# Patient Record
Sex: Female | Born: 1965
Health system: Southern US, Community
[De-identification: ages and names within clinical notes are randomized; demographics above are authoritative.]

## PROBLEM LIST (undated history)

## (undated) DIAGNOSIS — G4730 Sleep apnea, unspecified: Secondary | ICD-10-CM

## (undated) DIAGNOSIS — E119 Type 2 diabetes mellitus without complications: Secondary | ICD-10-CM

## (undated) DIAGNOSIS — F329 Major depressive disorder, single episode, unspecified: Secondary | ICD-10-CM

## (undated) DIAGNOSIS — I639 Cerebral infarction, unspecified: Secondary | ICD-10-CM

## (undated) DIAGNOSIS — Z8619 Personal history of other infectious and parasitic diseases: Secondary | ICD-10-CM

## (undated) DIAGNOSIS — B009 Herpesviral infection, unspecified: Secondary | ICD-10-CM

## (undated) DIAGNOSIS — F32A Depression, unspecified: Secondary | ICD-10-CM

## (undated) DIAGNOSIS — F419 Anxiety disorder, unspecified: Secondary | ICD-10-CM

## (undated) HISTORY — DX: Depression, unspecified: F32.A

## (undated) HISTORY — DX: Anxiety disorder, unspecified: F41.9

## (undated) HISTORY — PX: OTHER SURGICAL HISTORY: SHX169

## (undated) HISTORY — PX: FOOT SURGERY: SHX648

## (undated) HISTORY — DX: Major depressive disorder, single episode, unspecified: F32.9

## (undated) HISTORY — DX: Herpesviral infection, unspecified: B00.9

## (undated) HISTORY — DX: Sleep apnea, unspecified: G47.30

## (undated) HISTORY — PX: ABDOMINAL HYSTERECTOMY: SHX81

## (undated) HISTORY — DX: Personal history of other infectious and parasitic diseases: Z86.19

## (undated) HISTORY — PX: TOTAL ABDOMINAL HYSTERECTOMY: SHX209

---

## 2015-06-04 ENCOUNTER — Emergency Department (HOSPITAL_BASED_OUTPATIENT_CLINIC_OR_DEPARTMENT_OTHER)
Admission: EM | Admit: 2015-06-04 | Discharge: 2015-06-04 | Disposition: A | Discharge status: Other Acute Inpatient Hospital | Payer: Self-pay | Attending: Emergency Medicine | Admitting: Emergency Medicine

## 2015-06-04 ENCOUNTER — Emergency Department (HOSPITAL_BASED_OUTPATIENT_CLINIC_OR_DEPARTMENT_OTHER): Payer: Self-pay

## 2015-06-04 ENCOUNTER — Encounter (HOSPITAL_BASED_OUTPATIENT_CLINIC_OR_DEPARTMENT_OTHER): Payer: Self-pay | Admitting: Emergency Medicine

## 2015-06-04 DIAGNOSIS — Z79899 Other long term (current) drug therapy: Secondary | ICD-10-CM | POA: Insufficient documentation

## 2015-06-04 DIAGNOSIS — E1165 Type 2 diabetes mellitus with hyperglycemia: Secondary | ICD-10-CM | POA: Insufficient documentation

## 2015-06-04 DIAGNOSIS — Z87891 Personal history of nicotine dependence: Secondary | ICD-10-CM | POA: Insufficient documentation

## 2015-06-04 DIAGNOSIS — G459 Transient cerebral ischemic attack, unspecified: Secondary | ICD-10-CM | POA: Insufficient documentation

## 2015-06-04 DIAGNOSIS — K5900 Constipation, unspecified: Secondary | ICD-10-CM | POA: Insufficient documentation

## 2015-06-04 DIAGNOSIS — R739 Hyperglycemia, unspecified: Secondary | ICD-10-CM

## 2015-06-04 HISTORY — DX: Type 2 diabetes mellitus without complications: E11.9

## 2015-06-04 HISTORY — DX: Cerebral infarction, unspecified: I63.9

## 2015-06-04 LAB — COMPREHENSIVE METABOLIC PANEL
ALK PHOS: 79 U/L (ref 38–126)
ALT: 24 U/L (ref 14–54)
AST: 20 U/L (ref 15–41)
Albumin: 3.8 g/dL (ref 3.5–5.0)
Anion gap: 11 (ref 5–15)
BILIRUBIN TOTAL: 0.6 mg/dL (ref 0.3–1.2)
BUN: 24 mg/dL — ABNORMAL HIGH (ref 6–20)
CALCIUM: 9.9 mg/dL (ref 8.9–10.3)
CO2: 26 mmol/L (ref 22–32)
CREATININE: 1.04 mg/dL — AB (ref 0.44–1.00)
Chloride: 93 mmol/L — ABNORMAL LOW (ref 101–111)
GFR calc non Af Amer: >60 mL/min (ref >60)
Glucose, Bld: 440 mg/dL — ABNORMAL HIGH (ref 65–99)
Potassium: 5.1 mmol/L (ref 3.5–5.1)
SODIUM: 130 mmol/L — AB (ref 135–145)
TOTAL PROTEIN: 7.5 g/dL (ref 6.5–8.1)

## 2015-06-04 LAB — CBC
HEMATOCRIT: 37.1 % (ref 36.0–46.0)
HEMOGLOBIN: 11.8 g/dL — AB (ref 12.0–15.0)
MCH: 26.3 pg (ref 26.0–34.0)
MCHC: 31.8 g/dL (ref 30.0–36.0)
MCV: 82.6 fL (ref 78.0–100.0)
PLATELETS: 264 10*3/uL (ref 150–400)
RBC: 4.49 MIL/uL (ref 3.87–5.11)
RDW: 12.8 % (ref 11.5–15.5)
WBC: 7.9 10*3/uL (ref 4.0–10.5)

## 2015-06-04 LAB — URINALYSIS, ROUTINE W REFLEX MICROSCOPIC
Bilirubin Urine: NEGATIVE
Glucose, UA: >1000 mg/dL — AB
Hgb urine dipstick: NEGATIVE
KETONES UR: NEGATIVE mg/dL
NITRITE: NEGATIVE
PROTEIN: NEGATIVE mg/dL
Specific Gravity, Urine: 1.023 (ref 1.005–1.030)
Urobilinogen, UA: 0.2 mg/dL (ref 0.0–1.0)
pH: 6.5 (ref 5.0–8.0)

## 2015-06-04 LAB — RAPID URINE DRUG SCREEN, HOSP PERFORMED
Amphetamines: NOT DETECTED
BARBITURATES: NOT DETECTED
Benzodiazepines: NOT DETECTED
COCAINE: NOT DETECTED
Opiates: NOT DETECTED
Tetrahydrocannabinol: NOT DETECTED

## 2015-06-04 LAB — DIFFERENTIAL
Basophils Absolute: 0 10*3/uL (ref 0.0–0.1)
Basophils Relative: 0 % (ref 0–1)
EOS PCT: 1 % (ref 0–5)
Eosinophils Absolute: 0.1 10*3/uL (ref 0.0–0.7)
LYMPHS ABS: 3.1 10*3/uL (ref 0.7–4.0)
LYMPHS PCT: 39 % (ref 12–46)
MONO ABS: 0.5 10*3/uL (ref 0.1–1.0)
MONOS PCT: 7 % (ref 3–12)
NEUTROS ABS: 4.2 10*3/uL (ref 1.7–7.7)
Neutrophils Relative %: 53 % (ref 43–77)

## 2015-06-04 LAB — PROTIME-INR
INR: 0.83 (ref 0.00–1.49)
Prothrombin Time: 11.7 seconds (ref 11.6–15.2)

## 2015-06-04 LAB — TROPONIN I

## 2015-06-04 LAB — ETHANOL: Alcohol, Ethyl (B): <5 mg/dL (ref <5)

## 2015-06-04 LAB — URINE MICROSCOPIC-ADD ON

## 2015-06-04 LAB — APTT: aPTT: 22 seconds — ABNORMAL LOW (ref 24–37)

## 2015-06-04 MED ORDER — SODIUM CHLORIDE 0.9 % IV BOLUS (SEPSIS)
500.0000 mL | Freq: Once | INTRAVENOUS | Status: AC
  Administered 2015-06-04: 500 mL via INTRAVENOUS

## 2015-06-04 NOTE — ED Notes (Signed)
Left leg numbness and left sided HA x1 hr.  Nausea. Constipation x1 week.

## 2015-06-04 NOTE — ED Notes (Signed)
Patient transported to CT 

## 2015-06-04 NOTE — ED Provider Notes (Signed)
CSN: [id number]     Arrival date & time 06/04/15  1628 History   First MD Initiated Contact with Patient 06/04/15 1640     Chief Complaint  Patient presents with  . Numbness     (Consider location/radiation/quality/duration/timing/severity/associated sxs/prior Treatment) The history is provided by the patient.   patient presents with complaint of numbness. States she driven order mom's house and her right foot and lower leg felt numb. States she had some difficulty walking with it. States he does not feel like it was asleep. Did not hurt. No weakness of the leg but states she had difficulty walking. Also had a sharp headache at that time. States that it was on the left temporal area. It was sharp and lasted a minute or 2. Has been with more dull. States she's had a previous stroke that had some left-sided weakness. States she had a stroke because she is diabetic. Also states she has had constipation over the last week. States she's had some abdominal pain with it. No fevers. States she had a little chill when she tried to go to the bathroom. Also states she gets the feeling food get caught in her esophagus. No weight loss.  Past Medical History  Diagnosis Date  . Stroke   . Diabetes mellitus without complication    Past Surgical History  Procedure Laterality Date  . Abdominal hysterectomy    . Foot surgery     No family history on file. Social History  Substance Use Topics  . Smoking status: Former Research scientist (life sciences)  . Smokeless tobacco: None  . Alcohol Use: None     Comment: socially   OB History    No data available     Review of Systems  Constitutional: Negative for activity change and appetite change.  Eyes: Negative for pain.  Respiratory: Negative for chest tightness and shortness of breath.   Cardiovascular: Negative for chest pain and leg swelling.  Gastrointestinal: Positive for abdominal pain and constipation. Negative for nausea, vomiting and diarrhea.  Genitourinary:  Negative for flank pain.  Musculoskeletal: Negative for back pain and neck stiffness.  Skin: Negative for rash.  Neurological: Negative for weakness, numbness and headaches.  Psychiatric/Behavioral: Negative for behavioral problems.      Allergies  Review of patient's allergies indicates no known allergies.  Home Medications   Prior to Admission medications   Medication Sig Start Date End Date Taking? Authorizing Provider  gabapentin (NEURONTIN) 300 MG capsule Take 300 mg by mouth 3 (three) times daily.   Yes Historical Provider, MD  glipiZIDE (GLUCOTROL) 5 MG tablet Take by mouth daily before breakfast.   Yes Historical Provider, MD  lisinopril (PRINIVIL,ZESTRIL) 5 MG tablet Take 5 mg by mouth daily.   Yes Historical Provider, MD  magnesium 30 MG tablet Take 30 mg by mouth 2 (two) times daily.   Yes Historical Provider, MD  metFORMIN (GLUCOPHAGE) 1000 MG tablet Take 1,000 mg by mouth 2 (two) times daily with a meal.   Yes Historical Provider, MD  traZODone (DESYREL) 50 MG tablet Take 50 mg by mouth at bedtime.   Yes Historical Provider, MD   BP 142/76 mmHg  Pulse 102  Temp(Src) 98.2 F (36.8 C) (Oral)  Resp 16  Ht 5' 7.5" (1.715 m)  Wt 180 lb (81.647 kg)  BMI 27.76 kg/m2  SpO2 100% Physical Exam  Constitutional: She is oriented to person, place, and time. She appears well-developed and well-nourished.  HENT:  Head: Atraumatic.  Neck: Neck supple.  Cardiovascular:  Normal rate.   Pulmonary/Chest: Effort normal.  Abdominal: There is no tenderness.  Neurological: She is alert and oriented to person, place, and time. No cranial nerve deficit.  Finger-nose intact bilaterally. Face symmetric. External ocular movements intact. Good grip strength bilaterally. Good flexion-extension at hips knees and ankles. Some paresthesias to the left foot. Not clearly over a dermatomal distribution.  Skin: Skin is warm.  Psychiatric: She has a normal mood and affect.    ED Course  Procedures  (including critical care time) Labs Review Labs Reviewed  APTT - Abnormal; Notable for the following:    aPTT 22 (*)    All other components within normal limits  CBC - Abnormal; Notable for the following:    Hemoglobin 11.8 (*)    All other components within normal limits  COMPREHENSIVE METABOLIC PANEL - Abnormal; Notable for the following:    Sodium 130 (*)    Chloride 93 (*)    Glucose, Bld 440 (*)    BUN 24 (*)    Creatinine, Ser 1.04 (*)    All other components within normal limits  URINALYSIS, ROUTINE W REFLEX MICROSCOPIC (NOT AT [facility]) - Abnormal; Notable for the following:    APPearance CLOUDY (*)    Glucose, UA >1000 (*)    Leukocytes, UA SMALL (*)    All other components within normal limits  URINE MICROSCOPIC-ADD ON - Abnormal; Notable for the following:    Squamous Epithelial / LPF MANY (*)    All other components within normal limits  ETHANOL  PROTIME-INR  DIFFERENTIAL  URINE RAPID DRUG SCREEN, HOSP PERFORMED  TROPONIN I    Imaging Review Ct Head Wo Contrast  06/04/2015   CLINICAL DATA:  Left leg weakness.  EXAM: CT HEAD WITHOUT CONTRAST  TECHNIQUE: Contiguous axial images were obtained from the base of the skull through the vertex without intravenous contrast.  COMPARISON:  None.  FINDINGS: There is no evidence of mass effect, midline shift, or extra-axial fluid collections. There is no evidence of a space-occupying lesion or intracranial hemorrhage. There is no evidence of a cortical-based area of acute infarction. There is an old right frontal lobe infarct with encephalomalacia.  The ventricles and sulci are appropriate for the patient's age. The basal cisterns are patent.  Visualized portions of the orbits are unremarkable. The visualized portions of the paranasal sinuses and mastoid air cells are unremarkable. Cerebrovascular atherosclerotic calcifications are noted.  The osseous structures are unremarkable.  IMPRESSION: 1. No acute intracranial pathology. 2. Old  right frontal lobe infarct.   Electronically Signed   By: Kathreen Devoid   On: 06/04/2015 17:17   Dg Abd Acute W/chest  06/04/2015   CLINICAL DATA:  Constipation for 1 week.  Nausea, vomiting  EXAM: DG ABDOMEN ACUTE W/ 1V CHEST  COMPARISON:  None.  FINDINGS: There is no evidence of dilated bowel loops or free intraperitoneal air. No radiopaque calculi or other significant radiographic abnormality is seen. Heart size and mediastinal contours are within normal limits. Both lungs are clear.  IMPRESSION: Negative abdominal radiographs.  No acute cardiopulmonary disease.   Electronically Signed   By: Kathreen Devoid   On: 06/04/2015 17:25   I have personally reviewed and evaluated these images and lab results as part of my medical decision-making.   EKG Interpretation   Date/Time:  Wednesday June 04 2015 16:53:05 EDT Ventricular Rate:  108 PR Interval:  134 QRS Duration: 74 QT Interval:  328 QTC Calculation: 439 R Axis:   67 Text  Interpretation:  Sinus tachycardia Otherwise normal ECG Confirmed by  Alvino Chapel  MD, Ovid Curd [phone]) on 06/04/2015 11:32:47 PM      MDM   Final diagnoses:  Transient cerebral ischemia, unspecified transient cerebral ischemia type  Hyperglycemia  Constipation, unspecified constipation type    Patient with mild numbness. Somewhat variable exam. Has had previous stroke. Also had some constipation. Will transfer to [facility] for further evaluation.    Davonna Belling, MD 06/04/15 [phone]

## 2015-06-04 NOTE — ED Notes (Signed)
MD at bedside. 

## 2015-11-24 DIAGNOSIS — E1142 Type 2 diabetes mellitus with diabetic polyneuropathy: Secondary | ICD-10-CM | POA: Insufficient documentation

## 2015-11-24 DIAGNOSIS — E1165 Type 2 diabetes mellitus with hyperglycemia: Secondary | ICD-10-CM | POA: Insufficient documentation

## 2015-11-24 DIAGNOSIS — R000 Tachycardia, unspecified: Secondary | ICD-10-CM | POA: Insufficient documentation

## 2016-03-10 DIAGNOSIS — E1169 Type 2 diabetes mellitus with other specified complication: Secondary | ICD-10-CM | POA: Insufficient documentation

## 2016-03-18 DIAGNOSIS — E1142 Type 2 diabetes mellitus with diabetic polyneuropathy: Secondary | ICD-10-CM | POA: Insufficient documentation

## 2016-03-18 DIAGNOSIS — E1129 Type 2 diabetes mellitus with other diabetic kidney complication: Secondary | ICD-10-CM | POA: Insufficient documentation

## 2017-02-22 DIAGNOSIS — F411 Generalized anxiety disorder: Secondary | ICD-10-CM | POA: Insufficient documentation

## 2017-04-07 ENCOUNTER — Encounter (HOSPITAL_BASED_OUTPATIENT_CLINIC_OR_DEPARTMENT_OTHER): Payer: Self-pay

## 2017-04-07 ENCOUNTER — Emergency Department (HOSPITAL_BASED_OUTPATIENT_CLINIC_OR_DEPARTMENT_OTHER): Payer: Managed Care, Other (non HMO)

## 2017-04-07 ENCOUNTER — Emergency Department (HOSPITAL_BASED_OUTPATIENT_CLINIC_OR_DEPARTMENT_OTHER)
Admission: EM | Admit: 2017-04-07 | Discharge: 2017-04-07 | Disposition: A | Discharge status: Home / Self Care | Payer: Managed Care, Other (non HMO) | Attending: Emergency Medicine | Admitting: Emergency Medicine

## 2017-04-07 DIAGNOSIS — Z79899 Other long term (current) drug therapy: Secondary | ICD-10-CM | POA: Insufficient documentation

## 2017-04-07 DIAGNOSIS — E1165 Type 2 diabetes mellitus with hyperglycemia: Secondary | ICD-10-CM | POA: Insufficient documentation

## 2017-04-07 DIAGNOSIS — Z8673 Personal history of transient ischemic attack (TIA), and cerebral infarction without residual deficits: Secondary | ICD-10-CM | POA: Insufficient documentation

## 2017-04-07 DIAGNOSIS — R0602 Shortness of breath: Secondary | ICD-10-CM | POA: Insufficient documentation

## 2017-04-07 DIAGNOSIS — Z794 Long term (current) use of insulin: Secondary | ICD-10-CM | POA: Insufficient documentation

## 2017-04-07 DIAGNOSIS — Z87891 Personal history of nicotine dependence: Secondary | ICD-10-CM | POA: Diagnosis not present

## 2017-04-07 DIAGNOSIS — R000 Tachycardia, unspecified: Secondary | ICD-10-CM | POA: Diagnosis present

## 2017-04-07 DIAGNOSIS — R739 Hyperglycemia, unspecified: Secondary | ICD-10-CM

## 2017-04-07 LAB — CBC WITH DIFFERENTIAL/PLATELET
BASOS ABS: 0 10*3/uL (ref 0.0–0.1)
Basophils Relative: 0 %
EOS ABS: 0.1 10*3/uL (ref 0.0–0.7)
EOS PCT: 1 %
HCT: 33.9 % — ABNORMAL LOW (ref 36.0–46.0)
Hemoglobin: 11.1 g/dL — ABNORMAL LOW (ref 12.0–15.0)
Lymphocytes Relative: 32 %
Lymphs Abs: 2.4 10*3/uL (ref 0.7–4.0)
MCH: 26.3 pg (ref 26.0–34.0)
MCHC: 32.7 g/dL (ref 30.0–36.0)
MCV: 80.3 fL (ref 78.0–100.0)
MONO ABS: 0.4 10*3/uL (ref 0.1–1.0)
Monocytes Relative: 5 %
Neutro Abs: 4.6 10*3/uL (ref 1.7–7.7)
Neutrophils Relative %: 62 %
PLATELETS: 301 10*3/uL (ref 150–400)
RBC: 4.22 MIL/uL (ref 3.87–5.11)
RDW: 14.2 % (ref 11.5–15.5)
WBC: 7.5 10*3/uL (ref 4.0–10.5)

## 2017-04-07 LAB — TROPONIN I

## 2017-04-07 LAB — CBG MONITORING, ED
GLUCOSE-CAPILLARY: 207 mg/dL — AB (ref 65–99)
Glucose-Capillary: 221 mg/dL — ABNORMAL HIGH (ref 65–99)

## 2017-04-07 LAB — D-DIMER, QUANTITATIVE: D-Dimer, Quant: 0.38 ug/mL-FEU (ref 0.00–0.50)

## 2017-04-07 MED ORDER — SODIUM CHLORIDE 0.9 % IV BOLUS (SEPSIS)
1000.0000 mL | Freq: Once | INTRAVENOUS | Status: AC
  Administered 2017-04-07: 1000 mL via INTRAVENOUS

## 2017-04-07 MED ORDER — IOPAMIDOL (ISOVUE-370) INJECTION 76%
100.0000 mL | Freq: Once | INTRAVENOUS | Status: AC | PRN
  Administered 2017-04-07: 100 mL via INTRAVENOUS

## 2017-04-07 MED ORDER — INSULIN ASPART 100 UNIT/ML ~~LOC~~ SOLN
10.0000 [IU] | Freq: Once | SUBCUTANEOUS | Status: AC
  Administered 2017-04-07: 10 [IU] via INTRAVENOUS
  Filled 2017-04-07: qty 1

## 2017-04-07 MED ORDER — LACTATED RINGERS IV BOLUS (SEPSIS): 1000.0000 mL | Freq: Once | INTRAVENOUS | Status: DC

## 2017-04-07 NOTE — ED Provider Notes (Signed)
[facility] Provider Note   CSN: [id number] Arrival date & time: 04/07/17  1336     History   Chief Complaint Chief Complaint  Patient presents with  . Tachycardia    HPI Rindy Roseman is a 51 y.o. female.  Patient is a 51 year old female with past medical history of diabetes, stroke. She presents for evaluation of elevated heart rate and shortness of breath. She states that she noticed her heart racing several days ago and has not improved. She becomes short of breath when she walks or becomes excited. She denies any fevers, chills, or productive cough. She denies any significant leg swelling.   The history is provided by the patient.    Past Medical History:  Diagnosis Date  . Diabetes mellitus without complication ([place])   . Stroke [facility])     There are no active problems to display for this patient.   Past Surgical History:  Procedure Laterality Date  . ABDOMINAL HYSTERECTOMY    . FOOT SURGERY      OB History    No data available       Home Medications    Prior to Admission medications   Medication Sig Start Date End Date Taking? Authorizing Provider  Insulin Aspart (NOVOLOG [place]) Inject into the skin.   Yes [provider]  Insulin Glargine (TOUJEO SOLOSTAR [place]) Inject into the skin.   Yes [provider]  gabapentin (NEURONTIN) 300 MG capsule Take 300 mg by mouth 3 (three) times daily.    [provider]  lisinopril (PRINIVIL,ZESTRIL) 5 MG tablet Take 5 mg by mouth daily.    [provider]  traZODone (DESYREL) 50 MG tablet Take 50 mg by mouth at bedtime.    [provider]    Family History No family history on file.  Social History Social History  Substance Use Topics  . Smoking status: Former Research scientist (life sciences)  . Smokeless tobacco: Never Used  . Alcohol use Yes     Comment: occ     Allergies   Latex   Review of Systems Review of Systems  All other systems reviewed and are  negative.    Physical Exam Updated Vital Signs BP 103/78 (BP Location: Left Arm)   Pulse (!) 124   Temp 98.1 F (36.7 C) (Oral)   Resp 18   Ht 5\' 8"  (1.727 m)   Wt 97.1 kg (214 lb)   SpO2 97%   BMI 32.54 kg/m   Physical Exam  Constitutional: She is oriented to person, place, and time. She appears well-developed and well-nourished. No distress.  HENT:  Head: Normocephalic and atraumatic.  Neck: Normal range of motion. Neck supple.  Cardiovascular: Regular rhythm and normal heart sounds.  Exam reveals no gallop and no friction rub.   No murmur heard. Heart is regular but tachycardic.  Pulmonary/Chest: Effort normal and breath sounds normal. No respiratory distress. She has no wheezes. She has no rales.  Abdominal: Soft. Bowel sounds are normal. She exhibits no distension. There is no tenderness.  Musculoskeletal: Normal range of motion.  Neurological: She is alert and oriented to person, place, and time.  Skin: Skin is warm and dry. She is not diaphoretic.  Nursing note and vitals reviewed.    ED Treatments / Results  Labs (all labs ordered are listed, but only abnormal results are displayed) Labs Reviewed  COMPREHENSIVE METABOLIC PANEL  CBC WITH DIFFERENTIAL/PLATELET  TROPONIN I  D-DIMER, QUANTITATIVE (NOT AT [facility])    EKG  EKG Interpretation  Date/Time:  Thursday April 07 2017 13:43:08 EDT Ventricular Rate:  125 PR Interval:  136 QRS Duration: 74 QT Interval:  312 QTC Calculation: 450 R Axis:   61 Text Interpretation:  Sinus tachycardia Possible Left atrial enlargement Cannot rule out Anterior infarct , age undetermined Abnormal ECG Confirmed by Veryl Speak [phone]) on 04/07/2017 1:54:09 PM       Radiology No results found.  Procedures Procedures (including critical care time)  Medications Ordered in ED Medications  sodium chloride 0.9 % bolus 1,000 mL (not administered)     Initial Impression / Assessment and Plan / ED Course  I have reviewed the  triage vital signs and the nursing notes.  Pertinent labs & imaging results that were available during my care of the patient were reviewed by me and considered in my medical decision making (see chart for details).  Patient presents with elevated heart rate and shortness of breath that has been worsening over the past several days. Her workup thus far reveals a mildly elevated d-dimer and blood sugar that is significantly elevated with no evidence for DKA. She will be given IV fluids and insulin, and will undergo a CT angiogram of the chest to rule out pulmonary embolism.  Care will be signed out at the time of this dictation to Dr. Dayna Barker. He will obtain the results of the CT scan and determine the final disposition.   Final Clinical Impressions(s) / ED Diagnoses   Final diagnoses:  None    New Prescriptions New Prescriptions   No medications on file     Veryl Speak, MD 04/18/17 [phone]

## 2017-04-07 NOTE — ED Notes (Signed)
Pt verbalizes understanding of d/c instructions and denies any further needs at this time. 

## 2017-04-07 NOTE — ED Notes (Signed)
Critical lab result of glucose >500 reported to primary rn and md

## 2017-04-07 NOTE — ED Triage Notes (Signed)
C/o HR 130s and SOB x 1 week-hx of same with no dx-pt NAD-entered triage with steady gait chewing gum

## 2017-04-08 LAB — COMPREHENSIVE METABOLIC PANEL
ALK PHOS: 195 U/L — AB (ref 38–126)
ALT: 41 U/L (ref 14–54)
AST: 36 U/L (ref 15–41)
Albumin: 3.4 g/dL — ABNORMAL LOW (ref 3.5–5.0)
Anion gap: 10 (ref 5–15)
BILIRUBIN TOTAL: 0.8 mg/dL (ref 0.3–1.2)
BUN: 22 mg/dL — AB (ref 6–20)
CO2: 25 mmol/L (ref 22–32)
CREATININE: 1.2 mg/dL — AB (ref 0.44–1.00)
Calcium: 8.9 mg/dL (ref 8.9–10.3)
Chloride: 94 mmol/L — ABNORMAL LOW (ref 101–111)
GFR calc Af Amer: 59 mL/min — ABNORMAL LOW (ref >60)
GFR, EST NON AFRICAN AMERICAN: 51 mL/min — AB (ref >60)
GLUCOSE: 517 mg/dL — AB (ref 65–99)
Potassium: 5 mmol/L (ref 3.5–5.1)
Sodium: 129 mmol/L — ABNORMAL LOW (ref 135–145)
TOTAL PROTEIN: 7.3 g/dL (ref 6.5–8.1)

## 2017-04-09 NOTE — ED Provider Notes (Signed)
7:03 AM Assumed care from Dr. Stark Jock, please see their note for full history, physical and decision making until this point. In brief this is a 52 y.o. year old female who presented to the ED tonight with Tachycardia     Here with hyperglycemia and tachycardia likely 2/2 dehydration, pending hydration and cta and otherwise stable for dc.   HR improved. Glucose improved. CT ok. Patient without complaints still slightly tachy but no other symptoms at this time.   Discharge instructions, including strict return precautions for new or worsening symptoms, given. Patient and/or family verbalized understanding and agreement with the plan as described.   Labs, studies and imaging reviewed by myself and considered in medical decision making if ordered. Imaging interpreted by radiology.  Labs Reviewed  COMPREHENSIVE METABOLIC PANEL - Abnormal; Notable for the following:       Result Value   Sodium 129 (*)    Chloride 94 (*)    Glucose, Bld 517 (*)    BUN 22 (*)    Creatinine, Ser 1.20 (*)    Albumin 3.4 (*)    Alkaline Phosphatase 195 (*)    GFR calc non Af Amer 51 (*)    GFR calc Af Amer 59 (*)    All other components within normal limits  CBC WITH DIFFERENTIAL/PLATELET - Abnormal; Notable for the following:    Hemoglobin 11.1 (*)    HCT 33.9 (*)    All other components within normal limits  CBG MONITORING, ED - Abnormal; Notable for the following:    Glucose-Capillary 221 (*)    All other components within normal limits  CBG MONITORING, ED - Abnormal; Notable for the following:    Glucose-Capillary 207 (*)    All other components within normal limits  TROPONIN I  D-DIMER, QUANTITATIVE (NOT AT [facility])    CT Angio Chest PE W and/or Wo Contrast  Final Result      No Follow-up on file.    Velinda Wrobel, Corene Cornea, MD 04/09/17 [phone]

## 2017-04-11 DIAGNOSIS — R748 Abnormal levels of other serum enzymes: Secondary | ICD-10-CM | POA: Insufficient documentation

## 2017-04-11 DIAGNOSIS — D638 Anemia in other chronic diseases classified elsewhere: Secondary | ICD-10-CM | POA: Insufficient documentation

## 2017-05-16 DIAGNOSIS — R072 Precordial pain: Secondary | ICD-10-CM | POA: Insufficient documentation

## 2017-05-31 DIAGNOSIS — K760 Fatty (change of) liver, not elsewhere classified: Secondary | ICD-10-CM | POA: Insufficient documentation

## 2017-07-11 ENCOUNTER — Encounter: Payer: Self-pay | Admitting: Gastroenterology

## 2017-07-12 ENCOUNTER — Encounter (HOSPITAL_BASED_OUTPATIENT_CLINIC_OR_DEPARTMENT_OTHER): Payer: Self-pay | Admitting: *Deleted

## 2017-07-12 ENCOUNTER — Emergency Department (HOSPITAL_BASED_OUTPATIENT_CLINIC_OR_DEPARTMENT_OTHER)
Admission: EM | Admit: 2017-07-12 | Discharge: 2017-07-12 | Disposition: A | Discharge status: Home / Self Care | Payer: Managed Care, Other (non HMO) | Attending: Emergency Medicine | Admitting: Emergency Medicine

## 2017-07-12 DIAGNOSIS — E119 Type 2 diabetes mellitus without complications: Secondary | ICD-10-CM | POA: Insufficient documentation

## 2017-07-12 DIAGNOSIS — E860 Dehydration: Secondary | ICD-10-CM | POA: Diagnosis not present

## 2017-07-12 DIAGNOSIS — R197 Diarrhea, unspecified: Secondary | ICD-10-CM | POA: Insufficient documentation

## 2017-07-12 DIAGNOSIS — Z91040 Latex allergy status: Secondary | ICD-10-CM | POA: Insufficient documentation

## 2017-07-12 LAB — CBC WITH DIFFERENTIAL/PLATELET
Basophils Absolute: 0 10*3/uL (ref 0.0–0.1)
Basophils Relative: 0 %
Eosinophils Absolute: 0.1 10*3/uL (ref 0.0–0.7)
Eosinophils Relative: 1 %
HEMATOCRIT: 33.3 % — AB (ref 36.0–46.0)
Hemoglobin: 10.3 g/dL — ABNORMAL LOW (ref 12.0–15.0)
LYMPHS ABS: 2.4 10*3/uL (ref 0.7–4.0)
Lymphocytes Relative: 32 %
MCH: 25.8 pg — ABNORMAL LOW (ref 26.0–34.0)
MCHC: 30.9 g/dL (ref 30.0–36.0)
MCV: 83.3 fL (ref 78.0–100.0)
MONOS PCT: 5 %
Monocytes Absolute: 0.4 10*3/uL (ref 0.1–1.0)
NEUTROS ABS: 4.8 10*3/uL (ref 1.7–7.7)
NEUTROS PCT: 62 %
Platelets: 346 10*3/uL (ref 150–400)
RBC: 4 MIL/uL (ref 3.87–5.11)
RDW: 14.8 % (ref 11.5–15.5)
WBC: 7.6 10*3/uL (ref 4.0–10.5)

## 2017-07-12 LAB — COMPREHENSIVE METABOLIC PANEL
ALT: 25 U/L (ref 14–54)
ANION GAP: 5 (ref 5–15)
AST: 25 U/L (ref 15–41)
Albumin: 3.5 g/dL (ref 3.5–5.0)
Alkaline Phosphatase: 137 U/L — ABNORMAL HIGH (ref 38–126)
BILIRUBIN TOTAL: 0.4 mg/dL (ref 0.3–1.2)
BUN: 19 mg/dL (ref 6–20)
CO2: 25 mmol/L (ref 22–32)
Calcium: 8.7 mg/dL — ABNORMAL LOW (ref 8.9–10.3)
Chloride: 106 mmol/L (ref 101–111)
Creatinine, Ser: 1.08 mg/dL — ABNORMAL HIGH (ref 0.44–1.00)
GFR, EST NON AFRICAN AMERICAN: 58 mL/min — AB (ref >60)
Glucose, Bld: 72 mg/dL (ref 65–99)
POTASSIUM: 3.8 mmol/L (ref 3.5–5.1)
Sodium: 136 mmol/L (ref 135–145)
TOTAL PROTEIN: 7.7 g/dL (ref 6.5–8.1)

## 2017-07-12 LAB — LIPASE, BLOOD: LIPASE: 42 U/L (ref 11–51)

## 2017-07-12 MED ORDER — DICYCLOMINE HCL 20 MG PO TABS: 20.0000 mg | ORAL_TABLET | Freq: Two times a day (BID) | ORAL | 0 refills | Status: DC

## 2017-07-12 MED ORDER — LOPERAMIDE HCL 2 MG PO CAPS: 2.0000 mg | ORAL_CAPSULE | Freq: Four times a day (QID) | ORAL | 0 refills | Status: DC | PRN

## 2017-07-12 MED FILL — SM ANTI-DIARRHEAL 2 MG CAPL: 2 | 12 days supply | Qty: 48 | Fill #0

## 2017-07-12 NOTE — ED Notes (Signed)
ED Provider at bedside. 

## 2017-07-12 NOTE — ED Notes (Signed)
Pt stated that her abdominal bloating gets worst and she felt like stabbing al-over her body.  Her  Lower back also hurts since her abdomen becomes bigger.

## 2017-07-12 NOTE — ED Triage Notes (Signed)
Abdominal bloating and diarrhea x 2 months. She is being seen by numerous doctors and they cannot find a cause.

## 2017-07-12 NOTE — ED Provider Notes (Signed)
Emergency Department Provider Note   I have reviewed the triage vital signs and the nursing notes.   HISTORY  Chief Complaint Diarrhea   HPI Helen Perez is a 51 y.o. female with PMH of DM and CVA presents to the emergency department for evaluation of worsening abdominal bloating with associated diarrhea. Symptoms have been ongoing for the past 2 months. Patient states that she has seen multiple prior physicians with no definitive diagnosis made. She reports her most recent colonoscopy in March of this year which was prior to symptom onset. She also had an upper endoscopy around the same time. She reports presumed normal CT abdomen/pelvis and stool cultures performed by her PCP. She describes one to 3 episodes of nonbloody diarrhea daily with associated sharp pains on the right side of her abdomen. She has some associated discomfort in the lower abdomen. No fevers or chills. Denies any vaginal bleeding or discharge. No dysuria, hesitancy, urgency. Denies sudden worsening of symptoms today but states she was scheduled to see a provider at [facility] but they called today to cancel and reschedule for December.    Past Medical History:  Diagnosis Date  . Diabetes mellitus without complication ([place])   . Stroke [facility])     There are no active problems to display for this patient.   Past Surgical History:  Procedure Laterality Date  . ABDOMINAL HYSTERECTOMY    . FOOT SURGERY      Current Outpatient Rx  . Order #: [id number] Class: Historical Med  . Order #: [id number] Class: Historical Med  . Order #: [id number] Class: Historical Med  . Order #: [id number] Class: Historical Med  . Order #: [id number] Class: Historical Med  . Order #: [id number] Class: Historical Med  . Order #: [id number] Class: Historical Med  . Order #: [id number] Class: Historical Med  . Order #: [id number] Class: Historical Med  . Order #: [id number] Class: Historical Med  . Order #: [id number] Class: Print  . Order #:  [id number] Class: Historical Med  . Order #: [id number] Class: Print    Allergies Latex  No family history on file.  Social History Social History  Substance Use Topics  . Smoking status: Former Research scientist (life sciences)  . Smokeless tobacco: Never Used  . Alcohol use Yes     Comment: occ    Review of Systems  Constitutional: No fever/chills Eyes: No visual changes. ENT: No sore throat. Cardiovascular: Denies chest pain. Respiratory: Denies shortness of breath. Gastrointestinal: Positive right abdominal pain.  No nausea, no vomiting. Positive diarrhea.  No constipation. Genitourinary: Negative for dysuria. Musculoskeletal: Negative for back pain. Skin: Negative for rash. Neurological: Negative for headaches, focal weakness or numbness.  10-point ROS otherwise negative.  ____________________________________________   PHYSICAL EXAM:  VITAL SIGNS: ED Triage Vitals  Enc Vitals Group     BP 07/12/17 1329 128/77     Pulse Rate 07/12/17 1329 (!) 109     Resp 07/12/17 1329 18     Temp 07/12/17 1329 98.7 F (37.1 C)     Temp Source 07/12/17 1329 Oral     SpO2 07/12/17 1329 98 %     Weight 07/12/17 1324 210 lb (95.3 kg)     Height 07/12/17 1324 5\' 8"  (1.727 m)     Pain Score 07/12/17 1323 7   Constitutional: Alert and oriented. Well appearing and in no acute distress. Eyes: Conjunctivae are normal.  Head: Atraumatic. Nose: No congestion/rhinnorhea. Mouth/Throat: Mucous membranes are moist.   Neck: No stridor.  Cardiovascular: Tachycardia. Good peripheral circulation. Grossly normal heart sounds.  Respiratory: Normal respiratory effort.  No retractions. Lungs CTAB. Gastrointestinal: Soft and nontender. No distention.  Musculoskeletal: No lower extremity tenderness nor edema. No gross deformities of extremities. Neurologic:  Normal speech and language. No gross focal neurologic deficits are appreciated.  Skin:  Skin is warm, dry and intact. No rash  noted.   ____________________________________________   LABS (all labs ordered are listed, but only abnormal results are displayed)  Labs Reviewed  CBC WITH DIFFERENTIAL/PLATELET - Abnormal; Notable for the following:       Result Value   Hemoglobin 10.3 (*)    HCT 33.3 (*)    MCH 25.8 (*)    All other components within normal limits  COMPREHENSIVE METABOLIC PANEL - Abnormal; Notable for the following:    Creatinine, Ser 1.08 (*)    Calcium 8.7 (*)    Alkaline Phosphatase 137 (*)    GFR calc non Af Amer 58 (*)    All other components within normal limits  LIPASE, BLOOD    ____________________________________________   PROCEDURES  Procedure(s) performed:   Procedures  None ____________________________________________   INITIAL IMPRESSION / ASSESSMENT AND PLAN / ED COURSE  Pertinent labs & imaging results that were available during my care of the patient were reviewed by me and considered in my medical decision making (see chart for details).  Patient resents to the emergency department for evaluation of abdominal bloating with ongoing diarrhea for the past 2 months. Symptoms are now worse today but she was scheduled to see an outpatient provider when that appointment was canceled suddenly she began to feel frustrated by ongoing symptoms and so presented to the emergency department. Patient has had an extensive outpatient workup to this point and has been on medication for IBS with no relief. Reports trying Immodium in the past with no improvement. Abdomen is soft and diffusely nontender. She has no signs or symptoms to suggest acute small bowel obstruction. Plan for labs to screen for electrolyte abnormality and reassess.   Electrolytes unremarkable. Plan for GI and PCP follow up. Discharged with Rx for Bentyl and plan to re-start immodium.   At this time, I do not feel there is any life-threatening condition present. I have reviewed and discussed all results (EKG,  imaging, lab, urine as appropriate), exam findings with patient. I have reviewed nursing notes and appropriate previous records.  I feel the patient is safe to be discharged home without further emergent workup. Discussed usual and customary return precautions. Patient and family (if present) verbalize understanding and are comfortable with this plan.  Patient will follow-up with their primary care provider. If they do not have a primary care provider, information for follow-up has been provided to them. All questions have been answered.  ____________________________________________  FINAL CLINICAL IMPRESSION(S) / ED DIAGNOSES  Final diagnoses:  Diarrhea, unspecified type  Dehydration     MEDICATIONS GIVEN DURING THIS VISIT:  Medications - No data to display   NEW OUTPATIENT MEDICATIONS STARTED DURING THIS VISIT:  Discharge Medication List as of 07/12/2017  3:15 PM    START taking these medications   Details  dicyclomine (BENTYL) 20 MG tablet Take 1 tablet (20 mg total) by mouth 2 (two) times daily., Starting Tue 07/12/2017, Print    loperamide (IMODIUM) 2 MG capsule Take 1 capsule (2 mg total) by mouth 4 (four) times daily as needed for diarrhea or loose stools., Starting Tue 07/12/2017, Print        Note:  This document was prepared using Dragon voice recognition  software and may include unintentional dictation errors.  Nanda Quinton, MD Emergency Medicine    Amaree Loisel, [facility], MD 07/13/17 1154

## 2017-07-12 NOTE — Discharge Instructions (Signed)
You were seen in the ED today with continued diarrhea. We encourage you to take over-the-counter loperamide (Imodium) according to the label instructions.  Please also read through the information below about foods that are rich in potassium and foods that can help with diarrhea.  We encourage you to drink plenty of fluids (water or Gatorade) and eat according to the instruction recommendations to help resolve your symptoms.  Return to the emergency department with new or worsening symptoms that concern you.   Diarrhea Diarrhea is frequent loose and watery bowel movements. It can cause you to feel weak and dehydrated. Dehydration can cause you to become tired and thirsty, have a dry mouth, and have decreased urination that often is dark yellow. Diarrhea is a sign of another problem, most often an infection that will not last Helen Perez. In most cases, diarrhea typically lasts 2-3 days. However, it can last longer if it is a sign of something more serious. It is important to treat your diarrhea as directed by your caregiver to lessen or prevent future episodes of diarrhea. CAUSES  Some common causes include: Gastrointestinal infections caused by viruses, bacteria, or parasites. Food poisoning or food allergies. Certain medicines, such as antibiotics, chemotherapy, and laxatives. Artificial sweeteners and fructose. Digestive disorders. HOME CARE INSTRUCTIONS Ensure adequate fluid intake (hydration): Have 1 cup (8 oz) of fluid for each diarrhea episode. Avoid fluids that contain simple sugars or sports drinks, fruit juices, whole milk products, and sodas. Your urine should be clear or pale yellow if you are drinking enough fluids. Hydrate with an oral rehydration solution that you can purchase at pharmacies, retail stores, and online. You can prepare an oral rehydration solution at home by mixing the following ingredients together:  - tsp table salt.  tsp baking soda.  tsp salt substitute containing  potassium chloride. 1  tablespoons sugar. 1 L (34 oz) of water. Certain foods and beverages may increase the speed at which food moves through the gastrointestinal (GI) tract. These foods and beverages should be avoided and include: Caffeinated and alcoholic beverages. High-fiber foods, such as raw fruits and vegetables, nuts, seeds, and whole grain breads and cereals. Foods and beverages sweetened with sugar alcohols, such as xylitol, sorbitol, and mannitol. Some foods may be well tolerated and may help thicken stool including: Starchy foods, such as rice, toast, pasta, low-sugar cereal, oatmeal, grits, baked potatoes, crackers, and bagels. Bananas. Applesauce. Add probiotic-rich foods to help increase healthy bacteria in the GI tract, such as yogurt and fermented milk products. Wash your hands well after each diarrhea episode. Only take over-the-counter or prescription medicines as directed by your caregiver. Take a warm bath to relieve any burning or pain from frequent diarrhea episodes. SEEK IMMEDIATE MEDICAL CARE IF:  You are unable to keep fluids down. You have persistent vomiting. You have blood in your stool, or your stools are black and tarry. You do not urinate in 6-8 hours, or there is only a small amount of very dark urine. You have abdominal pain that increases or localizes. You have weakness, dizziness, confusion, or light-headedness. You have a severe headache. Your diarrhea gets worse or does not get better. You have a fever or persistent symptoms for more than 2-3 days. You have a fever and your symptoms suddenly get worse. MAKE SURE YOU:  Understand these instructions. Will watch your condition. Will get help right away if you are not doing well or get worse.   This information is not intended to replace advice given to  you by your health care provider. Make sure you discuss any questions you have with your health care provider.   Document Released: 09/24/2002  Document Revised: 10/25/2014 Document Reviewed: 06/11/2012 Elsevier Interactive Patient Education 2016 [place] Choices to Help Relieve Diarrhea, Adult When you have diarrhea, the foods you eat and your eating habits are very important. Choosing the right foods and drinks can help relieve diarrhea. Also, because diarrhea can last up to 7 days, you need to replace lost fluids and electrolytes (such as sodium, potassium, and chloride) in order to help prevent dehydration.  WHAT GENERAL GUIDELINES DO I NEED TO FOLLOW? Slowly drink 1 cup (8 oz) of fluid for each episode of diarrhea. If you are getting enough fluid, your urine will be clear or pale yellow. Eat starchy foods. Some good choices include white rice, white toast, pasta, low-fiber cereal, baked potatoes (without the skin), saltine crackers, and bagels. Avoid large servings of any cooked vegetables. Limit fruit to two servings per day. A serving is  cup or 1 small piece. Choose foods with less than 2 g of fiber per serving. Limit fats to less than 8 tsp (38 g) per day. Avoid fried foods. Eat foods that have probiotics in them. Probiotics can be found in certain dairy products. Avoid foods and beverages that may increase the speed at which food moves through the stomach and intestines (gastrointestinal tract). Things to avoid include: High-fiber foods, such as dried fruit, raw fruits and vegetables, nuts, seeds, and whole grain foods. Spicy foods and high-fat foods. Foods and beverages sweetened with high-fructose corn syrup, honey, or sugar alcohols such as xylitol, sorbitol, and mannitol. WHAT FOODS ARE RECOMMENDED? Grains White rice. White, [place], or pita breads (fresh or toasted), including plain rolls, buns, or bagels. White pasta. Saltine, soda, or graham crackers. Pretzels. Low-fiber cereal. Cooked cereals made with water (such as cornmeal, farina, or cream cereals). Plain muffins. Matzo. Melba toast. Zwieback.    Vegetables Potatoes (without the skin). Strained tomato and vegetable juices. Most well-cooked and canned vegetables without seeds. Tender lettuce. Fruits Cooked or canned applesauce, apricots, cherries, fruit cocktail, grapefruit, peaches, pears, or plums. Fresh bananas, apples without skin, cherries, grapes, cantaloupe, grapefruit, peaches, oranges, or plums.  Meat and Other Protein Products Baked or boiled chicken. Eggs. Tofu. Fish. Seafood. Smooth peanut butter. Ground or well-cooked tender beef, ham, veal, lamb, pork, or poultry.  Dairy Plain yogurt, kefir, and unsweetened liquid yogurt. Lactose-free milk, buttermilk, or soy milk. Plain hard cheese. Beverages Sport drinks. Clear broths. Diluted fruit juices (except prune). Regular, caffeine-free sodas such as ginger ale. Water. Decaffeinated teas. Oral rehydration solutions. Sugar-free beverages not sweetened with sugar alcohols. Other Bouillon, broth, or soups made from recommended foods.  The items listed above may not be a complete list of recommended foods or beverages. Contact your dietitian for more options. WHAT FOODS ARE NOT RECOMMENDED? Grains Whole grain, whole wheat, bran, or rye breads, rolls, pastas, crackers, and cereals. Wild or brown rice. Cereals that contain more than 2 g of fiber per serving. Corn tortillas or taco shells. Cooked or dry oatmeal. Granola. Popcorn. Vegetables Raw vegetables. Cabbage, broccoli, Brussels sprouts, artichokes, baked beans, beet greens, corn, kale, legumes, peas, sweet potatoes, and yams. Potato skins. Cooked spinach and cabbage. Fruits Dried fruit, including raisins and dates. Raw fruits. Stewed or dried prunes. Fresh apples with skin, apricots, mangoes, pears, raspberries, and strawberries.  Meat and Other Protein Products Chunky peanut butter. Nuts and seeds. Beans and lentils. Berniece Salines.  Dairy High-fat cheeses. Milk, chocolate milk, and beverages made with milk, such as milk shakes. Cream.  Ice cream. Sweets and Desserts Sweet rolls, doughnuts, and sweet breads. Pancakes and waffles. Fats and Oils Butter. Cream sauces. Margarine. Salad oils. Plain salad dressings. Olives. Avocados.  Beverages Caffeinated beverages (such as coffee, tea, soda, or energy drinks). Alcoholic beverages. Fruit juices with pulp. Prune juice. Soft drinks sweetened with high-fructose corn syrup or sugar alcohols. Other Coconut. Hot sauce. Chili powder. Mayonnaise. Gravy. Cream-based or milk-based soups.  The items listed above may not be a complete list of foods and beverages to avoid. Contact your dietitian for more information. WHAT SHOULD I DO IF I BECOME DEHYDRATED? Diarrhea can sometimes lead to dehydration. Signs of dehydration include dark urine and dry mouth and skin. If you think you are dehydrated, you should rehydrate with an oral rehydration solution. These solutions can be purchased at pharmacies, retail stores, or online.  Drink -1 cup (120-240 mL) of oral rehydration solution each time you have an episode of diarrhea. If drinking this amount makes your diarrhea worse, try drinking smaller amounts more often. For example, drink 1-3 tsp (5-15 mL) every 5-10 minutes.  A general rule for staying hydrated is to drink 1-2 L of fluid per day. Talk to your health care provider about the specific amount you should be drinking each day. Drink enough fluids to keep your urine clear or pale yellow.   This information is not intended to replace advice given to you by your health care provider. Make sure you discuss any questions you have with your health care provider.   Document Released: 12/25/2003 Document Revised: 10/25/2014 Document Reviewed: 08/27/2013 Elsevier Interactive Patient Education 2016 [place].  Potassium Content of Foods Potassium is a mineral found in many foods and drinks. It helps keep fluids and minerals balanced in your body and affects how steadily your heart beats. Potassium  also helps control your blood pressure and keep your muscles and nervous system healthy. Certain health conditions and medicines may change the balance of potassium in your body. When this happens, you can help balance your level of potassium through the foods that you do or do not eat. Your health care provider or dietitian may recommend an amount of potassium that you should have each day. The following lists of foods provide the amount of potassium (in parentheses) per serving in each item. HIGH IN POTASSIUM  The following foods and beverages have 200 mg or more of potassium per serving: Apricots, 2 raw or 5 dry (200 mg). Artichoke, 1 medium (345 mg). Avocado, raw,   each (245 mg). Banana, 1 medium (425 mg). Beans, lima, or baked beans, canned,  cup (280 mg). Beans, white, canned,  cup (595 mg). Beef roast, 3 oz (320 mg). Beef, ground, 3 oz (270 mg). Beets, raw or cooked,  cup (260 mg). Bran muffin, 2 oz (300 mg). Broccoli,  cup (230 mg). Brussels sprouts,  cup (250 mg). Cantaloupe,  cup (215 mg). Cereal, 100% bran,  cup (200-400 mg). Cheeseburger, single, fast food, 1 each (225-400 mg). Chicken, 3 oz (220 mg). Clams, canned, 3 oz (535 mg). Crab, 3 oz (225 mg). Dates, 5 each (270 mg). Dried beans and peas,  cup (300-475 mg). Figs, dried, 2 each (260 mg). Fish: halibut, tuna, cod, snapper, 3 oz (480 mg). Fish: salmon, haddock, swordfish, perch, 3 oz (300 mg). Fish, tuna, canned 3 oz (200 mg). [place] fries, fast food, 3 oz (470 mg). Granola with fruit  and nuts,  cup (200 mg). Grapefruit juice,  cup (200 mg). Greens, beet,  cup (655 mg). Honeydew melon,  cup (200 mg). Kale, raw, 1 cup (300 mg). Kiwi, 1 medium (240 mg). Kohlrabi, rutabaga, parsnips,  cup (280 mg). Lentils,  cup (365 mg). Mango, 1 each (325 mg). Milk, chocolate, 1 cup (420 mg). Milk: nonfat, low-fat, whole, buttermilk, 1 cup (350-380 mg). Molasses, 1 Tbsp (295 mg). Mushrooms,  cup (280)  mg. Nectarine, 1 each (275 mg). Nuts: almonds, peanuts, hazelnuts, [place], cashew, mixed, 1 oz (200 mg). Nuts, pistachios, 1 oz (295 mg). Orange, 1 each (240 mg). Orange juice,  cup (235 mg). Papaya, medium,  fruit (390 mg). Peanut butter, chunky, 2 Tbsp (240 mg). Peanut butter, smooth, 2 Tbsp (210 mg). Pear, 1 medium (200 mg). Pomegranate, 1 whole (400 mg). Pomegranate juice,  cup (215 mg). Pork, 3 oz (350 mg). Potato chips, salted, 1 oz (465 mg). Potato, baked with skin, 1 medium (925 mg). Potatoes, boiled,  cup (255 mg). Potatoes, mashed,  cup (330 mg). Prune juice,  cup (370 mg). Prunes, 5 each (305 mg). Pudding, chocolate,  cup (230 mg). Pumpkin, canned,  cup (250 mg). Raisins, seedless,  cup (270 mg). Seeds, sunflower or pumpkin, 1 oz (240 mg). Soy milk, 1 cup (300 mg). Spinach,  cup (420 mg). Spinach, canned,  cup (370 mg). Sweet potato, baked with skin, 1 medium (450 mg). Swiss chard,  cup (480 mg). Tomato or vegetable juice,  cup (275 mg). Tomato sauce or puree,  cup (400-550 mg). Tomato, raw, 1 medium (290 mg). Tomatoes, canned,  cup (200-300 mg). [place], 3 oz (250 mg). Wheat germ, 1 oz (250 mg). Winter squash,  cup (250 mg). Yogurt, plain or fruited, 6 oz (260-435 mg). Zucchini,  cup (220 mg). MODERATE IN POTASSIUM The following foods and beverages have 50-200 mg of potassium per serving: Apple, 1 each (150 mg). Apple juice,  cup (150 mg). Applesauce,  cup (90 mg). Apricot nectar,  cup (140 mg). Asparagus, small spears,  cup or 6 spears (155 mg). Bagel, cinnamon raisin, 1 each (130 mg). Bagel, egg or plain, 4 in., 1 each (70 mg). Beans, green,  cup (90 mg). Beans, yellow,  cup (190 mg). Beer, regular, 12 oz (100 mg). Beets, canned,  cup (125 mg). Blackberries,  cup (115 mg). Blueberries,  cup (60 mg). Bread, whole wheat, 1 slice (70 mg). Broccoli, raw,  cup (145 mg). Cabbage,  cup (150 mg). Carrots, cooked or raw,  cup  (180 mg). Cauliflower, raw,  cup (150 mg). Celery, raw,  cup (155 mg). Cereal, bran flakes, cup (120-150 mg). Cheese, cottage,  cup (110 mg). Cherries, 10 each (150 mg). Chocolate, 1 oz bar (165 mg). Coffee, brewed 6 oz (90 mg). Corn,  cup or 1 ear (195 mg). Cucumbers,  cup (80 mg). Egg, large, 1 each (60 mg). Eggplant,  cup (60 mg). Endive, raw, cup (80 mg). English muffin, 1 each (65 mg). Fish, orange roughy, 3 oz (150 mg). Frankfurter, beef or pork, 1 each (75 mg). Fruit cocktail,  cup (115 mg). Grape juice,  cup (170 mg). Grapefruit,  fruit (175 mg). Grapes,  cup (155 mg). Greens: kale, turnip, collard,  cup (110-150 mg). Ice cream or frozen yogurt, chocolate,  cup (175 mg). Ice cream or frozen yogurt, vanilla,  cup (120-150 mg). Lemons, limes, 1 each (80 mg). Lettuce, all types, 1 cup (100 mg). Mixed vegetables,  cup (150 mg). Mushrooms, raw,  cup (110 mg). Nuts: walnuts, pecans,  or macadamia, 1 oz (125 mg). Oatmeal,  cup (80 mg). Okra,  cup (110 mg). Onions, raw,  cup (120 mg). Peach, 1 each (185 mg). Peaches, canned,  cup (120 mg). Pears, canned,  cup (120 mg). Peas, green, frozen,  cup (90 mg). Peppers, green,  cup (130 mg). Peppers, red,  cup (160 mg). Pineapple juice,  cup (165 mg). Pineapple, fresh or canned,  cup (100 mg). Plums, 1 each (105 mg). Pudding, vanilla,  cup (150 mg). Raspberries,  cup (90 mg). Rhubarb,  cup (115 mg). Rice, wild,  cup (80 mg). Shrimp, 3 oz (155 mg). Spinach, raw, 1 cup (170 mg). Strawberries,  cup (125 mg). Summer squash  cup (175-200 mg). Swiss chard, raw, 1 cup (135 mg). Tangerines, 1 each (140 mg). Tea, brewed, 6 oz (65 mg). Turnips,  cup (140 mg). Watermelon,  cup (85 mg). Wine, red, table, 5 oz (180 mg). Wine, white, table, 5 oz (100 mg). LOW IN POTASSIUM The following foods and beverages have less than 50 mg of potassium per serving. Bread, white, 1 slice (30 mg). Carbonated  beverages, 12 oz (less than 5 mg). Cheese, 1 oz (20-30 mg). Cranberries,  cup (45 mg). Cranberry juice cocktail,  cup (20 mg). Fats and oils, 1 Tbsp (less than 5 mg). Hummus, 1 Tbsp (32 mg). Nectar: papaya, mango, or pear,  cup (35 mg). Rice, white or brown,  cup (50 mg). Spaghetti or macaroni,  cup cooked (30 mg). Tortilla, flour or corn, 1 each (50 mg). Waffle, 4 in., 1 each (50 mg). Water chestnuts,  cup (40 mg).   This information is not intended to replace advice given to you by your health care provider. Make sure you discuss any questions you have with your health care provider.   Document Released: 05/18/2005 Document Revised: 10/09/2013 Document Reviewed: 08/31/2013 Elsevier Interactive Patient Education 2016 Elsevier Inc.  Probiotics WHAT ARE PROBIOTICS? Probiotics are the good bacteria and yeasts that live in your body and keep you and your digestive system healthy. Probiotics also help your body's defense (immune) system and protect your body against bad bacterial growth.  Certain foods contain probiotics, such as yogurt. Probiotics can also be purchased as a supplement. As with any supplement or drug, it is important to discuss its use with your health care provider.  WHAT AFFECTS THE BALANCE OF BACTERIA IN MY BODY? The balance of bacteria in your body can be affected by:  Antibiotic medicines. Antibiotics are sometimes necessary to treat infection. Unfortunately, they may kill good or friendly bacteria in your body as well as the bad bacteria. This may lead to stomach problems like diarrhea, gas, and cramping. Disease. Some conditions are the result of an overgrowth of bad bacteria, yeasts, parasites, or fungi. These conditions include:   Infectious diarrhea. Stomach and respiratory infections. Skin infections. Irritable bowel syndrome (IBS). Inflammatory bowel diseases. Ulcer due to Helicobacter pylori (H. pylori) infection. Tooth decay and periodontal  disease. Vaginal infections. Stress and poor diet may also lower the good bacteria in your body.  WHAT TYPE OF PROBIOTIC IS RIGHT FOR ME? Probiotics are available over the counter at your local pharmacy, health food, or grocery store. They come in many different forms, combinations of strains, and dosing strengths. Some may need to be refrigerated. Always read the label for storage and usage instructions. Specific strains have been shown to be more effective for certain conditions. Ask your health care provider what option is best for you.  WHY WOULD I NEED PROBIOTICS?  There are many reasons your health care provider might recommend a probiotic supplement, including:  Diarrhea. Constipation. IBS. Respiratory infections. Yeast infections. Acne, eczema, and other skin conditions. Frequent urinary tract infections (UTIs). ARE THERE SIDE EFFECTS OF PROBIOTICS? Some people experience mild side effects when taking probiotics. Side effects are usually temporary and may include:  Gas. Bloating. Cramping. Rarely, serious side effects, such as infection or immune system changes, may occur. WHAT ELSE DO I NEED TO KNOW ABOUT PROBIOTICS?  There are many different strains of probiotics. Certain strains may be more effective depending on your condition. Probiotics are available in varying doses. Ask your health care provider which probiotic you should use and how often.   If you are taking probiotics along with antibiotics, it is generally recommended to wait at least 2 hours between taking the antibiotic and taking the probiotic.   FOR MORE INFORMATION:  [email or web address]   This information is not intended to replace advice given to you by your health care provider. Make sure you discuss any questions you have with your health care provider.   Document Released: 05/01/2014 Document Reviewed: 05/01/2014 Elsevier Interactive Patient  Education 2016 [employer].   Hypokalemia Hypokalemia means that the amount of potassium in the blood is lower than normal. Potassium is a chemical, called an electrolyte, that helps regulate the amount of fluid in the body. It also stimulates muscle contraction and helps nerves function properly. Most of the body's potassium is inside of cells, and only a very small amount is in the blood. Because the amount in the blood is so small, minor changes can be life-threatening. CAUSES Antibiotics. Diarrhea or vomiting. Using laxatives too much, which can cause diarrhea. Chronic kidney disease. Water pills (diuretics). Eating disorders (bulimia). Low magnesium level. Sweating a lot. SIGNS AND SYMPTOMS Weakness. Constipation. Fatigue. Muscle cramps. Mental confusion. Skipped heartbeats or irregular heartbeat (palpitations). Tingling or numbness. DIAGNOSIS  Your health care provider can diagnose hypokalemia with blood tests. In addition to checking your potassium level, your health care provider may also check other lab tests. TREATMENT Hypokalemia can be treated with potassium supplements taken by mouth or adjustments in your current medicines. If your potassium level is very low, you may need to get potassium through a vein (IV) and be monitored in the hospital. A diet high in potassium is also helpful. Foods high in potassium are: Nuts, such as peanuts and pistachios. Seeds, such as sunflower seeds and pumpkin seeds. Peas, lentils, and lima beans. Whole grain and bran cereals and breads. Fresh fruit and vegetables, such as apricots, avocado, bananas, cantaloupe, kiwi, oranges, tomatoes, asparagus, and potatoes. Orange and tomato juices. Red meats. Fruit yogurt. HOME CARE INSTRUCTIONS Take all medicines as prescribed by your health care provider. Maintain a healthy diet by including nutritious food, such as fruits, vegetables, nuts, whole grains, and lean meats. If you are taking a  laxative, be sure to follow the directions on the label. SEEK MEDICAL CARE IF: Your weakness gets worse. You feel your heart pounding or racing. You are vomiting or having diarrhea. You are diabetic and having trouble keeping your blood glucose in the normal range. SEEK IMMEDIATE MEDICAL CARE IF: You have chest pain, shortness of breath, or dizziness. You are vomiting or having diarrhea for more than 2 days. You faint. MAKE SURE YOU:  Understand these instructions. Will watch your condition. Will get help right away if you are not doing well or get worse.  This information is not intended to replace advice given to you by your health care provider. Make sure you discuss any questions you have with your health care provider.   Document Released: 10/04/2005 Document Revised: 10/25/2014 Document Reviewed: 04/06/2013 Elsevier Interactive Patient Education [employer].

## 2017-09-06 DIAGNOSIS — K219 Gastro-esophageal reflux disease without esophagitis: Secondary | ICD-10-CM | POA: Insufficient documentation

## 2017-09-22 ENCOUNTER — Encounter: Payer: Self-pay | Admitting: Physician Assistant

## 2017-09-22 DIAGNOSIS — R142 Eructation: Secondary | ICD-10-CM | POA: Insufficient documentation

## 2017-09-22 DIAGNOSIS — R141 Gas pain: Secondary | ICD-10-CM | POA: Insufficient documentation

## 2017-11-08 DIAGNOSIS — M6289 Other specified disorders of muscle: Secondary | ICD-10-CM | POA: Insufficient documentation

## 2018-02-27 DIAGNOSIS — M25522 Pain in left elbow: Secondary | ICD-10-CM | POA: Insufficient documentation

## 2018-02-27 DIAGNOSIS — M7702 Medial epicondylitis, left elbow: Secondary | ICD-10-CM | POA: Insufficient documentation

## 2018-03-30 DIAGNOSIS — E1159 Type 2 diabetes mellitus with other circulatory complications: Secondary | ICD-10-CM | POA: Insufficient documentation

## 2018-03-30 DIAGNOSIS — I152 Hypertension secondary to endocrine disorders: Secondary | ICD-10-CM

## 2018-03-30 HISTORY — DX: Type 2 diabetes mellitus with other circulatory complications: E11.59

## 2018-03-30 HISTORY — DX: Hypertension secondary to endocrine disorders: I15.2

## 2018-05-04 ENCOUNTER — Telehealth: Payer: Self-pay | Admitting: Gastroenterology

## 2018-05-15 NOTE — Telephone Encounter (Signed)
Dr. Nandigam reviewed records and has accepted patient. Ok to schedule Office Visit. Left message for patient to return my call °

## 2018-05-25 DIAGNOSIS — R400 Somnolence: Secondary | ICD-10-CM | POA: Insufficient documentation

## 2018-06-08 ENCOUNTER — Encounter: Payer: Self-pay | Admitting: Gastroenterology

## 2018-06-29 DIAGNOSIS — G4733 Obstructive sleep apnea (adult) (pediatric): Secondary | ICD-10-CM | POA: Insufficient documentation

## 2018-07-27 ENCOUNTER — Encounter: Payer: Self-pay | Admitting: Gastroenterology

## 2018-07-27 ENCOUNTER — Ambulatory Visit (INDEPENDENT_AMBULATORY_CARE_PROVIDER_SITE_OTHER): Payer: Managed Care, Other (non HMO) | Admitting: Gastroenterology

## 2018-07-27 VITALS — BP 126/66 | HR 108 | Ht 66.5 in | Wt 207.2 lb

## 2018-07-27 DIAGNOSIS — R198 Other specified symptoms and signs involving the digestive system and abdomen: Secondary | ICD-10-CM | POA: Diagnosis not present

## 2018-07-27 DIAGNOSIS — K5909 Other constipation: Secondary | ICD-10-CM | POA: Diagnosis not present

## 2018-07-27 DIAGNOSIS — M6289 Other specified disorders of muscle: Secondary | ICD-10-CM | POA: Diagnosis not present

## 2018-07-27 DIAGNOSIS — R159 Full incontinence of feces: Secondary | ICD-10-CM | POA: Diagnosis not present

## 2018-07-27 NOTE — Patient Instructions (Addendum)
We are giving you a Plenvu kit for a bowel purge today   We are referring you to [facility] for physical therapy , they will contact with that  appointment  Take Benefiber 1 tablespoon three times a day with meals   Follow up in 3 months  If you are age 53 or older, your body mass index should be between 23-30. Your Body mass index is 32.95 kg/m. If this is out of the aforementioned range listed, please consider follow up with your Primary Care Provider.  If you are age 98 or younger, your body mass index should be between 19-25. Your Body mass index is 32.95 kg/m. If this is out of the aformentioned range listed, please consider follow up with your Primary Care Provider.    Thank you for choosing [facility]  Karleen Hampshire Nandigam,MD

## 2018-07-27 NOTE — Progress Notes (Signed)
Helen Perez    [id number]    1966-10-03  Primary Care Physician:[facility]habilitation, [facility] &  Referring Physician: [facility], [facility] & No address on file  Chief complaint: Fecal incontinence  HPI: 52 year old female here for new patient evaluation with complaints of fecal incontinence She was having significant diarrhea for past 1 year but has improved and is currently constipated.  She is passing small pellets and she has to digitally disimpact from the rectum.  She passes pellets of stool in her sleep and does not have any sensation in the rectum. She has undergone EGD and colonoscopy.  Was recommended trial of lactose-free diet, IBgard and dicyclomine with no improvement  ColonoscopyMay 4, 2018: Normal  EGD 12/24/16 Mild chronic gastritis.  Negative for H. Pylori Distal esophageal biopsies showed inflamed gastric type mucosa with no Barrett's esophagus  CT abdomen and pelvis August 2018 showed fatty liver otherwise unremarkable. She  also had abdominal ultrasound with elastography did not show increased fibrosis  Outpatient Encounter Medications as of 07/27/2018  Medication Sig  . acetaminophen (TYLENOL) 650 MG CR tablet Take 1 tablet by mouth as needed.  Marland Kitchen aspirin EC 81 MG tablet Take 1 tablet by mouth daily.  Marland Kitchen atorvastatin (LIPITOR) 40 MG tablet Take 1 tablet by mouth daily.  Marland Kitchen escitalopram (LEXAPRO) 20 MG tablet Take 20 mg by mouth daily.  . Furosemide (LASIX PO) Take by mouth.  . gabapentin (NEURONTIN) 300 MG capsule Take 300 mg by mouth 3 (three) times daily.  . Insulin Aspart (NOVOLOG [place]) Inject into the skin.  . Insulin Glargine (TOUJEO SOLOSTAR [place]) Inject into the skin.  Marland Kitchen lisinopril (PRINIVIL,ZESTRIL) 5 MG tablet Take 5 mg by mouth daily.  Marland Kitchen omeprazole (PRILOSEC) 20 MG capsule Take 1 capsule by mouth daily.  . Pregabalin (LYRICA PO) Take by mouth.  . Semaglutide,0.25 or  0.5MG /DOS, (OZEMPIC, 0.25 OR 0.5 MG/DOSE,) 2 MG/1.5ML SOPN Inject 1 Dose into the skin once a week.  Marland Kitchen VALACYCLOVIR HCL PO Take by mouth.  . [DISCONTINUED] ATORVASTATIN CALCIUM PO Take by mouth.  . [DISCONTINUED] dicyclomine (BENTYL) 20 MG tablet Take 1 tablet (20 mg total) by mouth 2 (two) times daily.  . [DISCONTINUED] loperamide (IMODIUM) 2 MG capsule Take 1 capsule (2 mg total) by mouth 4 (four) times daily as needed for diarrhea or loose stools.  . [DISCONTINUED] OMEPRAZOLE PO Take by mouth.  . [DISCONTINUED] traZODone (DESYREL) 50 MG tablet Take 50 mg by mouth at bedtime.   No facility-administered encounter medications on file as of 07/27/2018.     Allergies as of 07/27/2018 - Review Complete 07/27/2018  Allergen Reaction Noted  . Latex  04/07/2017    Past Medical History:  Diagnosis Date  . Anxiety   . Depression   . Diabetes mellitus without complication ([place])   . Sleep apnea    CPAP  . Stroke [facility])     Past Surgical History:  Procedure Laterality Date  . ABDOMINAL HYSTERECTOMY    . FOOT SURGERY Right     Family History  Problem Relation Age of Onset  . Diabetes Mother   . Heart disease Mother   . Diabetes Sister   . Diabetes Brother   . Diabetes Maternal Grandmother   . Diabetes Brother   . Kidney failure Brother   . Kidney failure Sister     Social History   Socioeconomic History  . Marital status: Single  Spouse name: Not on file  . Number of children: 1  . Years of education: Not on file  . Highest education level: Not on file  Occupational History  . Not on file  Social Needs  . Financial resource strain: Not on file  . Food insecurity:    Worry: Not on file    Inability: Not on file  . Transportation needs:    Medical: Not on file    Non-medical: Not on file  Tobacco Use  . Smoking status: Former Smoker    Types: Cigarettes    Last attempt to quit: 07/27/2012    Years since quitting: 6.0  . Smokeless tobacco: Never Used  Substance  and Sexual Activity  . Alcohol use: Yes    Comment: occ  . Drug use: No  . Sexual activity: Not on file  Lifestyle  . Physical activity:    Days per week: Not on file    Minutes per session: Not on file  . Stress: Not on file  Relationships  . Social connections:    Talks on phone: Not on file    Gets together: Not on file    Attends religious service: Not on file    Active member of club or organization: Not on file    Attends meetings of clubs or organizations: Not on file    Relationship status: Not on file  . Intimate partner violence:    Fear of current or ex partner: Not on file    Emotionally abused: Not on file    Physically abused: Not on file    Forced sexual activity: Not on file  Other Topics Concern  . Not on file  Social History Narrative  . Not on file      Review of systems: Review of Systems  Constitutional: Negative for fever and chills.  HENT: Positive for sinus problem  eyes: Negative for blurred vision.  Respiratory: Negative for cough, shortness of breath and wheezing.   Cardiovascular: Negative for chest pain and palpitations.  Gastrointestinal: as per HPI Genitourinary: Negative for dysuria, urgency, frequency and hematuria.  Musculoskeletal: Positive for myalgias, back pain and joint pain.  Skin: Negative for itching and rash.  Neurological: Negative for dizziness, tremors, focal weakness, seizures and loss of consciousness.  Endo/Heme/Allergies: Positive for seasonal allergies.  Psychiatric/Behavioral: Negative for suicidal ideas and hallucinations.  Positive for depression All other systems reviewed and are negative.   Physical Exam: Vitals:   07/27/18 1401  BP: 126/66  Pulse: (!) 108   Body mass index is 32.95 kg/m. Gen:      No acute distress HEENT:  EOMI, sclera anicteric Neck:     No masses; no thyromegaly Lungs:    Clear to auscultation bilaterally; normal respiratory effort CV:         Regular rate and rhythm; no  murmurs Abd:      + bowel sounds; soft, non-tender; no palpable masses, no distension Ext:    No edema; adequate peripheral perfusion Skin:      Warm and dry; no rash Neuro: alert and oriented x 3 Psych: normal mood and affect  Data Reviewed:  Reviewed labs, radiology imaging, old records and pertinent past GI work up   Assessment and Plan/Recommendations:  52 year old female with complaints of alternating constipation and diarrhea and fecal incontinence  On rectal exam patient has very weak anal sphincter, rectal vault with hard stool.  She had a small pellet in the anal verge but patient denied having  any sensation or urge to defecate.  She currently has predominant constipation and fecal incontinence Will do bowel purge Start Benefiber 1 tablespoon 3 times daily with meals Increase fluid intake to 60 to 80 ounces per day Referral to pelvic floor physical therapy for biofeedback for outlet dysfunction and to improve anal sphincter tone If continues to have significant symptoms, will schedule anorectal manometry and consider referral to Dr. Marcello Moores for possible InterStim placement Greater than 50% of the time used for counseling as well as treatment plan and follow-up. She had multiple questions which were answered to her satisfaction  K. Denzil Magnuson , MD [phone]    CC: [facility], [facility]

## 2018-08-02 ENCOUNTER — Encounter: Payer: Self-pay | Admitting: Physical Therapy

## 2018-08-02 ENCOUNTER — Ambulatory Visit: Payer: Managed Care, Other (non HMO) | Attending: Gastroenterology | Admitting: Physical Therapy

## 2018-08-02 ENCOUNTER — Other Ambulatory Visit: Payer: Self-pay

## 2018-08-02 ENCOUNTER — Encounter: Payer: Self-pay | Admitting: Gastroenterology

## 2018-08-02 DIAGNOSIS — R279 Unspecified lack of coordination: Secondary | ICD-10-CM

## 2018-08-02 DIAGNOSIS — M6281 Muscle weakness (generalized): Secondary | ICD-10-CM | POA: Diagnosis not present

## 2018-08-02 NOTE — Patient Instructions (Signed)
About Abdominal Massage  Abdominal massage, also called external colon massage, is a self-treatment circular massage technique that can reduce and eliminate gas and ease constipation. The colon naturally contracts in waves in a clockwise direction starting from inside the right hip, moving up toward the ribs, across the belly, and down inside the left hip.  When you perform circular abdominal massage, you help stimulate your colon's normal wave pattern of movement called peristalsis.  It is most beneficial when done after eating.  Positioning You can practice abdominal massage with oil while lying down, or in the shower with soap.  Some people find that it is just as effective to do the massage through clothing while sitting or standing.  How to Massage Start by placing your finger tips or knuckles on your right side, just inside your hip bone.  . Make small circular movements while you move upward toward your rib cage.   . Once you reach the bottom right side of your rib cage, take your circular movements across to the left side of the bottom of your rib cage.  . Next, move downward until you reach the inside of your left hip bone.  This is the path your feces travel in your colon. . Continue to perform your abdominal massage in this pattern for 10 minutes each day.     You can apply as much pressure as is comfortable in your massage.  Start gently and build pressure as you continue to practice.  Notice any areas of pain as you massage; areas of slight pain may be relieved as you massage, but if you have areas of significant or intense pain, consult with your healthcare provider.  Other Considerations . General physical activity including bending and stretching can have a beneficial massage-like effect on the colon.  Deep breathing can also stimulate the colon because breathing deeply activates the same nervous system that supplies the colon.   . Abdominal massage should always be used in  combination with a bowel-conscious diet that is high in the proper type of fiber for you, fluids (primarily water), and a regular exercise program.   Moisturizers . They are used in the vagina to hydrate the mucous membrane that make up the vaginal canal. . Designed to keep a more normal acid balance (ph) . Once placed in the vagina, it will last between two to three days.  . Use 2-3 times per week at bedtime and last longer than 60 min. . Ingredients to avoid is glycerin and fragrance, can increase chance of infection . Should not be used just before sex due to causing irritation . Most are gels administered either in a tampon-shaped applicator or as a vaginal suppository. They are non-hormonal.   Types of Moisturizers . Samul Dada- drug store . Vitamin E vaginal suppositories- Whole foods, Amazon . Moist Again . Coconut oil- can break down condoms . Julva- (Do no use if on Tamoxifen) amazon . Yes moisturizer- amazon . NeuEve Silk , NeuEve Silver for menopausal or over 65 (if have severe vaginal atrophy or cancer treatments use NeuEve Silk for  1 month than move to [employer])- [employer], MapleFlower.dk . Olive and Bee intimate cream- www.oliveandbee.com.au . Mae vaginal moisturizer- Amazon  Creams to use externally on the Vulva area  Albertson's (good for for cancer patients that had radiation to the area)- [place] or [employer].FlyingBasics.com.br  V-magic cream - amazon  Julva-amazon  Vital "V [place] salve ( help moisturize and help with thinning vulvar  area, does have [place] by Damiva labial moisturizer ([place],    Things to avoid in the vaginal area . Do not use things to irritate the vulvar area . No lotions just specialized creams for the vulva area- Neogyn, V-magic,  . No soaps; can use Aveeno or Calendula cleanser if needed. Must be gentle . No deodorants . No douches . Good to sleep  without underwear to let the vaginal area to air out . No scrubbing: spread the lips to let warm water rinse over labias and pat dry   Toileting Techniques for Bowel Movements (Defecation) Using your belly (abdomen) and pelvic floor muscles to have a bowel movement is usually instinctive.  Sometimes people can have problems with these muscles and have to relearn proper defecation (emptying) techniques.  If you have weakness in your muscles, organs that are falling out, decreased sensation in your pelvis, or ignore your urge to go, you may find yourself straining to have a bowel movement.  You are straining if you are: . holding your breath or taking in a huge gulp of air and holding it  . keeping your lips and jaw tensed and closed tightly . turning red in the face because of excessive pushing or forcing . developing or worsening your  hemorrhoids . getting faint while pushing . not emptying completely and have to defecate many times a day  If you are straining, you are actually making it harder for yourself to have a bowel movement.  Many people find they are pulling up with the pelvic floor muscles and closing off instead of opening the anus. Due to lack pelvic floor relaxation and coordination the abdominal muscles, one has to work harder to push the feces out.  Many people have never been taught how to defecate efficiently and effectively.  Notice what happens to your body when you are having a bowel movement.  While you are sitting on the toilet pay attention to the following areas: . Jaw and mouth position . Angle of your hips   . Whether your feet touch the ground or not . Arm placement  . Spine position . Waist . Belly tension . Anus (opening of the anal canal)  An Evacuation/Defecation Plan   Here are the 4 basic points:  1. Lean forward enough for your elbows to rest on your knees 2. Support your feet on the floor or use a low stool if your feet don't touch the floor  3. Push  out your belly as if you have swallowed a beach ball-you should feel a widening of your waist 4. Open and relax your pelvic floor muscles, rather than tightening around the anus      The following conditions my require modifications to your toileting posture:  . If you have had surgery in the past that limits your back, hip, pelvic, knee or ankle flexibility . Constipation   Your healthcare practitioner may make the following additional suggestions and adjustments:  1) Sit on the toilet  a) Make sure your feet are supported. b) Notice your hip angle and spine position-most people find it effective to lean forward or raise their knees, which can help the muscles around the anus to relax  c) When you lean forward, place your forearms on your thighs for support  2) Relax suggestions a) Breath deeply in through your nose and out slowly through your mouth as if you are  smelling the flowers and blowing out the candles. b) To become aware of how to relax your muscles, contracting and releasing muscles can be helpful.  Pull your pelvic floor muscles in tightly by using the image of holding back gas, or closing around the anus (visualize making a circle smaller) and lifting the anus up and in.  Then release the muscles and your anus should drop down and feel open. Repeat 5 times ending with the feeling of relaxation. c) Keep your pelvic floor muscles relaxed; let your belly bulge out. d) The digestive tract starts at the mouth and ends at the anal opening, so be sure to relax both ends of the tube.  Place your tongue on the roof of your mouth with your teeth separated.  This helps relax your mouth and will help to relax the anus at the same time.  3) Empty (defecation) a) Keep your pelvic floor and sphincter relaxed, then bulge your anal muscles.  Make the anal opening wide.  b) Stick your belly out as if you have swallowed a beach ball. c) Make your belly wall hard using your belly muscles while  continuing to breathe. Doing this makes it easier to open your anus. d) Breath out and give a grunt (or try using other sounds such as ahhhh, shhhhh, ohhhh or grrrrrrr).  4) Finish a) As you finish your bowel movement, pull the pelvic floor muscles up and in.  This will leave your anus in the proper place rather than remaining pushed out and down. If you leave your anus pushed out and down, it will start to feel as though that is normal and give you incorrect signals about needing to have a bowel movement.    Earlie Counts, PT [facility] [address] [place] [place], [place] [address]

## 2018-08-02 NOTE — Therapy (Signed)
[facility] [address] [place], [place], [address] Phone: [phone]   Fax:  [phone]  Physical Therapy Evaluation  Patient Details  Name: Helen Perez MRN: [id number] Date of Birth: 01-Jan-1966 Referring Provider (PT): Dr. Harl Bowie   Encounter Date: 08/02/2018  PT End of Session - 08/02/18 2209    Visit Number  1    Date for PT Re-Evaluation  09/27/18    Authorization Type  [employer]    PT Start Time  1530    PT Stop Time  1610    PT Time Calculation (min)  40 min    Activity Tolerance  Patient tolerated treatment well    Behavior During Therapy  [facility] for tasks assessed/performed       Past Medical History:  Diagnosis Date  . Anxiety   . Depression   . Diabetes mellitus without complication ([place])   . Sleep apnea    CPAP  . Stroke [facility])     Past Surgical History:  Procedure Laterality Date  . ABDOMINAL HYSTERECTOMY    . FOOT SURGERY Right     There were no vitals filed for this visit.   Subjective Assessment - 08/02/18 1537    Subjective  July 2018 started with explosive diarrhea for 1 year. Afterwards stayed constipated. Last 3 months constipation. Patient has pellets 2-3 times per day and just fall out. Now the stool is muschy and falls out.  Patient reports no urinary leakage. has to strain or rock to get bowels out or self diempaction    Patient Stated Goals  incresae strength the bathroom; has to strain or rock to get bowels out or self diempaction    Currently in Pain?  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 Harl Bowie    Onset Date/Surgical Date  04/17/17    Prior Therapy  None      Precautions   Precautions  None      Restrictions   Weight Bearing Restrictions  No      Balance Screen   Has the patient fallen in the past 6 months  No    Has the patient had a decrease in activity level because of a fear of falling?   No    Is the patient reluctant to leave their home because of a fear of falling?   Yes      [place] residence      Prior Function   Level of Independence  Independent    Vocation  Full time employment    Vocation Requirements  sitting    Leisure  no exercise      Cognition   Overall Cognitive Status  Within Functional Limits for tasks assessed      Posture/Postural Control   Posture/Postural Control  No significant limitations      ROM / Strength   AROM / PROM / Strength  AROM;PROM;Strength      AROM   Lumbar Flexion  full with decreased movement of lumbar spine    Lumbar Extension  decreased by 25%    Lumbar - Right Side Bend  decreased by 25%  Lumbar - Left Side Bend  decreased by 25%    Lumbar - Right Rotation  decreased by 25%    Lumbar - Left Rotation  decreased by 25%      Strength   Overall Strength Comments  abdominal strength 2/5; back extensor strength 3/5    Right Hip ABduction  4/5    Left Hip ABduction  4/5      Palpation   Palpation comment  tightness and tenderness in lower abdomen      Standardized Balance Assessment   Standardized Balance Assessment  Berg Balance Test      Berg Balance Test   Sit to Stand  Able to stand without using hands and stabilize independently    Standing Unsupported  Able to stand safely 2 minutes    Sitting with Back Unsupported but Feet Supported on Floor or Stool  Able to sit safely and securely 2 minutes    Stand to Sit  Sits safely with minimal use of hands    Transfers  Able to transfer safely, definite need of hands    Standing Unsupported with Eyes Closed  Able to stand 10 seconds safely    Standing Ubsupported with Feet Together  Able to place feet  together independently and stand 1 minute safely    From Standing, Reach Forward with Outstretched Arm  Can reach forward >12 cm safely (5")    From Standing Position, Pick up Object from Floor  Able to pick up shoe safely and easily    From Standing Position, Turn to Look Behind Over each Shoulder  Looks behind from both sides and weight shifts well    Turn 360 Degrees  Able to turn 360 degrees safely in 4 seconds or less    Standing Unsupported, Alternately Place Feet on Step/Stool  Able to stand independently and safely and complete 8 steps in 20 seconds    Standing Unsupported, One Foot in [employer] balance while stepping or standing    Standing on One Leg  Unable to try or needs assist to prevent fall    Total Score  46    Berg comment:  50% chance of falling                Objective measurements completed on examination: See above findings.    Pelvic Floor Special Questions - 08/02/18 0001    Currently Sexually Active  No    Urinary Leakage  No    Fecal incontinence  Yes   1-3 depends   Exam Type  Deferred   patient was not comfortable to pelvic floor assessment                PT Short Term Goals - 08/02/18 2219      PT SHORT TERM GOAL #1   Title  independent with initial HEP    Time  4    Period  Weeks    Status  New    Target Date  08/30/18      PT SHORT TERM GOAL #2   Title  understand correct toileting to easily expel stool    Time  4    Period  Weeks    Status  New    Target Date  08/30/18      PT SHORT TERM GOAL #3   Title  understand how to perform abdominal massage to improve peristalic motion of the intestines    Time  4    Period  Weeks  Status  New    Target Date  08/30/18      PT SHORT TERM GOAL #4   Title  understand ways to reduce vaginal dryness to reduce vulvar irritation    Time  4    Period  Weeks    Status  New    Target Date  08/30/18        PT Long Term Goals - 08/02/18 2222      PT LONG TERM GOAL #1    Title  independent with HEP and how to progress her HEp    Time  8    Period  Weeks    Status  New    Target Date  09/27/18      PT LONG TERM GOAL #2   Title  straining to have a bowel movement to reduce straining >/= 75%    Time  8    Period  Weeks    Status  New    Target Date  09/27/18      PT LONG TERM GOAL #3   Title  fecal leakage decreased >/= 75% so she is not having to wear depends     Time  8    Period  Weeks    Status  New    Target Date  09/27/18      PT LONG TERM GOAL #4   Title  pelvic floor strength >/= 3/5 to reduce stool leakage    Time  8    Period  Weeks    Status  New    Target Date  09/27/18      PT LONG TERM GOAL #5   Title  abilit to have a bowel movement without rocking back and forth to relax the pelvic floor    Time  8    Period  Weeks    Status  New    Target Date  09/27/18             Plan - 08/02/18 2210    Clinical Impression Statement  Patient is a 52 year old female with constipation at this time but in the past has had diarrhea.  Patient reports no pelvic pain.  Patient reports 2-3 bowel movements per day that are pellets.  At times patient has to manually remove the stool.  When patient tries to have a bowel movement she has to rock back and forth with straining.  Initially 1 year ago patient had issues with diarrhea and the past 6 months she has had issues with constipation. Patient deferred rectal assessment of the pelvic floor muscles at this time.  Lumbar ROM is limited by 25%.  Abdominal strength is 2/5 and back extensor strength 3/5.  Bilateral hip abduction strength is 4/5.  Patient has tightness and tenderness located in lower abdominals.  Patient reports vaginal dryness that is irritating to her vulvar area. Patient will benefit from skilled therapy to improve bowel movements without straining and improve quality of bowel movements.      History and Personal Factors relevant to plan of care:  Diabets; stroke; abdominal  hysterectomy    Clinical Presentation  Evolving    Clinical Presentation due to:  difficulty with going out in public due to stool leakage without her knowing    Clinical Decision Making  Moderate    Rehab Potential  Excellent    Clinical Impairments Affecting Rehab Potential  Diabets; stroke; abdominal hysterectomy    PT Frequency  1x / week  PT Duration  8 weeks    PT Treatment/Interventions  Biofeedback;Therapeutic exercise;Therapeutic activities;Neuromuscular re-education;Patient/family education;Manual techniques;Passive range of motion;Dry needling    PT Next Visit Plan  toileting technique and abdominal massage review; hip stretches; pelvic floor strength; lower abdominal release; diaphgramatic breathing    Consulted and Agree with Plan of Care  Patient       Patient will benefit from skilled therapeutic intervention in order to improve the following deficits and impairments:  Increased fascial restricitons, Decreased coordination, Increased muscle spasms, Impaired tone, Decreased activity tolerance, Decreased endurance, Decreased range of motion, Decreased strength  Visit Diagnosis: Muscle weakness (generalized) - Plan: PT plan of care cert/re-cert  Unspecified lack of coordination - Plan: PT plan of care cert/re-cert     Problem List There are no active problems to display for this patient.   Earlie Counts, PT 08/02/18 10:28 PM   [facility] [address] [place], [place], [address] Phone: [phone]   Fax:  [phone]  Name: Helen Perez MRN: [id number] Date of Birth: 1966-01-24

## 2018-08-10 ENCOUNTER — Ambulatory Visit: Payer: Managed Care, Other (non HMO) | Admitting: Physical Therapy

## 2018-08-10 ENCOUNTER — Encounter: Payer: Self-pay | Admitting: Physical Therapy

## 2018-08-10 DIAGNOSIS — R279 Unspecified lack of coordination: Secondary | ICD-10-CM

## 2018-08-10 DIAGNOSIS — M6281 Muscle weakness (generalized): Secondary | ICD-10-CM | POA: Diagnosis not present

## 2018-08-10 NOTE — Therapy (Signed)
[facility] [address], [place], [place], [address] Phone: [phone]   Fax:  [phone]  Physical Therapy Treatment  Patient Details  Name: Helen Perez MRN: [id number] Date of Birth: 1966-05-07 Referring Provider (PT): Dr. Harl Bowie   Encounter Date: 08/10/2018  PT End of Session - 08/10/18 1142    Visit Number  2    Authorization Type  [employer]    PT Start Time  1100    PT Stop Time  1140    PT Time Calculation (min)  40 min    Activity Tolerance  Patient tolerated treatment well    Behavior During Therapy  [facility] for tasks assessed/performed       Past Medical History:  Diagnosis Date  . Anxiety   . Depression   . Diabetes mellitus without complication ([place])   . Sleep apnea    CPAP  . Stroke [facility])     Past Surgical History:  Procedure Laterality Date  . ABDOMINAL HYSTERECTOMY    . FOOT SURGERY Right     There were no vitals filed for this visit.  Subjective Assessment - 08/10/18 1104    Subjective  I feel good today.     Patient Stated Goals  incresae strength the bathroom; has to strain or rock to get bowels out or self diempaction    Currently in Pain?  No/denies    Multiple Pain Sites  No                       [facility] Adult PT Treatment/Exercise - 08/10/18 0001      Therapeutic Activites    Therapeutic Activities  Other Therapeutic Activities    Other Therapeutic Activities  instructed patient on how to have a bowel movement correctly      Exercises   Exercises  Lumbar      Lumbar Exercises: Aerobic   Nustep  level 1; seat #9; arm #11      Lumbar Exercises: Seated   Other Seated Lumbar Exercises  diaphragmatic breathing in sitting      Manual Therapy   Manual Therapy  Soft Perez mobilization;Myofascial release    Soft Perez mobilization  soft Perez work to the diaphgram; circular massage to the abdomen to improve peristalic motion of the intestines for bowel  movement    Myofascial Release  along the abdomen by the diaphgram to release a tight area, lower left abdomen to release by the intestines and psos and right lower abdominal to release where the small and large intestines connect             PT Education - 08/10/18 1139    Education Details  abdominal massage; toileting technique    Person(s) Educated  Patient    Methods  Explanation;Demonstration;Verbal cues    Comprehension  Verbalized understanding       PT Short Term Goals - 08/10/18 1145      PT SHORT TERM GOAL #1   Period  Weeks    Status  On-going      PT SHORT TERM GOAL #2   Title  understand correct toileting to easily expel stool    Time  4    Period  Weeks    Status  On-going      PT SHORT TERM GOAL #3   Time  4    Period  Weeks    Status  On-going      PT SHORT TERM GOAL #  4   Title  understand ways to reduce vaginal dryness to reduce vulvar irritation    Time  4    Period  Weeks    Status  On-going        PT Long Term Goals - 08/02/18 2222      PT LONG TERM GOAL #1   Title  independent with HEP and how to progress her HEp    Time  8    Period  Weeks    Status  New    Target Date  09/27/18      PT LONG TERM GOAL #2   Title  straining to have a bowel movement to reduce straining >/= 75%    Time  8    Period  Weeks    Status  New    Target Date  09/27/18      PT LONG TERM GOAL #3   Title  fecal leakage decreased >/= 75% so she is not having to wear depends     Time  8    Period  Weeks    Status  New    Target Date  09/27/18      PT LONG TERM GOAL #4   Title  pelvic floor strength >/= 3/5 to reduce stool leakage    Time  8    Period  Weeks    Status  New    Target Date  09/27/18      PT LONG TERM GOAL #5   Title  abilit to have a bowel movement without rocking back and forth to relax the pelvic floor    Time  8    Period  Weeks    Status  New    Target Date  09/27/18            Plan - 08/10/18 1142    Clinical  Impression Statement  Patient has improve abdominal Perez mobility after manual work.  Patient was able to distinguish relaxation of the pelvic floor with toileting technique.  Patient had some good bowel sounds with abdominal Perez work.  Patient has not achieved goals due to just starting therapy.  Patient will benefit from skilled therapy to improve bowel movements without straining and improve quality of bowel movements.     Rehab Potential  Excellent    Clinical Impairments Affecting Rehab Potential  Diabets; stroke; abdominal hysterectomy    PT Frequency  1x / week    PT Duration  8 weeks    PT Treatment/Interventions  Biofeedback;Therapeutic exercise;Therapeutic activities;Neuromuscular re-education;Patient/family education;Manual techniques;Passive range of motion;Dry needling    PT Next Visit Plan  abdominal massage;  hip stretches; pelvic floor strength; lower abdominal release; diaphgramatic breathing    Recommended Other Services  MD signed initial eval    Consulted and Agree with Plan of Care  Patient       Patient will benefit from skilled therapeutic intervention in order to improve the following deficits and impairments:  Increased fascial restricitons, Decreased coordination, Increased muscle spasms, Impaired tone, Decreased activity tolerance, Decreased endurance, Decreased range of motion, Decreased strength  Visit Diagnosis: Muscle weakness (generalized)  Unspecified lack of coordination     Problem List There are no active problems to display for this patient.   Earlie Counts, PT 08/10/18 11:46 AM   [facility] [address] [place], [place], [address] Phone: [phone]   Fax:  [phone]  Name: Helen Perez MRN: [id number] Date of Birth: 09-Jan-1966

## 2018-08-10 NOTE — Patient Instructions (Signed)
Toileting Techniques for Bowel Movements (Defecation) Using your belly (abdomen) and pelvic floor muscles to have a bowel movement is usually instinctive.  Sometimes people can have problems with these muscles and have to relearn proper defecation (emptying) techniques.  If you have weakness in your muscles, organs that are falling out, decreased sensation in your pelvis, or ignore your urge to go, you may find yourself straining to have a bowel movement.  You are straining if you are: . holding your breath or taking in a huge gulp of air and holding it  . keeping your lips and jaw tensed and closed tightly . turning red in the face because of excessive pushing or forcing . developing or worsening your  hemorrhoids . getting faint while pushing . not emptying completely and have to defecate many times a day  If you are straining, you are actually making it harder for yourself to have a bowel movement.  Many people find they are pulling up with the pelvic floor muscles and closing off instead of opening the anus. Due to lack pelvic floor relaxation and coordination the abdominal muscles, one has to work harder to push the feces out.  Many people have never been taught how to defecate efficiently and effectively.  Notice what happens to your body when you are having a bowel movement.  While you are sitting on the toilet pay attention to the following areas: . Jaw and mouth position . Angle of your hips   . Whether your feet touch the ground or not . Arm placement  . Spine position . Waist . Belly tension . Anus (opening of the anal canal)  An Evacuation/Defecation Plan   Here are the 4 basic points:  1. Lean forward enough for your elbows to rest on your knees 2. Support your feet on the floor or use a low stool if your feet don't touch the floor  3. Push out your belly as if you have swallowed a beach ball-you should feel a widening of your waist 4. Open and relax your pelvic floor muscles,  rather than tightening around the anus      The following conditions my require modifications to your toileting posture:  . If you have had surgery in the past that limits your back, hip, pelvic, knee or ankle flexibility . Constipation   Your healthcare practitioner may make the following additional suggestions and adjustments:  1) Sit on the toilet  a) Make sure your feet are supported. b) Notice your hip angle and spine position-most people find it effective to lean forward or raise their knees, which can help the muscles around the anus to relax  c) When you lean forward, place your forearms on your thighs for support  2) Relax suggestions a) Breath deeply in through your nose and out slowly through your mouth as if you are smelling the flowers and blowing out the candles. b) To become aware of how to relax your muscles, contracting and releasing muscles can be helpful.  Pull your pelvic floor muscles in tightly by using the image of holding back gas, or closing around the anus (visualize making a circle smaller) and lifting the anus up and in.  Then release the muscles and your anus should drop down and feel open. Repeat 5 times ending with the feeling of relaxation. c) Keep your pelvic floor muscles relaxed; let your belly bulge out. d) The digestive tract starts at the mouth and ends at the anal opening, so be   sure to relax both ends of the tube.  Place your tongue on the roof of your mouth with your teeth separated.  This helps relax your mouth and will help to relax the anus at the same time.  3) Empty (defecation) a) Keep your pelvic floor and sphincter relaxed, then bulge your anal muscles.  Make the anal opening wide.  b) Stick your belly out as if you have swallowed a beach ball. c) Make your belly wall hard using your belly muscles while continuing to breathe. Doing this makes it easier to open your anus. d) Breath out and give a grunt (or try using other sounds such as  ahhhh, shhhhh, ohhhh or grrrrrrr).  4) Finish a) As you finish your bowel movement, pull the pelvic floor muscles up and in.  This will leave your anus in the proper place rather than remaining pushed out and down. If you leave your anus pushed out and down, it will start to feel as though that is normal and give you incorrect signals about needing to have a bowel movement.    Helen Perez, PT [facility] [address] [place], [place] [address] About Abdominal Massage  Abdominal massage, also called external colon massage, is a self-treatment circular massage technique that can reduce and eliminate gas and ease constipation. The colon naturally contracts in waves in a clockwise direction starting from inside the right hip, moving up toward the ribs, across the belly, and down inside the left hip.  When you perform circular abdominal massage, you help stimulate your colon's normal wave pattern of movement called peristalsis.  It is most beneficial when done after eating.  Positioning You can practice abdominal massage with oil while lying down, or in the shower with soap.  Some people find that it is just as effective to do the massage through clothing while sitting or standing.  How to Massage Start by placing your finger tips or knuckles on your right side, just inside your hip bone.  . Make small circular movements while you move upward toward your rib cage.   . Once you reach the bottom right side of your rib cage, take your circular movements across to the left side of the bottom of your rib cage.  . Next, move downward until you reach the inside of your left hip bone.  This is the path your feces travel in your colon. . Continue to perform your abdominal massage in this pattern for 10 minutes each day.     You can apply as much pressure as is comfortable in your massage.  Start gently and build pressure as you continue to practice.  Notice any areas of  pain as you massage; areas of slight pain may be relieved as you massage, but if you have areas of significant or intense pain, consult with your healthcare provider.  Other Considerations . General physical activity including bending and stretching can have a beneficial massage-like effect on the colon.  Deep breathing can also stimulate the colon because breathing deeply activates the same nervous system that supplies the colon.   . Abdominal massage should always be used in combination with a bowel-conscious diet that is high in the proper type of fiber for you, fluids (primarily water), and a regular exercise program.  

## 2018-08-16 ENCOUNTER — Ambulatory Visit: Payer: Managed Care, Other (non HMO) | Admitting: Physical Therapy

## 2018-08-16 ENCOUNTER — Encounter: Payer: Self-pay | Admitting: Physical Therapy

## 2018-08-16 DIAGNOSIS — M6281 Muscle weakness (generalized): Secondary | ICD-10-CM

## 2018-08-16 DIAGNOSIS — R279 Unspecified lack of coordination: Secondary | ICD-10-CM

## 2018-08-16 NOTE — Patient Instructions (Signed)
Access Code: [email or web address]  URL: [email or web address]  Date: 08/16/2018  Prepared by: Cheryl Gray   Exercises   Seated Hamstring Stretch - 2 reps - 1 sets - 30 sec hold - 1x daily - 7x weekly   Seated Piriformis Stretch with Trunk Bend - 2 reps - 1 sets - 30 sec hold - 1x daily - 7x weekly   Seated Hip Flexor Stretch - 2 reps - 1 sets - 30 sec hold - 1x daily - 7x weekly   Cat-Camel - 10 reps - 1 sets - 1x daily - 7x weekly   Child's Pose Stretch - 2 reps - 1 sets - 30 sec hold - 1x daily - 7x weekly   Child's Pose with Sidebending - 2 reps - 1 sets - 30 sec hold - 1x daily - 7x weekly   Prone Press Up - 5 reps - 1 sets - 1 sec hold - 1x daily - 7x weekly   Supine Butterfly Groin Stretch - 1 reps - 1 sets - 1 min hold - 1x daily - 7x weekly   Supine Lower Trunk Rotation - 2 reps - 1 sets - 15 sec hold - 1x daily - 7x weekly   Supine Pelvic Floor Contract and Release - 10 reps - 1 sets - 1 sec hold - 2x daily - 7x weekly   Supine Pelvic Floor Contraction - 10 reps - 1 sets - 5 sec hold - 1x daily - 7x weekly   Supine Bridge with Pelvic Floor Contraction - 15 reps - 1 sets - 1x daily - 7x weekly  [facility] [address], [address] [place], [place] [address] Phone # [phone] Fax [phone] 

## 2018-08-16 NOTE — Therapy (Signed)
[facility] [address] [place], [place], [address] Phone: [phone]   Fax:  [phone]  Physical Therapy Treatment  Patient Details  Name: Helen Perez MRN: [id number] Date of Birth: 1966/07/01 Referring Provider (PT): Dr. Harl Bowie   Encounter Date: 08/16/2018  PT End of Session - 08/16/18 1606    Visit Number  3    Date for PT Re-Evaluation  09/27/18    Authorization Type  [employer]    PT Start Time  1530    PT Stop Time  1608    PT Time Calculation (min)  38 min    Activity Tolerance  Patient tolerated treatment well;No increased pain    Behavior During Therapy  WFL for tasks assessed/performed       Past Medical History:  Diagnosis Date  . Anxiety   . Depression   . Diabetes mellitus without complication ([place])   . Sleep apnea    CPAP  . Stroke [facility])     Past Surgical History:  Procedure Laterality Date  . ABDOMINAL HYSTERECTOMY    . FOOT SURGERY Right     There were no vitals filed for this visit.  Subjective Assessment - 08/16/18 1532    Subjective  The consitpation is good. Today I feel backed up. After the massage the hard parts are gone and easier to go to the bathroom.     Patient Stated Goals  incresae strength the bathroom; has to strain or rock to get bowels out or self diempaction    Currently in Pain?  No/denies    Multiple Pain Sites  No                       [facility] Adult PT Treatment/Exercise - 08/16/18 0001      Neuro Re-ed    Neuro Re-ed Details   hookly contract anus with tactile cues 5 sec 10 x; 10 quikd flicks      Exercises   Exercises  Lumbar      Lumbar Exercises: Stretches   Active Hamstring Stretch  Right;Left;1 rep;30 seconds   sitting   Lower Trunk Rotation  2 reps;30 seconds    Press Ups  10 reps    Quadruped Mid Back Stretch  3 reps;30 seconds   all 3 ways   Quad Stretch  Right;Left;2 reps;30 seconds   sitting   Piriformis Stretch   Right;Left;1 rep;30 seconds   sitting   Other Lumbar Stretch Exercise  supine butterfly stretch      Lumbar Exercises: Supine   Ab Set  10 reps;5 seconds   VC for breathing   Clam  20 reps;1 second   with abdominal bracing   Other Supine Lumbar Exercises  diaphragmatic breathing 5 times             PT Education - 08/16/18 1601    Education Details  Access Code: [email or web address]     Person(s) Educated  Patient    Methods  Explanation;Demonstration;Verbal cues;Handout    Comprehension  Returned demonstration;Verbalized understanding       PT Short Term Goals - 08/16/18 1547      PT SHORT TERM GOAL #1   Title  independent with initial HEP    Time  4    Period  Weeks    Status  On-going      PT SHORT TERM GOAL #2   Title  understand correct toileting to easily expel stool    Time  4    Period  Weeks    Status  Achieved      PT SHORT TERM GOAL #3   Title  understand how to perform abdominal massage to improve peristalic motion of the intestines    Time  4    Period  Weeks    Status  Achieved      PT SHORT TERM GOAL #4   Title  understand ways to reduce vaginal dryness to reduce vulvar irritation    Time  4    Period  Weeks    Status  Achieved        PT Long Term Goals - 08/02/18 2222      PT LONG TERM GOAL #1   Title  independent with HEP and how to progress her HEp    Time  8    Period  Weeks    Status  New    Target Date  09/27/18      PT LONG TERM GOAL #2   Title  straining to have a bowel movement to reduce straining >/= 75%    Time  8    Period  Weeks    Status  New    Target Date  09/27/18      PT LONG TERM GOAL #3   Title  fecal leakage decreased >/= 75% so she is not having to wear depends     Time  8    Period  Weeks    Status  New    Target Date  09/27/18      PT LONG TERM GOAL #4   Title  pelvic floor strength >/= 3/5 to reduce stool leakage    Time  8    Period  Weeks    Status  New    Target Date  09/27/18      PT LONG TERM GOAL #5    Title  abilit to have a bowel movement without rocking back and forth to relax the pelvic floor    Time  8    Period  Weeks    Status  New    Target Date  09/27/18            Plan - 08/16/18 1533    Clinical Impression Statement  Patient was able to go to the bathroom without straining after last session.  Patient has learned home stretchs and the beginning of pelvic floor exercises.  Patient is learninig low level abdominal exercises. Patient is able breath with pelvic floor contraction. Patient wil benefit from skilled therapy to improve bowel movements without straining and improve quality of bowel movements.     Rehab Potential  Excellent    Clinical Impairments Affecting Rehab Potential  Diabets; stroke; abdominal hysterectomy    PT Frequency  1x / week    PT Duration  8 weeks    PT Treatment/Interventions  Biofeedback;Therapeutic exercise;Therapeutic activities;Neuromuscular re-education;Patient/family education;Manual techniques;Passive range of motion;Dry needling    PT Next Visit Plan   pelvic floor strength hold 10-15 sec supine and 5 sec sit; lower abdominal release    PT Home Exercise Plan  Access Code: [email or web address]     Consulted and Agree with Plan of Care  Patient       Patient will benefit from skilled therapeutic intervention in order to improve the following deficits and impairments:  Increased fascial restricitons, Decreased coordination, Increased muscle spasms, Impaired tone, Decreased activity tolerance, Decreased endurance, Decreased range of motion, Decreased strength  Visit Diagnosis: Muscle  weakness (generalized)  Unspecified lack of coordination     Problem List There are no active problems to display for this patient.   Earlie Counts, PT 08/16/18 4:11 PM   [facility] [address] [place], [place], [address] Phone: [phone]   Fax:  [phone]  Name: Helen Perez MRN:  [id number] Date of Birth: 1966/02/08

## 2018-08-22 ENCOUNTER — Ambulatory Visit: Payer: Managed Care, Other (non HMO) | Admitting: Physical Therapy

## 2018-08-29 ENCOUNTER — Encounter: Payer: Self-pay | Admitting: Physical Therapy

## 2018-08-29 ENCOUNTER — Ambulatory Visit: Payer: Managed Care, Other (non HMO) | Attending: Gastroenterology | Admitting: Physical Therapy

## 2018-08-29 DIAGNOSIS — M6281 Muscle weakness (generalized): Secondary | ICD-10-CM | POA: Diagnosis not present

## 2018-08-29 DIAGNOSIS — R279 Unspecified lack of coordination: Secondary | ICD-10-CM | POA: Diagnosis present

## 2018-08-29 NOTE — Therapy (Signed)
[facility] [address], [place], [place], [address] Phone: [phone]   Fax:  [phone]  Physical Therapy Treatment  Patient Details  Name: Helen Perez MRN: [id number] Date of Birth: Aug 04, 1966 Referring Provider (PT): Dr. Harl Bowie   Encounter Date: 08/29/2018  PT End of Session - 08/29/18 1614    Visit Number  4    Date for PT Re-Evaluation  09/27/18    Authorization Type  [employer]    PT Start Time  9211    PT Stop Time  1655    PT Time Calculation (min)  40 min    Activity Tolerance  Patient tolerated treatment well;No increased pain    Behavior During Therapy  WFL for tasks assessed/performed       Past Medical History:  Diagnosis Date  . Anxiety   . Depression   . Diabetes mellitus without complication ([place])   . Sleep apnea    CPAP  . Stroke [facility])     Past Surgical History:  Procedure Laterality Date  . ABDOMINAL HYSTERECTOMY    . FOOT SURGERY Right     There were no vitals filed for this visit.  Subjective Assessment - 08/29/18 1615    Subjective  Things are going good. I am having better bowel movements. They are forming better. I went to the store and tried to sit on a bench but missed it and fell.     Patient Stated Goals  incresae strength the bathroom; has to strain or rock to get bowels out or self diempaction    Currently in Pain?  Yes    Pain Score  5     Pain Location  Coccyx    Pain Orientation  Mid    Pain Descriptors / Indicators  Aching    Pain Type  Acute pain    Pain Onset  More than a month ago    Pain Frequency  Intermittent    Aggravating Factors   sitting to standing    Pain Relieving Factors  heat    Multiple Pain Sites  No                       [facility] Adult PT Treatment/Exercise - 08/29/18 0001      Manual Therapy   Manual Therapy  Soft tissue mobilization;Myofascial release    Soft tissue mobilization  right coccygeus, right piriformis,  right levator ani, right SI joint; abdominal soft tissue work to release the fascia    Myofascial Release  midline abdominal release to improve tissue mobility and bowel movements             PT Education - 08/29/18 1655    Education Details  Access Code: [email or web address]     Person(s) Educated  Patient    Methods  Explanation;Demonstration;Verbal cues;Handout    Comprehension  Returned demonstration;Verbalized understanding       PT Short Term Goals - 08/29/18 1703      PT SHORT TERM GOAL #1   Title  independent with initial HEP    Time  4    Period  Weeks    Status  Achieved      PT SHORT TERM GOAL #2   Title  understand correct toileting to easily expel stool    Time  4    Period  Weeks    Status  Achieved      PT SHORT TERM GOAL #3   Title  understand  how to perform abdominal massage to improve peristalic motion of the intestines    Time  4    Period  Weeks    Status  Achieved        PT Long Term Goals - 08/02/18 2222      PT LONG TERM GOAL #1   Title  independent with HEP and how to progress her HEp    Time  8    Period  Weeks    Status  New    Target Date  09/27/18      PT LONG TERM GOAL #2   Title  straining to have a bowel movement to reduce straining >/= 75%    Time  8    Period  Weeks    Status  New    Target Date  09/27/18      PT LONG TERM GOAL #3   Title  fecal leakage decreased >/= 75% so she is not having to wear depends     Time  8    Period  Weeks    Status  New    Target Date  09/27/18      PT LONG TERM GOAL #4   Title  pelvic floor strength >/= 3/5 to reduce stool leakage    Time  8    Period  Weeks    Status  New    Target Date  09/27/18      PT LONG TERM GOAL #5   Title  abilit to have a bowel movement without rocking back and forth to relax the pelvic floor    Time  8    Period  Weeks    Status  New    Target Date  09/27/18            Plan - 08/29/18 1615    Clinical Impression Statement  Patient had coccyx pain from  missing a bench and fell on the floor. After therapy patient had no coccyx pain.  Patient is now working on [employer] for longer periods of time.  Patient is having more consistent bowel movements that are formed more. Patient will benefit from skilled therapy to improve bowel movements with straining and improve quality of bowel movements.     Rehab Potential  Excellent    Clinical Impairments Affecting Rehab Potential  Diabets; stroke; abdominal hysterectomy    PT Frequency  1x / week    PT Duration  8 weeks    PT Treatment/Interventions  Biofeedback;Therapeutic exercise;Therapeutic activities;Neuromuscular re-education;Patient/family education;Manual techniques;Passive range of motion;Dry needling    PT Next Visit Plan  if doing well then discharge, continue with longer pelvic floor contractions    PT Home Exercise Plan  Access Code: [email or web address]     Consulted and Agree with Plan of Care  Patient       Patient will benefit from skilled therapeutic intervention in order to improve the following deficits and impairments:  Increased fascial restricitons, Decreased coordination, Increased muscle spasms, Impaired tone, Decreased activity tolerance, Decreased endurance, Decreased range of motion, Decreased strength  Visit Diagnosis: Muscle weakness (generalized)  Unspecified lack of coordination     Problem List There are no active problems to display for this patient.   Earlie Counts, PT 08/29/18 5:04 PM   [facility] [address] [place], [place], [address] Phone: [phone]   Fax:  [phone]  Name: Chandler Stofer MRN: [id number] Date of Birth: 05-29-1966

## 2018-08-29 NOTE — Patient Instructions (Signed)
Access Code: [email or web address]  URL: [email or web address]  Date: 08/16/2018  Prepared by: Earlie Counts   Exercises   Seated Hamstring Stretch - 2 reps - 1 sets - 30 sec hold - 1x daily - 7x weekly   Seated Piriformis Stretch with Trunk Bend - 2 reps - 1 sets - 30 sec hold - 1x daily - 7x weekly   Seated Hip Flexor Stretch - 2 reps - 1 sets - 30 sec hold - 1x daily - 7x weekly   Cat-Camel - 10 reps - 1 sets - 1x daily - 7x weekly   Child's Pose Stretch - 2 reps - 1 sets - 30 sec hold - 1x daily - 7x weekly   Child's Pose with Sidebending - 2 reps - 1 sets - 30 sec hold - 1x daily - 7x weekly   Prone Press Up - 5 reps - 1 sets - 1 sec hold - 1x daily - 7x weekly   Supine Butterfly Groin Stretch - 1 reps - 1 sets - 1 min hold - 1x daily - 7x weekly   Supine Lower Trunk Rotation - 2 reps - 1 sets - 15 sec hold - 1x daily - 7x weekly   Supine Pelvic Floor Contract and Release - 10 reps - 1 sets - 1 sec hold - 2x daily - 7x weekly   Supine Pelvic Floor Contraction - 10 reps - 1 sets - 5 sec hold - 1x daily - 7x weekly   Supine Bridge with Pelvic Floor Contraction - 15 reps - 1 sets - 1x daily - 7x weekly  [facility] [address], [place], [place] [address] Phone # [phone] Fax [phone]

## 2018-09-05 ENCOUNTER — Ambulatory Visit: Payer: Managed Care, Other (non HMO) | Admitting: Physical Therapy

## 2018-09-05 ENCOUNTER — Encounter: Payer: Self-pay | Admitting: Physical Therapy

## 2018-09-05 DIAGNOSIS — M6281 Muscle weakness (generalized): Secondary | ICD-10-CM

## 2018-09-05 DIAGNOSIS — R279 Unspecified lack of coordination: Secondary | ICD-10-CM

## 2018-09-05 NOTE — Therapy (Signed)
[facility] [address], [place], [place], [address] Phone: [phone]   Fax:  [phone]  Physical Therapy Treatment  Patient Details  Name: Helen Perez MRN: [id number] Date of Birth: [birth year] Referring Provider (PT): Dr. Harl Bowie   Encounter Date: 09/05/2018  PT End of Session - 09/05/18 1622    Visit Number  5    Date for PT Re-Evaluation  09/27/18    Authorization Type  [employer]    PT Start Time  1157    PT Stop Time  1640   finished with therapy early   PT Time Calculation (min)  25 min    Activity Tolerance  Patient tolerated treatment well;No increased pain    Behavior During Therapy  WFL for tasks assessed/performed       Past Medical History:  Diagnosis Date  . Anxiety   . Depression   . Diabetes mellitus without complication ([place])   . Sleep apnea    CPAP  . Stroke [facility])     Past Surgical History:  Procedure Laterality Date  . ABDOMINAL HYSTERECTOMY    . FOOT SURGERY Right     There were no vitals filed for this visit.  Subjective Assessment - 09/05/18 1619    Subjective  No pain today.     Patient Stated Goals  incresae strength the bathroom; has to strain or rock to get bowels out or self diempaction    Currently in Pain?  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 Harl Bowie    Onset Date/Surgical Date  04/17/17    Prior  Therapy  None      Precautions   Precautions  None      [place] residence      Prior Function   Level of Independence  Independent    Vocation  Full time employment    Vocation Requirements  sitting    Leisure  no exercise      Cognition   Overall Cognitive Status  Within Functional Limits for tasks assessed      Posture/Postural Control   Posture/Postural Control  No significant limitations      AROM   Lumbar Extension  full     Lumbar - Right Side Bend  full    Lumbar - Left Side Bend  full    Lumbar - Right Rotation  full    Lumbar - Left Rotation  full      Strength   Overall Strength Comments  abdominal strength 3/5;     Right Hip ABduction  4/5    Left Hip ABduction  4/5      Palpation   Palpation comment  no lower abdominal  tenderness or tightness                Pelvic Floor Special Questions - 09/05/18 0001    Urinary Leakage  No    Fecal incontinence  No    Exam Type  Deferred   patient was not comfortable to pelvic floor assessment       [facility] Adult PT Treatment/Exercise - 09/05/18 0001      Exercises   Exercises  Lumbar      Lumbar Exercises: Aerobic   Stationary Bike  5 min level 2 while assessing the patient      Lumbar Exercises: Supine   Bridge  15 reps;1 second   with clam and red band     Lumbar Exercises: Sidelying   Hip Abduction  20 reps;1 second;Right;Left   with pelvic floor contraction            PT Education - 09/05/18 1646    Education Details  Access Code: [email or web address]     Person(s) Educated  Patient    Methods  Explanation;Demonstration;Verbal cues;Handout    Comprehension  Returned demonstration;Verbalized understanding       PT Short Term Goals - 09/05/18 1649      PT SHORT TERM GOAL #1   Title  independent with initial HEP    Time  4    Period  Weeks    Status  Achieved      PT SHORT TERM GOAL #2   Title  understand correct toileting to easily expel stool     Time  4    Period  Weeks    Status  Achieved      PT SHORT TERM GOAL #3   Title  understand how to perform abdominal massage to improve peristalic motion of the intestines    Time  4    Period  Weeks    Status  Achieved      PT SHORT TERM GOAL #4   Title  understand ways to reduce vaginal dryness to reduce vulvar irritation    Time  4    Period  Weeks    Status  Achieved        PT Long Term Goals - 09/05/18 1623      PT LONG TERM GOAL #1   Title  independent with HEP and how to progress her HEp    Time  8    Period  Weeks    Status  Achieved      PT LONG TERM GOAL #2   Title  straining to have a bowel movement to reduce straining >/= 75%    Time  8    Period  Weeks    Status  Achieved      PT LONG TERM GOAL #3   Title  fecal leakage decreased >/= 75% so she is not having to wear depends     Time  8    Period  Weeks    Status  Achieved      PT LONG TERM GOAL #4   Title  pelvic floor strength >/= 3/5 to reduce stool leakage    Time  8    Period  Weeks    Status  Achieved      PT LONG TERM GOAL #5   Title  abilit to have a bowel movement without rocking back and forth to relax the pelvic floor    Time  8    Period  Weeks    Status  Achieved            Plan - 09/05/18 1622    Clinical Impression Statement  Patient has met her goals. Patient is able to have a bowel movement without straining 75% of the time. Patient understands correct toileting technique. Patient is independent with her HEP and understands how to progress herself withholding the pelvic floor contraction longer. Patient reports no fecal incontinence. Patient still has some coccyx pain from when she fell on it. Patient is ready for discharge.     Rehab Potential  Excellent    Clinical Impairments Affecting Rehab Potential  Diabets; stroke; abdominal hysterectomy    PT Frequency  --    PT Duration  --    PT Treatment/Interventions  Biofeedback;Therapeutic exercise;Therapeutic  activities;Neuromuscular re-education;Patient/family education;Manual techniques;Passive range of motion;Dry needling    PT Next Visit Plan  discharge to HEP today    PT Home Exercise Plan  Access Code: [email or web address]     Consulted and Agree with Plan of Care  Patient       Patient will benefit from skilled therapeutic intervention in order to improve the following deficits and impairments:  Increased fascial restricitons, Decreased coordination, Increased muscle spasms, Impaired tone, Decreased activity tolerance, Decreased endurance, Decreased range of motion, Decreased strength  Visit Diagnosis: Muscle weakness (generalized)  Unspecified lack of coordination     Problem List There are no active problems to display for this patient.   Earlie Counts, PT 09/05/18 4:50 PM   [facility] [address], [place], [place], [address] Phone: [phone]   Fax:  [phone]  Name: Helen Perez MRN: [id number] Date of Birth: Mar 11, 1966  PHYSICAL THERAPY DISCHARGE SUMMARY  Visits from Start of Care: 5  Current functional level related to goals / functional outcomes: See above.    Remaining deficits: See above.    Education / Equipment: HEP Plan: Patient agrees to discharge.  Patient goals were met. Patient is being discharged due to meeting the stated rehab goals.  Thank you for the referrals. Earlie Counts, PT 09/05/18 4:51 PM   ?????

## 2018-09-05 NOTE — Patient Instructions (Signed)
Access Code: [email or web address]  URL: [email or web address]  Date: 09/05/2018  Prepared by: Earlie Counts   Exercises  Seated Hamstring Stretch - 2 reps - 1 sets - 30 sec hold - 1x daily - 7x weekly  Seated Piriformis Stretch with Trunk Bend - 2 reps - 1 sets - 30 sec hold - 1x daily - 7x weekly  Seated Hip Flexor Stretch - 2 reps - 1 sets - 30 sec hold - 1x daily - 7x weekly  Cat-Camel - 10 reps - 1 sets - 1x daily - 7x weekly  Child's Pose Stretch - 2 reps - 1 sets - 30 sec hold - 1x daily - 7x weekly  Child's Pose with Sidebending - 2 reps - 1 sets - 30 sec hold - 1x daily - 7x weekly  Prone Press Up - 5 reps - 1 sets - 1 sec hold - 1x daily - 7x weekly  Supine Butterfly Groin Stretch - 1 reps - 1 sets - 1 min hold - 1x daily - 7x weekly  Supine Lower Trunk Rotation - 2 reps - 1 sets - 15 sec hold - 1x daily - 7x weekly  Seated Pelvic Floor Contraction - 10 reps - 1 sets - 10 sec hold - 2x daily - 7x weekly  Hooklying Isometric Hip Flexion - 10 reps - 1 sets - 15 sec hold - 1x daily - 7x weekly  Sidelying Hip Abduction - 10 reps - 3 sets - 1x daily - 7x weekly  Bridge with Hip Abduction and Resistance - 10 reps - 1 sets - 1x daily - 7x weekly  Bird Dog - 10 reps - 1 sets - 1x daily - 7x weekly  [facility] [address], [place], [place] [address] Phone # [phone] Fax [phone]

## 2018-10-06 ENCOUNTER — Emergency Department (HOSPITAL_BASED_OUTPATIENT_CLINIC_OR_DEPARTMENT_OTHER)
Admission: EM | Admit: 2018-10-06 | Discharge: 2018-10-06 | Disposition: A | Discharge status: Home / Self Care | Payer: Managed Care, Other (non HMO) | Attending: Emergency Medicine | Admitting: Emergency Medicine

## 2018-10-06 ENCOUNTER — Encounter (HOSPITAL_BASED_OUTPATIENT_CLINIC_OR_DEPARTMENT_OTHER): Payer: Self-pay | Admitting: Radiology

## 2018-10-06 ENCOUNTER — Emergency Department (HOSPITAL_BASED_OUTPATIENT_CLINIC_OR_DEPARTMENT_OTHER): Payer: Managed Care, Other (non HMO)

## 2018-10-06 DIAGNOSIS — K5909 Other constipation: Secondary | ICD-10-CM

## 2018-10-06 DIAGNOSIS — K5900 Constipation, unspecified: Secondary | ICD-10-CM | POA: Diagnosis not present

## 2018-10-06 DIAGNOSIS — Z79899 Other long term (current) drug therapy: Secondary | ICD-10-CM | POA: Insufficient documentation

## 2018-10-06 DIAGNOSIS — Z7982 Long term (current) use of aspirin: Secondary | ICD-10-CM | POA: Diagnosis not present

## 2018-10-06 DIAGNOSIS — Z8673 Personal history of transient ischemic attack (TIA), and cerebral infarction without residual deficits: Secondary | ICD-10-CM | POA: Diagnosis not present

## 2018-10-06 DIAGNOSIS — Z91040 Latex allergy status: Secondary | ICD-10-CM | POA: Diagnosis not present

## 2018-10-06 DIAGNOSIS — E119 Type 2 diabetes mellitus without complications: Secondary | ICD-10-CM | POA: Diagnosis not present

## 2018-10-06 LAB — CBC WITH DIFFERENTIAL/PLATELET
Abs Immature Granulocytes: 0.02 10*3/uL (ref 0.00–0.07)
Basophils Absolute: 0 10*3/uL (ref 0.0–0.1)
Basophils Relative: 0 %
Eosinophils Absolute: 0.1 10*3/uL (ref 0.0–0.5)
Eosinophils Relative: 1 %
HCT: 36.5 % (ref 36.0–46.0)
Hemoglobin: 10.7 g/dL — ABNORMAL LOW (ref 12.0–15.0)
Immature Granulocytes: 0 %
Lymphocytes Relative: 38 %
Lymphs Abs: 3.2 10*3/uL (ref 0.7–4.0)
MCH: 25.3 pg — ABNORMAL LOW (ref 26.0–34.0)
MCHC: 29.3 g/dL — ABNORMAL LOW (ref 30.0–36.0)
MCV: 86.3 fL (ref 80.0–100.0)
Monocytes Absolute: 0.5 10*3/uL (ref 0.1–1.0)
Monocytes Relative: 6 %
Neutro Abs: 4.7 10*3/uL (ref 1.7–7.7)
Neutrophils Relative %: 55 %
Platelets: 312 10*3/uL (ref 150–400)
RBC: 4.23 MIL/uL (ref 3.87–5.11)
RDW: 14.3 % (ref 11.5–15.5)
WBC: 8.5 10*3/uL (ref 4.0–10.5)
nRBC: 0 % (ref 0.0–0.2)

## 2018-10-06 LAB — COMPREHENSIVE METABOLIC PANEL
ALT: 32 U/L (ref 0–44)
AST: 27 U/L (ref 15–41)
Albumin: 3.6 g/dL (ref 3.5–5.0)
Alkaline Phosphatase: 139 U/L — ABNORMAL HIGH (ref 38–126)
Anion gap: 7 (ref 5–15)
BUN: 30 mg/dL — ABNORMAL HIGH (ref 6–20)
CO2: 26 mmol/L (ref 22–32)
Calcium: 9.2 mg/dL (ref 8.9–10.3)
Chloride: 103 mmol/L (ref 98–111)
Creatinine, Ser: 1.17 mg/dL — ABNORMAL HIGH (ref 0.44–1.00)
GFR calc Af Amer: >60 mL/min (ref >60)
GFR calc non Af Amer: 53 mL/min — ABNORMAL LOW (ref >60)
Glucose, Bld: 165 mg/dL — ABNORMAL HIGH (ref 70–99)
Potassium: 4.4 mmol/L (ref 3.5–5.1)
Sodium: 136 mmol/L (ref 135–145)
Total Bilirubin: 0.8 mg/dL (ref 0.3–1.2)
Total Protein: 7.8 g/dL (ref 6.5–8.1)

## 2018-10-06 LAB — URINALYSIS, ROUTINE W REFLEX MICROSCOPIC
Bilirubin Urine: NEGATIVE
Glucose, UA: NEGATIVE mg/dL
Hgb urine dipstick: NEGATIVE
Ketones, ur: NEGATIVE mg/dL
Leukocytes, UA: NEGATIVE
Nitrite: NEGATIVE
Protein, ur: NEGATIVE mg/dL
Specific Gravity, Urine: <1.005 — ABNORMAL LOW (ref 1.005–1.030)
pH: 6 (ref 5.0–8.0)

## 2018-10-06 MED ORDER — BISACODYL 10 MG RE SUPP: 10.0000 mg | RECTAL | 0 refills | Status: DC | PRN

## 2018-10-06 MED ORDER — DOCUSATE SODIUM 100 MG PO CAPS: 100.0000 mg | ORAL_CAPSULE | Freq: Two times a day (BID) | ORAL | 0 refills | Status: DC

## 2018-10-06 MED ORDER — BISACODYL 10 MG RE SUPP
10.0000 mg | Freq: Once | RECTAL | Status: DC
  Filled 2018-10-06: qty 1

## 2018-10-06 MED ORDER — LACTULOSE 10 GM/15ML PO SOLN: 10.0000 g | Freq: Two times a day (BID) | ORAL | 0 refills | Status: DC

## 2018-10-06 MED ORDER — SODIUM CHLORIDE 0.9 % IV BOLUS
1000.0000 mL | Freq: Once | INTRAVENOUS | Status: AC
  Administered 2018-10-06: 1000 mL via INTRAVENOUS

## 2018-10-06 MED ORDER — IOPAMIDOL (ISOVUE-300) INJECTION 61%
100.0000 mL | Freq: Once | INTRAVENOUS | Status: AC | PRN
  Administered 2018-10-06: 100 mL via INTRAVENOUS

## 2018-10-06 NOTE — ED Provider Notes (Signed)
[facility] EMERGENCY DEPARTMENT Provider Note   CSN: [id number] Arrival date & time: 10/06/18  1047     History   Chief Complaint Chief Complaint  Patient presents with  . Constipation    HPI Helen Perez is a 52 y.o. female.  HPI Patient presents to the emergency department with constipation over the last 2 weeks.  The patient states that she is never had this happen previously.  She states that it is causing her some abdominal discomfort along with distention.  The patient states that she has had no other complaints.  She states that nothing seems to make the condition better or worse.  She states that she has been taking MiraLAX and increasing her fluid intake.  The patient denies chest pain, shortness of breath, headache,blurred vision, neck pain, fever, cough, weakness, numbness, dizziness, anorexia, edema,  nausea, vomiting, diarrhea, rash, back pain, dysuria, hematemesis, bloody stool, near syncope, or syncope. Past Medical History:  Diagnosis Date  . Anxiety   . Depression   . Diabetes mellitus without complication ([place])   . Sleep apnea    CPAP  . Stroke [facility])     There are no active problems to display for this patient.   Past Surgical History:  Procedure Laterality Date  . ABDOMINAL HYSTERECTOMY    . FOOT SURGERY Right      OB History   No obstetric history on file.      Home Medications    Prior to Admission medications   Medication Sig Start Date End Date Taking? Authorizing Provider  acetaminophen (TYLENOL) 650 MG CR tablet Take 1 tablet by mouth as needed.    [provider]  aspirin EC 81 MG tablet Take 1 tablet by mouth daily.    [provider]  atorvastatin (LIPITOR) 40 MG tablet Take 1 tablet by mouth daily. 06/21/18   [provider]  escitalopram (LEXAPRO) 20 MG tablet Take 20 mg by mouth daily.    [provider]  Furosemide (LASIX PO) Take by mouth.    [provider]  gabapentin  (NEURONTIN) 300 MG capsule Take 300 mg by mouth 3 (three) times daily.    [provider]  Insulin Aspart (NOVOLOG [place]) Inject into the skin.    [provider]  Insulin Glargine (TOUJEO SOLOSTAR [place]) Inject into the skin.    [provider]  lisinopril (PRINIVIL,ZESTRIL) 5 MG tablet Take 5 mg by mouth daily.    [provider]  omeprazole (PRILOSEC) 20 MG capsule Take 1 capsule by mouth daily. 06/21/18   [provider]  Pregabalin (LYRICA PO) Take by mouth.    [provider]  Semaglutide,0.25 or 0.5MG /DOS, (OZEMPIC, 0.25 OR 0.5 MG/DOSE,) 2 MG/1.5ML SOPN Inject 1 Dose into the skin once a week. 05/19/17   [provider]  VALACYCLOVIR HCL PO Take by mouth.    [provider]    Family History Family History  Problem Relation Age of Onset  . Diabetes Mother   . Heart disease Mother   . Diabetes Sister   . Diabetes Brother   . Diabetes Maternal Grandmother   . Diabetes Brother   . Kidney failure Brother   . Kidney failure Sister     Social History Social History   Tobacco Use  . Smoking status: Former Smoker    Types: Cigarettes    Last attempt to quit: 07/27/2012    Years since quitting: 6.1  . Smokeless tobacco: Never Used  Substance Use  Topics  . Alcohol use: Yes    Comment: occ  . Drug use: No     Allergies   Latex   Review of Systems Review of Systems All other systems negative except as documented in the HPI. All pertinent positives and negatives as reviewed in the HPI.  Physical Exam Updated Vital Signs BP (!) 143/87 (BP Location: Left Arm)   Pulse (!) 106   Temp 98.4 F (36.9 C) (Oral)   Resp 16   Ht 5\' 6"  (1.676 m)   Wt 97.5 kg   SpO2 100%   BMI 34.70 kg/m   Physical Exam Vitals signs and nursing note reviewed.  Constitutional:      General: She is not in acute distress.    Appearance: She is well-developed.  HENT:     Head: Normocephalic and atraumatic.  Eyes:     Pupils:  Pupils are equal, round, and reactive to light.  Neck:     Musculoskeletal: Normal range of motion and neck supple.  Cardiovascular:     Rate and Rhythm: Normal rate and regular rhythm.     Heart sounds: Normal heart sounds. No murmur. No friction rub. No gallop.   Pulmonary:     Effort: Pulmonary effort is normal. No respiratory distress.     Breath sounds: Normal breath sounds. No wheezing.  Abdominal:     General: Bowel sounds are normal. There is no distension.     Palpations: Abdomen is soft. There is no mass.     Tenderness: There is abdominal tenderness. There is no guarding or rebound.     Hernia: No hernia is present.  Skin:    General: Skin is warm and dry.     Capillary Refill: Capillary refill takes less than 2 seconds.     Findings: No erythema or rash.  Neurological:     Mental Status: She is alert and oriented to person, place, and time.     Motor: No abnormal muscle tone.     Coordination: Coordination normal.  Psychiatric:        Behavior: Behavior normal.      ED Treatments / Results  Labs (all labs ordered are listed, but only abnormal results are displayed) Labs Reviewed - No data to display  EKG None  Radiology No results found.  Procedures Procedures (including critical care time)  Medications Ordered in ED Medications - No data to display   Initial Impression / Assessment and Plan / ED Course  I have reviewed the triage vital signs and the nursing notes.  Pertinent labs & imaging results that were available during my care of the patient were reviewed by me and considered in my medical decision making (see chart for details).    Patient given enema here in the emergency department along with a Dulcolax suppository.  Patient be discharged home with therapies for constipation.  Advised to follow-up with her primary doctor told return here as needed.  Final Clinical Impressions(s) / ED Diagnoses   Final diagnoses:  None    ED Discharge  Orders    None       Dalia Heading, PA-C 10/06/18 1810    Malvin Johns, MD 10/06/18 Bosie Helper

## 2018-10-06 NOTE — Discharge Instructions (Addendum)
Return here as needed.  Follow-up with your doctor for recheck.  You can also drink warm prune juice.  Increase your fluid intake.

## 2018-10-06 NOTE — ED Notes (Signed)
Waiting on labs prior to performing CT

## 2018-10-06 NOTE — ED Notes (Signed)
Pt in radiology 

## 2018-10-06 NOTE — ED Triage Notes (Signed)
Pt reports no bm x 2 weeks, she states she has not called her pcp "he just tells me to take this and take that..." states she has been drinking a lot of water and taking miralax every 4 hours this week. Reports pain in her back with the constipation.

## 2018-10-13 DIAGNOSIS — N1831 Chronic kidney disease, stage 3a: Secondary | ICD-10-CM | POA: Insufficient documentation

## 2018-10-13 DIAGNOSIS — M549 Dorsalgia, unspecified: Secondary | ICD-10-CM | POA: Insufficient documentation

## 2018-10-13 DIAGNOSIS — N289 Disorder of kidney and ureter, unspecified: Secondary | ICD-10-CM | POA: Insufficient documentation

## 2018-10-13 HISTORY — DX: Disorder of kidney and ureter, unspecified: N28.9

## 2018-10-20 ENCOUNTER — Other Ambulatory Visit: Payer: Self-pay | Admitting: Physician Assistant

## 2018-10-20 ENCOUNTER — Ambulatory Visit
Admission: RE | Admit: 2018-10-20 | Discharge: 2018-10-20 | Disposition: A | Discharge status: Home / Self Care | Payer: Managed Care, Other (non HMO) | Source: Ambulatory Visit | Attending: Physician Assistant | Admitting: Physician Assistant

## 2018-10-20 DIAGNOSIS — M549 Dorsalgia, unspecified: Secondary | ICD-10-CM

## 2018-10-23 DIAGNOSIS — M5136 Other intervertebral disc degeneration, lumbar region: Secondary | ICD-10-CM | POA: Insufficient documentation

## 2018-10-23 DIAGNOSIS — M51369 Other intervertebral disc degeneration, lumbar region without mention of lumbar back pain or lower extremity pain: Secondary | ICD-10-CM | POA: Insufficient documentation

## 2018-10-23 DIAGNOSIS — M5030 Other cervical disc degeneration, unspecified cervical region: Secondary | ICD-10-CM | POA: Insufficient documentation

## 2018-10-26 ENCOUNTER — Other Ambulatory Visit: Payer: Self-pay | Admitting: Physician Assistant

## 2018-10-26 DIAGNOSIS — Z1231 Encounter for screening mammogram for malignant neoplasm of breast: Secondary | ICD-10-CM

## 2018-10-30 ENCOUNTER — Encounter: Payer: Self-pay | Admitting: Gastroenterology

## 2018-10-30 ENCOUNTER — Ambulatory Visit (INDEPENDENT_AMBULATORY_CARE_PROVIDER_SITE_OTHER): Payer: Managed Care, Other (non HMO) | Admitting: Gastroenterology

## 2018-10-30 VITALS — BP 152/80 | HR 102 | Ht 67.5 in | Wt 214.5 lb

## 2018-10-30 DIAGNOSIS — R140 Abdominal distension (gaseous): Secondary | ICD-10-CM | POA: Diagnosis not present

## 2018-10-30 DIAGNOSIS — K219 Gastro-esophageal reflux disease without esophagitis: Secondary | ICD-10-CM

## 2018-10-30 DIAGNOSIS — R159 Full incontinence of feces: Secondary | ICD-10-CM

## 2018-10-30 DIAGNOSIS — M6289 Other specified disorders of muscle: Secondary | ICD-10-CM

## 2018-10-30 DIAGNOSIS — K5902 Outlet dysfunction constipation: Secondary | ICD-10-CM

## 2018-10-30 DIAGNOSIS — R152 Fecal urgency: Secondary | ICD-10-CM

## 2018-10-30 NOTE — Patient Instructions (Signed)
You are doing a SITZ Marker study you will swallow the pill today and come back for Xray on tomorrow then in 3 days then in 5 days  We will refer you to Pelvic Floor therapy once you find out who you want to go to for in network provider  Take Fiberchoice 1 tablet twice a day  We will refer you to Leighton Ruff at [facility] and contact you with that appointment   If you are age 54 or older, your body mass index should be between 23-30. Your Body mass index is 33.1 kg/m. If this is out of the aforementioned range listed, please consider follow up with your Primary Care Provider.  If you are age 94 or younger, your body mass index should be between 19-25. Your Body mass index is 33.1 kg/m. If this is out of the aformentioned range listed, please consider follow up with your Primary Care Provider.    Thank you for choosing [facility]  Karleen Hampshire Nand[place]am,MD

## 2018-10-31 ENCOUNTER — Encounter: Payer: Self-pay | Admitting: Gastroenterology

## 2018-10-31 ENCOUNTER — Ambulatory Visit (INDEPENDENT_AMBULATORY_CARE_PROVIDER_SITE_OTHER)
Admission: RE | Admit: 2018-10-31 | Discharge: 2018-10-31 | Disposition: A | Discharge status: Home / Self Care | Payer: Managed Care, Other (non HMO) | Source: Ambulatory Visit | Attending: Gastroenterology | Admitting: Gastroenterology

## 2018-10-31 DIAGNOSIS — K5902 Outlet dysfunction constipation: Secondary | ICD-10-CM | POA: Diagnosis not present

## 2018-10-31 NOTE — Progress Notes (Addendum)
Keziyah Kneale    [id number]    16-Feb-1966  Primary Care Physician:Rehabilitation, [facility] &  Referring Physician: Rehabilitation, [facility] & No address on file  Chief complaint: Constipation, pelvic dysfunction HPI: 53 year old female with history of significant pelvic floor dysfunction, chronic constipation and fecal incontinence here for follow-up visit.  She was last seen in office July 27, 2018.  She had few sessions of pelvic floor physical therapy with Earlie Counts, no longer having fecal incontinence but has to digitally disimpact stool to have a bowel movement.  She is unable to spontaneously have a bowel movement.  Also having trouble urinating.   Denies any vomiting, abdominal pain, melena or bright red blood per rectum.  She has intermittent nausea, abdominal bloating and discomfort   She does not want to continue pelvic floor physical therapy.  She has been evaluated by GI at [place], [place] in the past.    Colonoscopy Feb 18, 2017: Normal  EGD 12/24/16 Mild chronic gastritis.  Negative for H. Pylori Distal esophageal biopsies showed inflamed gastric type mucosa with no Barrett's esophagus  CT abdomen and pelvis August 2018 showed fatty liver otherwise unremarkable. She  also had abdominal ultrasound with elastography did not show increased fibrosis  Outpatient Encounter Medications as of 10/30/2018  Medication Sig  . acetaminophen (TYLENOL) 650 MG CR tablet Take 1 tablet by mouth as needed.  Marland Kitchen aspirin EC 81 MG tablet Take 1 tablet by mouth daily.  Marland Kitchen atorvastatin (LIPITOR) 40 MG tablet Take 1 tablet by mouth daily.  Marland Kitchen escitalopram (LEXAPRO) 20 MG tablet Take 20 mg by mouth daily.  . Furosemide (LASIX PO) Take by mouth.  . Insulin Aspart (NOVOLOG [place]) Inject into the skin.  . Insulin Glargine (TOUJEO SOLOSTAR [place]) Inject into the skin.  Marland Kitchen lactulose (CHRONULAC) 10 GM/15ML solution  Take 15 mLs (10 g total) by mouth 2 (two) times daily.  Marland Kitchen lisinopril (PRINIVIL,ZESTRIL) 5 MG tablet Take 5 mg by mouth daily.  Marland Kitchen omeprazole (PRILOSEC) 20 MG capsule Take 1 capsule by mouth daily.  . Pregabalin (LYRICA PO) Take by mouth.  . Semaglutide,0.25 or 0.5MG /DOS, (OZEMPIC, 0.25 OR 0.5 MG/DOSE,) 2 MG/1.5ML SOPN Inject 1 Dose into the skin once a week.  Marland Kitchen VALACYCLOVIR HCL PO Take by mouth.  . [DISCONTINUED] bisacodyl (DULCOLAX) 10 MG suppository Place 1 suppository (10 mg total) rectally as needed for moderate constipation.  . [DISCONTINUED] docusate sodium (COLACE) 100 MG capsule Take 1 capsule (100 mg total) by mouth every 12 (twelve) hours.  . [DISCONTINUED] gabapentin (NEURONTIN) 300 MG capsule Take 300 mg by mouth 3 (three) times daily.   No facility-administered encounter medications on file as of 10/30/2018.     Allergies as of 10/30/2018 - Review Complete 10/30/2018  Allergen Reaction Noted  . Latex  04/07/2017    Past Medical History:  Diagnosis Date  . Anxiety   . Depression   . Diabetes mellitus without complication ([place])   . Sleep apnea    CPAP  . Stroke [facility])     Past Surgical History:  Procedure Laterality Date  . ABDOMINAL HYSTERECTOMY    . FOOT SURGERY Right     Family History  Problem Relation Age of Onset  . Diabetes Mother   . Heart disease Mother   . Diabetes Sister   . Diabetes Brother   . Diabetes Maternal Grandmother   . Diabetes Brother   .  Kidney failure Brother   . Kidney failure Sister     Social History   Socioeconomic History  . Marital status: Single    Spouse name: Not on file  . Number of children: 1  . Years of education: Not on file  . Highest education level: Not on file  Occupational History  . Not on file  Social Needs  . Financial resource strain: Not on file  . Food insecurity:    Worry: Not on file    Inability: Not on file  . Transportation needs:    Medical: Not on file    Non-medical: Not on file  Tobacco  Use  . Smoking status: Former Smoker    Types: Cigarettes    Last attempt to quit: 07/27/2012    Years since quitting: 6.2  . Smokeless tobacco: Never Used  Substance and Sexual Activity  . Alcohol use: Yes    Comment: occ  . Drug use: No  . Sexual activity: Not on file  Lifestyle  . Physical activity:    Days per week: Not on file    Minutes per session: Not on file  . Stress: Not on file  Relationships  . Social connections:    Talks on phone: Not on file    Gets together: Not on file    Attends religious service: Not on file    Active member of club or organization: Not on file    Attends meetings of clubs or organizations: Not on file    Relationship status: Not on file  . Intimate partner violence:    Fear of current or ex partner: Not on file    Emotionally abused: Not on file    Physically abused: Not on file    Forced sexual activity: Not on file  Other Topics Concern  . Not on file  Social History Narrative  . Not on file      Review of systems: Review of Systems  Constitutional: Negative for fever and chills.  HENT: Negative.   Eyes: Negative for blurred vision.  Respiratory: Negative for cough, shortness of breath and wheezing.   Cardiovascular: Negative for chest pain and palpitations.  Gastrointestinal: as per HPI Genitourinary: Negative for dysuria, urgency, frequency and hematuria.  Musculoskeletal: Positive for myalgias, back pain and joint pain.  Skin: Negative for itching and rash.  Neurological: Negative for dizziness, tremors, focal weakness, seizures and loss of consciousness.  Endo/Heme/Allergies: Positive for seasonal allergies.  Psychiatric/Behavioral: Negative for suicidal ideas and hallucinations.  Positive for insomnia and depression All other systems reviewed and are negative.   Physical Exam: Vitals:   10/30/18 0959  BP: (!) 152/80  Pulse: (!) 102   Body mass index is 33.1 kg/m. Gen:      No acute distress HEENT:  EOMI,  sclera anicteric Neck:     No masses; no thyromegaly Lungs:    Clear to auscultation bilaterally; normal respiratory effort CV:         Regular rate and rhythm; no murmurs Abd:      + bowel sounds; soft, non-tender; no palpable masses, no distension Ext:    No edema; adequate peripheral perfusion Skin:      Warm and dry; no rash Neuro: alert and oriented x 3 Psych: normal mood and affect  Data Reviewed:  Reviewed labs, radiology imaging, old records and pertinent past GI work up   Assessment and Plan/Recommendations:  53 year old female with obesity, anxiety, depression, diabetes with history of chronic constipation, fecal  incontinence and significant pelvic floor dysfunction  Fecal incontinence improved but she is currently having difficulty evacuating unless she does digital disimpaction  Increase dietary fiber, start Fiberchoice 1 tablet twice daily and increase if able to tolerate higher dose of fiber  Patient is very reluctant to do additional pelvic floor physical therapy.  She will contact us once she is able to find a provider that she is comfortable with and is in network for her insurance.  We will obtain sits marker study to exclude slow transit colon In addition to significant pelvic floor dysfunction.  Will refer to Dr. Leighton Ruff for evaluation for possible InterStim placement with history of fecal incontinence  Consider MRI defecography and anorectal manometry, but given higher out-of-pocket expense due to her insurance, will hold off.  Due for colorectal cancer screening 2028  GERD: Stable Continue antireflux measures and omeprazole daily  Greater than 50% of the time used for counseling as well as treatment plan and follow-up. She had multiple questions which were answered to her satisfaction  K. Denzil Magnuson , MD [phone]    CC: [facility]

## 2018-11-01 ENCOUNTER — Other Ambulatory Visit: Payer: Self-pay | Admitting: *Deleted

## 2018-11-01 DIAGNOSIS — K5909 Other constipation: Secondary | ICD-10-CM

## 2018-11-03 ENCOUNTER — Ambulatory Visit (INDEPENDENT_AMBULATORY_CARE_PROVIDER_SITE_OTHER)
Admission: RE | Admit: 2018-11-03 | Discharge: 2018-11-03 | Disposition: A | Discharge status: Home / Self Care | Payer: Managed Care, Other (non HMO) | Source: Ambulatory Visit | Attending: Gastroenterology | Admitting: Gastroenterology

## 2018-11-03 DIAGNOSIS — K5909 Other constipation: Secondary | ICD-10-CM | POA: Diagnosis not present

## 2018-11-06 ENCOUNTER — Ambulatory Visit (INDEPENDENT_AMBULATORY_CARE_PROVIDER_SITE_OTHER)
Admission: RE | Admit: 2018-11-06 | Discharge: 2018-11-06 | Disposition: A | Discharge status: Home / Self Care | Payer: Managed Care, Other (non HMO) | Source: Ambulatory Visit | Attending: Gastroenterology | Admitting: Gastroenterology

## 2018-11-06 DIAGNOSIS — S93622S Sprain of tarsometatarsal ligament of left foot, sequela: Secondary | ICD-10-CM

## 2018-11-06 DIAGNOSIS — M79672 Pain in left foot: Secondary | ICD-10-CM | POA: Insufficient documentation

## 2018-11-06 DIAGNOSIS — K5909 Other constipation: Secondary | ICD-10-CM | POA: Diagnosis not present

## 2018-11-06 HISTORY — DX: Sprain of tarsometatarsal ligament of left foot, sequela: S93.622S

## 2018-11-17 ENCOUNTER — Telehealth: Payer: Self-pay | Admitting: Gastroenterology

## 2018-11-17 NOTE — Telephone Encounter (Signed)
Patient is advised.  

## 2018-11-17 NOTE — Telephone Encounter (Signed)
Patient complains of a belch and a terrible smell. She associates this with the first sign she will have diarrhea. Her last bowel movement was this morning. It was soft but not loose which is another "symptom." Her stomach is "grumbling" and she does not have an appetite.  She is taking Omeprazole.

## 2018-11-17 NOTE — Telephone Encounter (Signed)
Please have her call back if she develops diarrhea. Continue Omeprazole. Phazyme 1 capsule as needed for excessive belching.

## 2018-11-20 ENCOUNTER — Telehealth: Payer: Self-pay | Admitting: *Deleted

## 2018-11-20 NOTE — Telephone Encounter (Signed)
Patient has been schedulded at [facility] to see A Thomas on 12/04/2018 at 1:40pm   Patient aware

## 2018-11-21 ENCOUNTER — Telehealth: Payer: Self-pay | Admitting: Gastroenterology

## 2018-11-21 NOTE — Telephone Encounter (Signed)
Diarrhea has stopped "for now." Last vomited at noon. She has been NPO since then. No interest in food.  She will stop the Phazyme in case it contributed to her symptoms. Recommendations? [facility] appointment is 11/24/2018

## 2018-11-22 NOTE — Telephone Encounter (Signed)
Please schedule MRI defecography with [facility] to evaluate for for possible enterocoele in addition to the severe pelvic floor dysfunction. Thanks

## 2018-11-24 ENCOUNTER — Ambulatory Visit
Admission: RE | Admit: 2018-11-24 | Discharge: 2018-11-24 | Disposition: A | Discharge status: Home / Self Care | Payer: Managed Care, Other (non HMO) | Source: Ambulatory Visit | Attending: Physician Assistant | Admitting: Physician Assistant

## 2018-11-24 ENCOUNTER — Telehealth: Payer: Self-pay | Admitting: Gastroenterology

## 2018-11-24 DIAGNOSIS — Z1231 Encounter for screening mammogram for malignant neoplasm of breast: Secondary | ICD-10-CM

## 2018-11-24 IMAGING — MG DIGITAL SCREENING BILATERAL MAMMOGRAM WITH TOMO AND CAD
8 series · 8 of 24 positions shown · non-contrast
Comparison: Previous exam(s).

CLINICAL DATA: Screening.

EXAM:
DIGITAL SCREENING BILATERAL MAMMOGRAM WITH TOMO AND CAD

[L CC synth-2D]
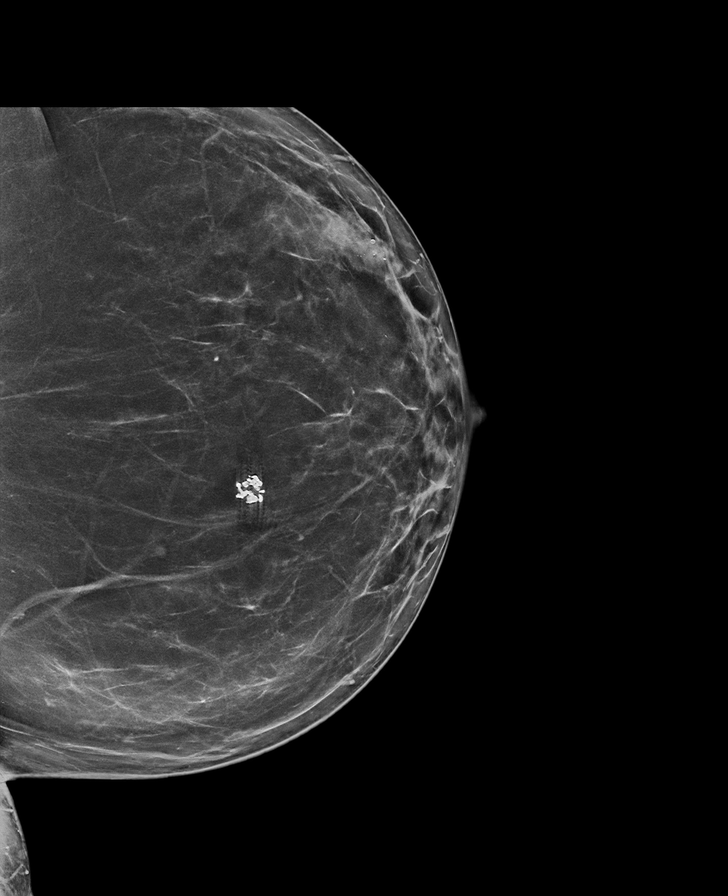

[R MLO synth-2D]
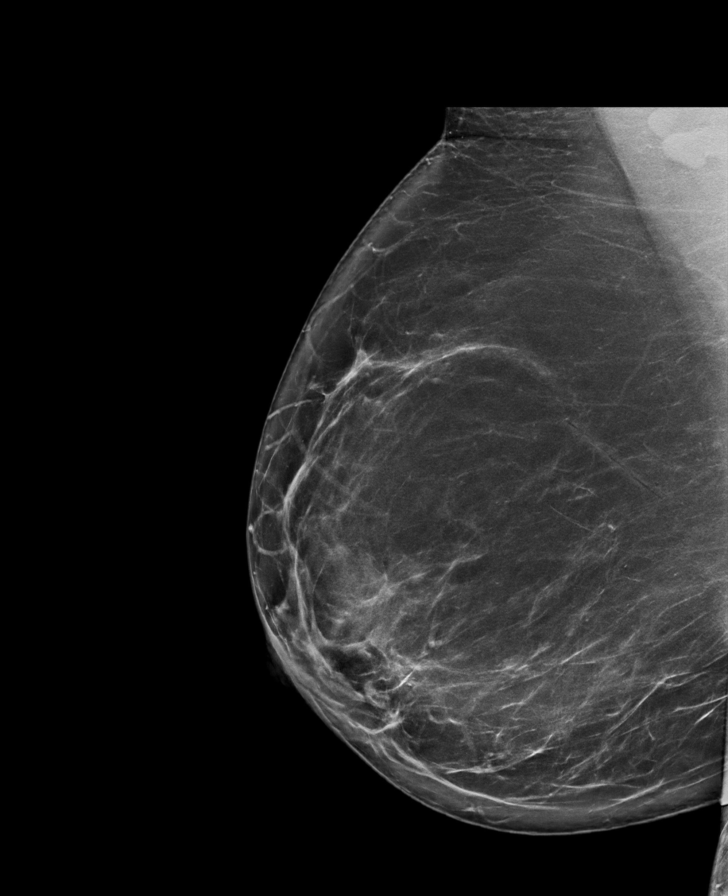

[L MLO synth-2D]
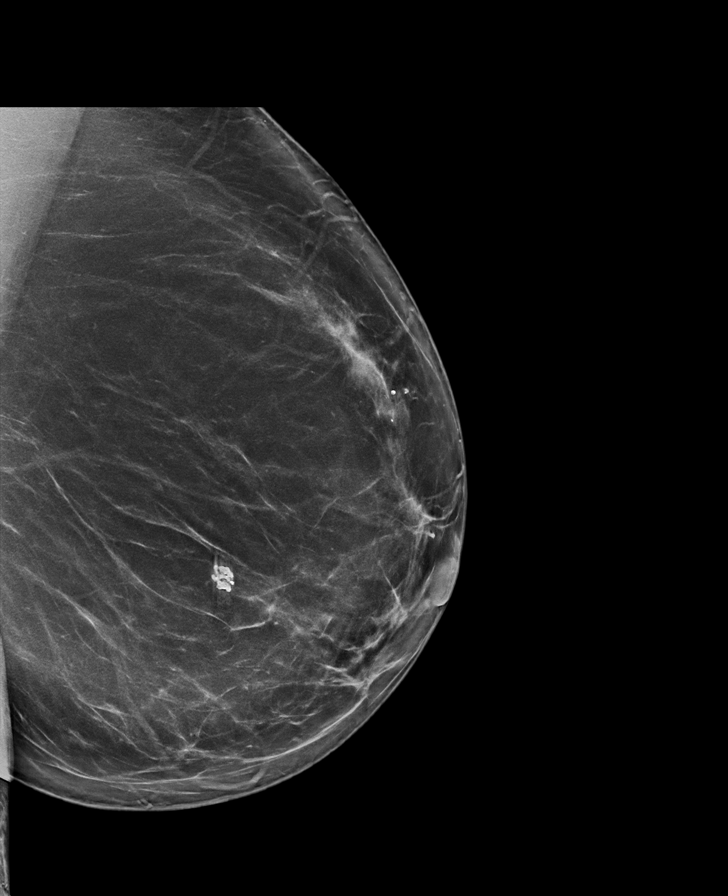

[R CC synth-2D]
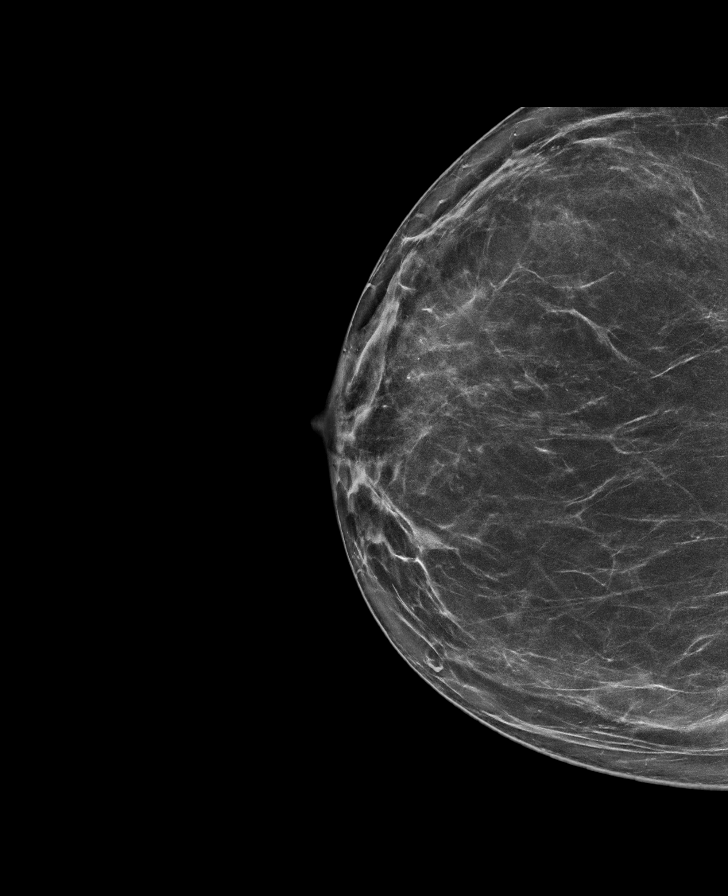

[L CC tomo · tomo slice 45/90.0]
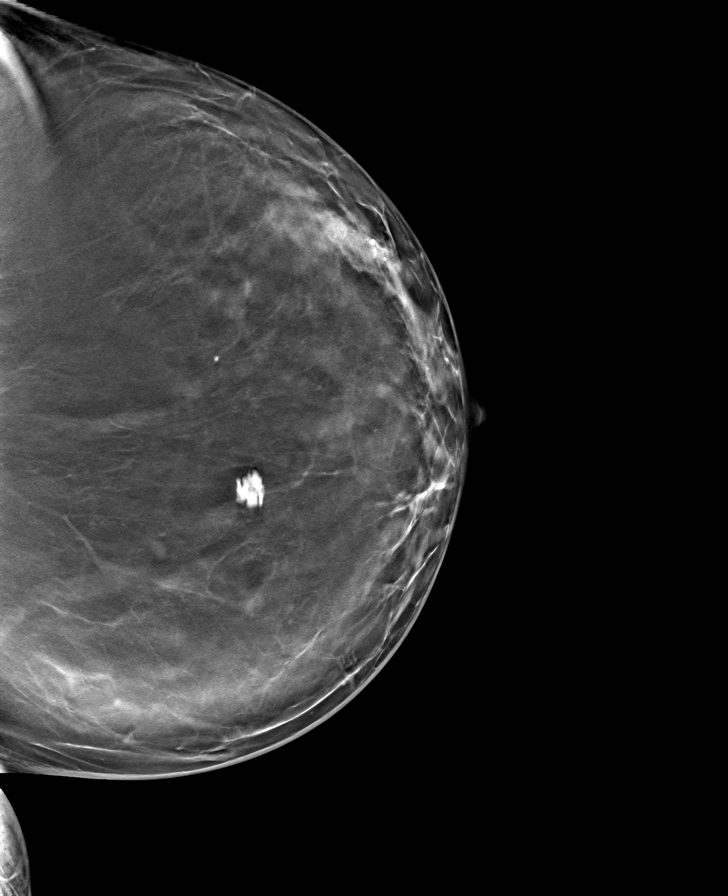

[L MLO tomo · tomo slice 52/103.0]
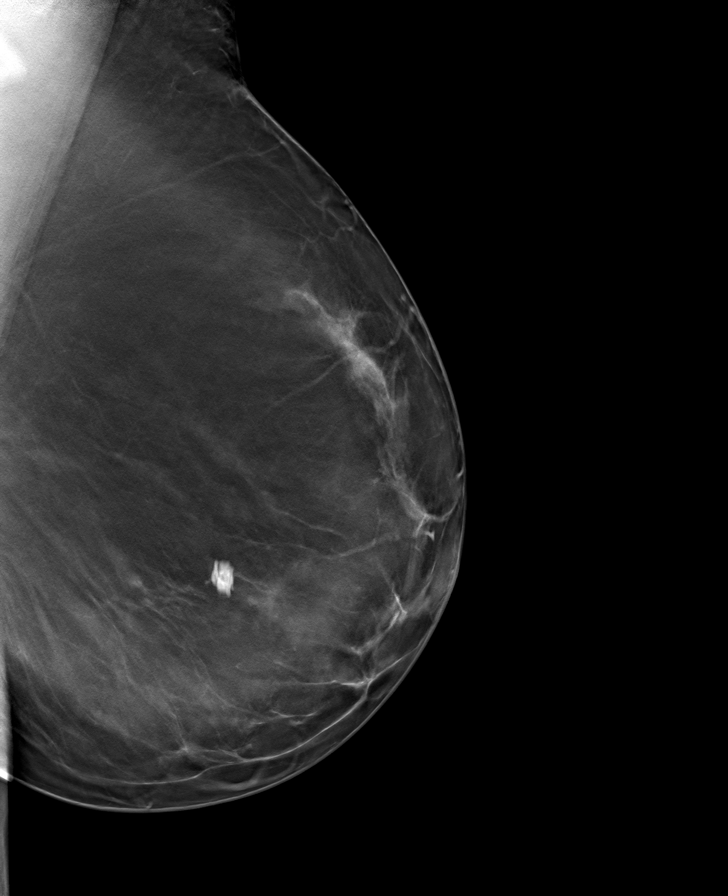

[R MLO tomo · tomo slice 51/101.0]
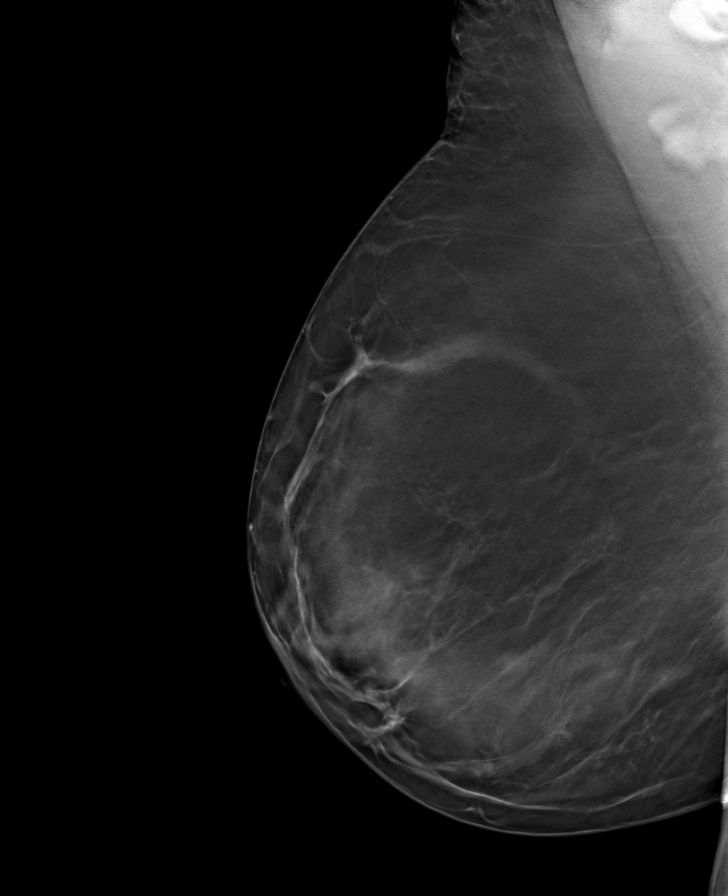

[R CC tomo · tomo slice 43/84.0]
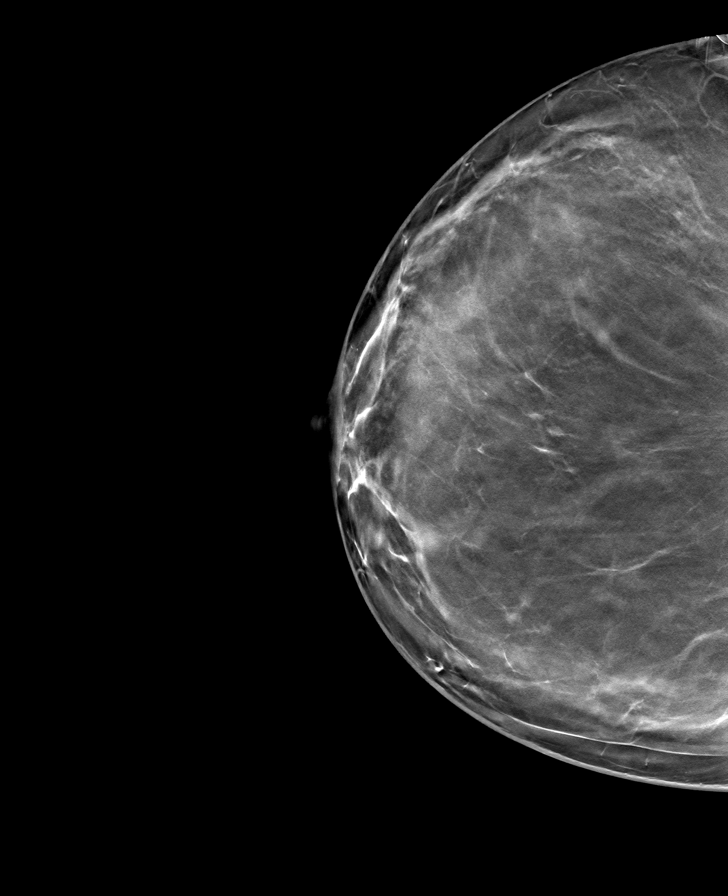

[8 of 24 positions shown; findings below may reference images not displayed]

ACR Breast Density Category b: There are scattered areas of
fibroglandular density.
FINDINGS: There are no findings suspicious for malignancy. Images were
processed with CAD.
IMPRESSION: No mammographic evidence of malignancy. A result letter of this
screening mammogram will be mailed directly to the patient.

RECOMMENDATION:
Screening mammogram in one year. (Code:[TQ])

BI-RADS CATEGORY  1: Negative.

## 2018-11-24 NOTE — Telephone Encounter (Signed)
Pt stated she wanted the nurse to call her she wanted to asked her something about what they discuss earlier

## 2018-11-24 NOTE — Telephone Encounter (Signed)
Agrees to the Defecography.

## 2018-11-24 NOTE — Telephone Encounter (Signed)
Patient states she had an MRI of her back this week. She is not sure she wants to do any other imaging at this time. She is not having any diarrhea at this time. Surgical appointment is 12/04/2018. She agrees to consider and call me if she has further questions or wants to go forward with testing.

## 2018-11-27 ENCOUNTER — Telehealth: Payer: Self-pay | Admitting: Gastroenterology

## 2018-11-27 ENCOUNTER — Other Ambulatory Visit: Payer: Self-pay

## 2018-11-27 DIAGNOSIS — R152 Fecal urgency: Secondary | ICD-10-CM

## 2018-11-27 DIAGNOSIS — M6289 Other specified disorders of muscle: Secondary | ICD-10-CM

## 2018-11-27 DIAGNOSIS — R159 Full incontinence of feces: Secondary | ICD-10-CM

## 2018-11-27 NOTE — Telephone Encounter (Signed)
Pt requested a CB to discuss her new MRI.

## 2018-11-28 NOTE — Telephone Encounter (Signed)
Patient is being contacted today by [facility] to schedule her MRI. She reports she had a "bad weekend" with belching, the bad smell and explosive diarrhea. These are the symptoms that she is wanting to find the cause and treatment for. I indicated to her the test will hopefully give answers to these questions. Treatments and medications so far have not been helpful to her. She thanks me for the call. MRI is 12/11/2018

## 2018-11-28 NOTE — Telephone Encounter (Signed)
Pt called to inquire about being scheduled for MRI.

## 2018-12-06 ENCOUNTER — Other Ambulatory Visit: Payer: Self-pay

## 2018-12-06 DIAGNOSIS — K5902 Outlet dysfunction constipation: Secondary | ICD-10-CM

## 2018-12-06 DIAGNOSIS — M6289 Other specified disorders of muscle: Secondary | ICD-10-CM

## 2018-12-07 ENCOUNTER — Other Ambulatory Visit: Payer: Self-pay | Admitting: Gastroenterology

## 2018-12-11 ENCOUNTER — Ambulatory Visit
Admission: RE | Admit: 2018-12-11 | Discharge: 2018-12-11 | Disposition: A | Discharge status: Home / Self Care | Payer: Managed Care, Other (non HMO) | Source: Ambulatory Visit | Attending: Gastroenterology | Admitting: Gastroenterology

## 2018-12-11 DIAGNOSIS — R159 Full incontinence of feces: Secondary | ICD-10-CM

## 2018-12-11 DIAGNOSIS — R152 Fecal urgency: Secondary | ICD-10-CM

## 2018-12-11 DIAGNOSIS — M6289 Other specified disorders of muscle: Secondary | ICD-10-CM

## 2018-12-13 ENCOUNTER — Telehealth: Payer: Self-pay | Admitting: Gastroenterology

## 2018-12-13 NOTE — Telephone Encounter (Signed)
Advised of the need for PT for pelvic floor muscle training.  Patient says she wants to discuss this with the doctor first. She declines the referral to PT. States she has done it before and it was unsuccessful.  I am monitoring for a cancellation. Presently there are no appointments available.

## 2018-12-13 NOTE — Telephone Encounter (Signed)
Cancellation tomorrow morning at 8:30 am. Patient accepts this appointment.

## 2018-12-13 NOTE — Telephone Encounter (Signed)
Okay, please schedule next available appointment.

## 2018-12-13 NOTE — Telephone Encounter (Signed)
Pt called in wanting to speak with nurse about Mri results.

## 2018-12-14 ENCOUNTER — Encounter: Payer: Self-pay | Admitting: Gastroenterology

## 2018-12-14 ENCOUNTER — Ambulatory Visit (INDEPENDENT_AMBULATORY_CARE_PROVIDER_SITE_OTHER): Payer: Managed Care, Other (non HMO) | Admitting: Gastroenterology

## 2018-12-14 VITALS — BP 102/60 | HR 100 | Ht 66.5 in | Wt 209.4 lb

## 2018-12-14 DIAGNOSIS — M6289 Other specified disorders of muscle: Secondary | ICD-10-CM | POA: Diagnosis not present

## 2018-12-14 DIAGNOSIS — K5902 Outlet dysfunction constipation: Secondary | ICD-10-CM | POA: Diagnosis not present

## 2018-12-14 DIAGNOSIS — R159 Full incontinence of feces: Secondary | ICD-10-CM | POA: Diagnosis not present

## 2018-12-14 DIAGNOSIS — R152 Fecal urgency: Secondary | ICD-10-CM | POA: Diagnosis not present

## 2018-12-14 NOTE — Progress Notes (Signed)
Helen Perez    [id number]    January 02, 1966  Primary Care Physician:Rehabilitation, [facility] &  Referring Physician: Rehabilitation, [facility] & No address on file  Chief complaint:  Fecal incontinence  HPI: 53 year old female here for follow up visit. Last seen in office Oct 30, 2018.  She never has a spontaneous bowel movement. She either has to digitally disimpact and dig out hard stool or she has episodes of fecal full incontinence in her sleep. She has to wear adult diapers but is unable to contain the stool and wakes her up.   She has had extensive GI work at [facility], [facility] and [facility] before she established her care here in October 2019.  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 Small rectocele otherwise no other abnormality.  Sitz marker study negative for delayed colonic transit  Anorectal Manometry Oct 25, 2017 Weak internal and external sphincter RAIR is present Abnormal rectal sensation with elevated volume for first sensation ?not high resolution manometry , I am not familiar with interpretation. ?strain maneuver reveals a decrease in pelvic floor.     ColonoscopyMay 4, 2018: Normal  EGD 12/24/16 Mild chronic gastritis.  Negative for H. Pylori Distal esophageal biopsies showed inflamed gastric type mucosa with no Barrett's esophagus  CT abdomen and pelvis August 2018 showed fatty liver otherwise unremarkable. She  also had abdominal ultrasound with elastography did not show increased fibrosis   Outpatient Encounter Medications as of 12/14/2018  Medication Sig  . acetaminophen (TYLENOL) 650 MG CR tablet Take 1 tablet by mouth as needed.  Marland Kitchen aspirin EC 81 MG tablet Take 1 tablet by mouth daily.  Marland Kitchen atorvastatin (LIPITOR) 40 MG tablet Take 1 tablet by mouth daily.  . baclofen (LIORESAL) 10 MG tablet Take 1 tablet by mouth 3  (three) times daily.  . furosemide (LASIX) 20 MG tablet Take 1 tablet by mouth daily.  . Insulin Aspart (NOVOLOG [place]) Inject into the skin.  . Insulin Glargine, 1 Unit Dial, (TOUJEO SOLOSTAR) 300 UNIT/ML SOPN Inject 70 Units into the skin daily.  Marland Kitchen lactulose (CHRONULAC) 10 GM/15ML solution Take 15 mLs (10 g total) by mouth 2 (two) times daily.  Marland Kitchen lisinopril (PRINIVIL,ZESTRIL) 5 MG tablet Take 5 mg by mouth daily.  Marland Kitchen omeprazole (PRILOSEC) 20 MG capsule Take 1 capsule by mouth daily.  . Semaglutide,0.25 or 0.5MG /DOS, (OZEMPIC, 0.25 OR 0.5 MG/DOSE,) 2 MG/1.5ML SOPN Inject 1 Dose into the skin once a week.  Marland Kitchen VALACYCLOVIR HCL PO Take by mouth.  . [DISCONTINUED] escitalopram (LEXAPRO) 20 MG tablet Take 20 mg by mouth daily.  . [DISCONTINUED] Furosemide (LASIX PO) Take by mouth.  . [DISCONTINUED] Insulin Glargine (TOUJEO SOLOSTAR [place]) Inject into the skin.  . [DISCONTINUED] Pregabalin (LYRICA PO) Take by mouth.   No facility-administered encounter medications on file as of 12/14/2018.     Allergies as of 12/14/2018 - Review Complete 12/14/2018  Allergen Reaction Noted  . Latex  04/07/2017    Past Medical History:  Diagnosis Date  . Anxiety   . Depression   . Diabetes mellitus without complication ([place])   . Sleep apnea    CPAP  . Stroke [facility])     Past Surgical History:  Procedure Laterality Date  . ABDOMINAL HYSTERECTOMY    . FOOT SURGERY Right     Family History  Problem Relation Age of Onset  . Diabetes Mother   .  Heart disease Mother   . Diabetes Sister   . Diabetes Brother   . Diabetes Maternal Grandmother   . Diabetes Brother   . Kidney failure Brother   . Kidney failure Sister     Social History   Socioeconomic History  . Marital status: Single    Spouse name: Not on file  . Number of children: 1  . Years of education: Not on file  . Highest education level: Not on file  Occupational History  . Not on file  Social Needs  . Financial resource strain: Not on  file  . Food insecurity:    Worry: Not on file    Inability: Not on file  . Transportation needs:    Medical: Not on file    Non-medical: Not on file  Tobacco Use  . Smoking status: Former Smoker    Types: Cigarettes    Last attempt to quit: 07/27/2012    Years since quitting: 6.3  . Smokeless tobacco: Never Used  Substance and Sexual Activity  . Alcohol use: Yes    Comment: occ  . Drug use: No  . Sexual activity: Not on file  Lifestyle  . Physical activity:    Days per week: Not on file    Minutes per session: Not on file  . Stress: Not on file  Relationships  . Social connections:    Talks on phone: Not on file    Gets together: Not on file    Attends religious service: Not on file    Active member of club or organization: Not on file    Attends meetings of clubs or organizations: Not on file    Relationship status: Not on file  . Intimate partner violence:    Fear of current or ex partner: Not on file    Emotionally abused: Not on file    Physically abused: Not on file    Forced sexual activity: Not on file  Other Topics Concern  . Not on file  Social History Narrative  . Not on file      Review of systems: Review of Systems  Constitutional: Negative for fever and chills.  HENT: Negative.   Eyes: Negative for blurred vision.  Respiratory: Negative for cough, shortness of breath and wheezing.   Cardiovascular: Negative for chest pain and palpitations.  Gastrointestinal: as per HPI Genitourinary: Negative for dysuria, urgency, frequency and hematuria.  Musculoskeletal: Positive for myalgias, back pain and joint pain.  Skin: Negative for itching and rash.  Neurological: Negative for dizziness, tremors, focal weakness, seizures and loss of consciousness.  Endo/Heme/Allergies: Negative for seasonal allergies.  Psychiatric/Behavioral: Negative for depression, suicidal ideas and hallucinations.  All other systems reviewed and are negative.   Physical  Exam: Vitals:   12/14/18 0840  BP: 102/60  Pulse: 100   Body mass index is 33.29 kg/m. Gen:      No acute distress HEENT:  EOMI, sclera anicteric Neck:     No masses; no thyromegaly Lungs:    Clear to auscultation bilaterally; normal respiratory effort CV:         Regular rate and rhythm; no murmurs Abd:      + bowel sounds; soft, non-tender; no palpable masses, no distension Ext:    No edema; adequate peripheral perfusion Skin:      Warm and dry; no rash Neuro: alert and oriented x 3 Psych: normal mood and affect  Data Reviewed:  Reviewed labs, radiology imaging, old records and pertinent past GI  work up   Assessment and Plan/Recommendations:  53 year old female with obesity, anxiety, depression, diabetes, constipation alternating diarrhea and fecal incontinence.  She has had extensive work-up with findings of severe pelvic floor dysfunction and dyssynergy defecation Patient is reluctant to undergo pelvic floor physical therapy.  Given she is unable to evacuate spontaneously, laxatives unlikely to improve the symptoms without pelvic floor physical therapy. Discussed with patient that only other alternative would likely be a colostomy, a referral was sent to [facility] last office visit.  Patient will call us back once she decides if she wants to proceed with pelvic floor PT and biofeedback   25 minutes was spent face-to-face with the patient. Greater than 50% of the time used for counseling as well as treatment plan and follow-up. She had multiple questions which were answered to her satisfaction  K. Denzil Magnuson , MD [phone]    CC: [facility]

## 2018-12-14 NOTE — Patient Instructions (Signed)
We will refer you for Pelvic floor therapy and biofeedback and contact you with that appointment   If you are age 54 or older, your body mass index should be between 23-30. Your Body mass index is 33.29 kg/m. If this is out of the aforementioned range listed, please consider follow up with your Primary Care Provider.  If you are age 75 or younger, your body mass index should be between 19-25. Your Body mass index is 33.29 kg/m. If this is out of the aformentioned range listed, please consider follow up with your Primary Care Provider.    I appreciate the  opportunity to care for you  Thank You   Harl Bowie , MD

## 2018-12-25 ENCOUNTER — Other Ambulatory Visit: Payer: Self-pay

## 2018-12-25 ENCOUNTER — Emergency Department (HOSPITAL_BASED_OUTPATIENT_CLINIC_OR_DEPARTMENT_OTHER): Payer: Managed Care, Other (non HMO)

## 2018-12-25 ENCOUNTER — Encounter (HOSPITAL_BASED_OUTPATIENT_CLINIC_OR_DEPARTMENT_OTHER): Payer: Self-pay | Admitting: Emergency Medicine

## 2018-12-25 ENCOUNTER — Emergency Department (HOSPITAL_BASED_OUTPATIENT_CLINIC_OR_DEPARTMENT_OTHER)
Admission: EM | Admit: 2018-12-25 | Discharge: 2018-12-25 | Disposition: A | Discharge status: Home / Self Care | Payer: Managed Care, Other (non HMO) | Attending: Emergency Medicine | Admitting: Emergency Medicine

## 2018-12-25 DIAGNOSIS — E119 Type 2 diabetes mellitus without complications: Secondary | ICD-10-CM | POA: Insufficient documentation

## 2018-12-25 DIAGNOSIS — H538 Other visual disturbances: Secondary | ICD-10-CM | POA: Insufficient documentation

## 2018-12-25 DIAGNOSIS — S0280XA Fracture of other specified skull and facial bones, unspecified side, initial encounter for closed fracture: Secondary | ICD-10-CM | POA: Insufficient documentation

## 2018-12-25 DIAGNOSIS — W19XXXA Unspecified fall, initial encounter: Secondary | ICD-10-CM

## 2018-12-25 DIAGNOSIS — Z87891 Personal history of nicotine dependence: Secondary | ICD-10-CM | POA: Diagnosis not present

## 2018-12-25 DIAGNOSIS — Y92019 Unspecified place in single-family (private) house as the place of occurrence of the external cause: Secondary | ICD-10-CM | POA: Insufficient documentation

## 2018-12-25 DIAGNOSIS — Z91040 Latex allergy status: Secondary | ICD-10-CM | POA: Diagnosis not present

## 2018-12-25 DIAGNOSIS — Y998 Other external cause status: Secondary | ICD-10-CM | POA: Diagnosis not present

## 2018-12-25 DIAGNOSIS — S0990XA Unspecified injury of head, initial encounter: Secondary | ICD-10-CM

## 2018-12-25 DIAGNOSIS — Y9301 Activity, walking, marching and hiking: Secondary | ICD-10-CM | POA: Insufficient documentation

## 2018-12-25 DIAGNOSIS — S0285XA Fracture of orbit, unspecified, initial encounter for closed fracture: Secondary | ICD-10-CM

## 2018-12-25 DIAGNOSIS — W01198A Fall on same level from slipping, tripping and stumbling with subsequent striking against other object, initial encounter: Secondary | ICD-10-CM | POA: Insufficient documentation

## 2018-12-25 DIAGNOSIS — Z8673 Personal history of transient ischemic attack (TIA), and cerebral infarction without residual deficits: Secondary | ICD-10-CM | POA: Insufficient documentation

## 2018-12-25 MED ORDER — FLUORESCEIN SODIUM 1 MG OP STRP
1.0000 | ORAL_STRIP | Freq: Once | OPHTHALMIC | Status: AC
  Administered 2018-12-25: 1 via OPHTHALMIC
  Filled 2018-12-25: qty 1

## 2018-12-25 MED ORDER — OXYCODONE-ACETAMINOPHEN 5-325 MG PO TABS: 1.0000 | ORAL_TABLET | Freq: Four times a day (QID) | ORAL | 0 refills | Status: DC | PRN

## 2018-12-25 MED ORDER — CEPHALEXIN 500 MG PO CAPS: 500.0000 mg | ORAL_CAPSULE | Freq: Three times a day (TID) | ORAL | 0 refills | Status: AC

## 2018-12-25 MED ORDER — TETRACAINE HCL 0.5 % OP SOLN
2.0000 [drp] | Freq: Once | OPHTHALMIC | Status: AC
  Administered 2018-12-25: 2 [drp] via OPHTHALMIC
  Filled 2018-12-25: qty 4

## 2018-12-25 NOTE — ED Triage Notes (Signed)
Pt tripped and fell this morning, hitting her left eye and nose on the floor.  Swelling, discoloration and pain around left eye.

## 2018-12-25 NOTE — ED Provider Notes (Signed)
Emergency Department Provider Note   I have reviewed the triage vital signs and the nursing notes.   HISTORY  Chief Complaint Eye Injury   HPI Helen Perez is a 53 y.o. female with PMH of DM presents to the emergency department for evaluation after mechanical fall this morning with left face injury.  Patient states that she struck her left eye/face on the floor after tripping over something while trying to walk to the bathroom.  She has had swelling around the eye with some blurry vision.  She does have pain especially with looking up.  No double vision.  No posterior headache or neck pain.  She takes an aspirin but no anticoagulation.  Denies chest pain.  No pain in the arms, legs, abdomen, back.   Past Medical History:  Diagnosis Date  . Anxiety   . Depression   . Diabetes mellitus without complication ([place])   . Sleep apnea    CPAP  . Stroke [facility])     There are no active problems to display for this patient.   Past Surgical History:  Procedure Laterality Date  . ABDOMINAL HYSTERECTOMY    . FOOT SURGERY Right     Allergies Latex  Family History  Problem Relation Age of Onset  . Diabetes Mother   . Heart disease Mother   . Diabetes Sister   . Kidney failure Sister   . Other Brother        quadraplegia  . Diabetes Maternal Grandmother   . Diabetes Brother   . Kidney failure Brother   . Diabetes Brother   . Colon cancer Neg Hx   . Esophageal cancer Neg Hx   . Pancreatic cancer Neg Hx   . Stomach cancer Neg Hx   . Liver disease Neg Hx     Social History Social History   Tobacco Use  . Smoking status: Former Smoker    Types: Cigarettes    Last attempt to quit: 07/27/2012    Years since quitting: 6.4  . Smokeless tobacco: Never Used  Substance Use Topics  . Alcohol use: Yes    Comment: occ  . Drug use: No    Review of Systems  Constitutional: No fever/chills Eyes: Mild blurry vision left eye. No double vision. Pain noted.  ENT: No sore  throat. Cardiovascular: Denies chest pain. Respiratory: Denies shortness of breath. Gastrointestinal: No abdominal pain.  No nausea, no vomiting.  No diarrhea.  No constipation. Genitourinary: Negative for dysuria. Musculoskeletal: Negative for back pain. Skin: Negative for rash. Neurological: Negative for focal weakness or numbness. Positive HA.   10-point ROS otherwise negative.  ____________________________________________   PHYSICAL EXAM:  VITAL SIGNS: ED Triage Vitals [12/25/18 0841]  Enc Vitals Group     BP 140/83     Pulse Rate (!) 113     Resp 16     Temp 97.8 F (36.6 C)     Temp Source Oral     SpO2 100 %     Weight 209 lb (94.8 kg)     Height 5' 7.5" (1.715 m)    Constitutional: Alert and oriented. Well appearing and in no acute distress. Eyes: Conjunctivae are normal. PERRL. EOMI. Normal fluorescein uptake. No evidence of globe injury. No proptosis. Mild left periorbital edema.  Head: Mild left forehead hematoma with linear abrasion noted. No laceration.   Nose: No congestion/rhinnorhea. Mouth/Throat: Mucous membranes are moist.  Neck: No stridor. No cervical spine tenderness to palpation. Cardiovascular: Normal rate, regular rhythm. Good  peripheral circulation. Grossly normal heart sounds.   Respiratory: Normal respiratory effort.  No retractions. Lungs CTAB. Gastrointestinal: Soft and nontender. No distention.  Musculoskeletal: No lower extremity tenderness nor edema. No gross deformities of extremities. Neurologic:  Normal speech and language. No gross focal neurologic deficits are appreciated.  Skin:  Skin is warm, dry and intact. No rash noted.  ____________________________________________  RADIOLOGY  Ct Head Wo Contrast  Result Date: 12/25/2018 CLINICAL DATA:  Status post trip and fall this morning with a blow to the left eye and nose. Pain and swelling about the left eye. Initial encounter. EXAM: CT HEAD WITHOUT CONTRAST CT MAXILLOFACIAL WITHOUT  CONTRAST CT ORBITS WITHOUT CONTRAST TECHNIQUE: Multidetector CT imaging of the head, orbits and maxillofacial structures were performed using the standard protocol without intravenous contrast. Multiplanar CT image reconstructions of the maxillofacial structures were also generated. COMPARISON:  Head CT scan 06/04/2015. FINDINGS: CT HEAD FINDINGS Brain: No evidence of acute infarction, hemorrhage, hydrocephalus, extra-axial collection or mass lesion/mass effect. Remote right frontal infarct is unchanged. Vascular: No hyperdense vessel or unexpected calcification. Skull: Intact.  No focal lesion. Other: None. CT MAXILLOFACIAL FINDINGS Osseous: The patient has an acute fracture of the inferior wall of the left orbit. The fracture is approximately 0.8 cm medial to the lateral wall of the left orbit with inferior displacement of 0.7 cm. No other facial bone fracture is identified. Mandibular condyles are located. Orbits: A small amount orbital emphysema on the left related of to the patient's fracture is identified. The globes are intact and lenses are located. Negative for extraocular muscle entrapment. Sinuses: Hemorrhage in the left maxillary sinus related to the patient's fracture is noted. Small mucous retention cyst or polyp in the right maxillary sinus is seen. Soft tissues: Soft tissue contusion about the left eye is identified. CT ORBITS FINDINGS Orbits: Fracture of the inferior wall of the left orbit is seen as described above. Visualized sinuses: Hemorrhage in the left maxillary sinus is noted. Small mucous retention cyst or polyp right maxillary sinus is seen. Soft tissues: Soft tissue contusion is seen about the left eye. IMPRESSION: Depressed fracture of the inferior wall of the left orbit with associated hemorrhage in the left maxillary sinus. Negative for extraocular muscle entrapment. No other acute abnormality. Remote right frontal infarct. Electronically Signed   By: Inge Rise M.D.   On:  12/25/2018 10:13   Ct Maxillofacial Wo Contrast  Result Date: 12/25/2018 CLINICAL DATA:  Status post trip and fall this morning with a blow to the left eye and nose. Pain and swelling about the left eye. Initial encounter. EXAM: CT HEAD WITHOUT CONTRAST CT MAXILLOFACIAL WITHOUT CONTRAST CT ORBITS WITHOUT CONTRAST TECHNIQUE: Multidetector CT imaging of the head, orbits and maxillofacial structures were performed using the standard protocol without intravenous contrast. Multiplanar CT image reconstructions of the maxillofacial structures were also generated. COMPARISON:  Head CT scan 06/04/2015. FINDINGS: CT HEAD FINDINGS Brain: No evidence of acute infarction, hemorrhage, hydrocephalus, extra-axial collection or mass lesion/mass effect. Remote right frontal infarct is unchanged. Vascular: No hyperdense vessel or unexpected calcification. Skull: Intact.  No focal lesion. Other: None. CT MAXILLOFACIAL FINDINGS Osseous: The patient has an acute fracture of the inferior wall of the left orbit. The fracture is approximately 0.8 cm medial to the lateral wall of the left orbit with inferior displacement of 0.7 cm. No other facial bone fracture is identified. Mandibular condyles are located. Orbits: A small amount orbital emphysema on the left related of to the patient's fracture is  identified. The globes are intact and lenses are located. Negative for extraocular muscle entrapment. Sinuses: Hemorrhage in the left maxillary sinus related to the patient's fracture is noted. Small mucous retention cyst or polyp in the right maxillary sinus is seen. Soft tissues: Soft tissue contusion about the left eye is identified. CT ORBITS FINDINGS Orbits: Fracture of the inferior wall of the left orbit is seen as described above. Visualized sinuses: Hemorrhage in the left maxillary sinus is noted. Small mucous retention cyst or polyp right maxillary sinus is seen. Soft tissues: Soft tissue contusion is seen about the left eye.  IMPRESSION: Depressed fracture of the inferior wall of the left orbit with associated hemorrhage in the left maxillary sinus. Negative for extraocular muscle entrapment. No other acute abnormality. Remote right frontal infarct. Electronically Signed   By: Inge Rise M.D.   On: 12/25/2018 10:13   Ct Orbits Wo Contrast  Result Date: 12/25/2018 CLINICAL DATA:  Status post trip and fall this morning with a blow to the left eye and nose. Pain and swelling about the left eye. Initial encounter. EXAM: CT HEAD WITHOUT CONTRAST CT MAXILLOFACIAL WITHOUT CONTRAST CT ORBITS WITHOUT CONTRAST TECHNIQUE: Multidetector CT imaging of the head, orbits and maxillofacial structures were performed using the standard protocol without intravenous contrast. Multiplanar CT image reconstructions of the maxillofacial structures were also generated. COMPARISON:  Head CT scan 06/04/2015. FINDINGS: CT HEAD FINDINGS Brain: No evidence of acute infarction, hemorrhage, hydrocephalus, extra-axial collection or mass lesion/mass effect. Remote right frontal infarct is unchanged. Vascular: No hyperdense vessel or unexpected calcification. Skull: Intact.  No focal lesion. Other: None. CT MAXILLOFACIAL FINDINGS Osseous: The patient has an acute fracture of the inferior wall of the left orbit. The fracture is approximately 0.8 cm medial to the lateral wall of the left orbit with inferior displacement of 0.7 cm. No other facial bone fracture is identified. Mandibular condyles are located. Orbits: A small amount orbital emphysema on the left related of to the patient's fracture is identified. The globes are intact and lenses are located. Negative for extraocular muscle entrapment. Sinuses: Hemorrhage in the left maxillary sinus related to the patient's fracture is noted. Small mucous retention cyst or polyp in the right maxillary sinus is seen. Soft tissues: Soft tissue contusion about the left eye is identified. CT ORBITS FINDINGS Orbits: Fracture  of the inferior wall of the left orbit is seen as described above. Visualized sinuses: Hemorrhage in the left maxillary sinus is noted. Small mucous retention cyst or polyp right maxillary sinus is seen. Soft tissues: Soft tissue contusion is seen about the left eye. IMPRESSION: Depressed fracture of the inferior wall of the left orbit with associated hemorrhage in the left maxillary sinus. Negative for extraocular muscle entrapment. No other acute abnormality. Remote right frontal infarct. Electronically Signed   By: Inge Rise M.D.   On: 12/25/2018 10:13    ____________________________________________   PROCEDURES  Procedure(s) performed:   Procedures  None ____________________________________________   INITIAL IMPRESSION / ASSESSMENT AND PLAN / ED COURSE  Pertinent labs & imaging results that were available during my care of the patient were reviewed by me and considered in my medical decision making (see chart for details).  Patient presents to the emergency department with mild to moderate periorbital edema.  Extraocular movements intact without diplopia.  She does have swelling over the left forehead with a very superficial scrape.  No laceration requiring repair.  Plan for CT imaging of the head, face, orbits and will reassess afterwards.  CT and plain films reviewed. Discussed inferior orbital wall fracture. No entrapment either on scan or clinically. Gave nose blowing and sneeze precautions along with ENT f/u information/timeline and abx. Discussed ED follow up precautions.  ____________________________________________  FINAL CLINICAL IMPRESSION(S) / ED DIAGNOSES  Final diagnoses:  Closed fracture of orbital wall, initial encounter ([place])  Fall, initial encounter  Injury of head, initial encounter     MEDICATIONS GIVEN DURING THIS VISIT:  Medications  fluorescein ophthalmic strip 1 strip (1 strip Both Eyes Given 12/25/18 0910)  tetracaine (PONTOCAINE) 0.5 %  ophthalmic solution 2 drop (2 drops Both Eyes Given 12/25/18 0910)     NEW OUTPATIENT MEDICATIONS STARTED DURING THIS VISIT:  Discharge Medication List as of 12/25/2018 10:34 AM    START taking these medications   Details  cephALEXin (KEFLEX) 500 MG capsule Take 1 capsule (500 mg total) by mouth 3 (three) times daily for 7 days., Starting Mon 12/25/2018, Until Mon 01/01/2019, Normal    oxyCODONE-acetaminophen (PERCOCET/ROXICET) 5-325 MG tablet Take 1 tablet by mouth every 6 (six) hours as needed for severe pain., Starting Mon 12/25/2018, Normal        Note:  This document was prepared using Dragon voice recognition software and may include unintentional dictation errors.  Nanda Quinton, MD Emergency Medicine    Long, [facility], MD 12/25/18 2028

## 2018-12-25 NOTE — Discharge Instructions (Signed)
You were seen in the ED today with eye injury. You have a fracture at the bottom of the left eye.  You will need to follow-up with the ear nose and throat doctor listed on this form.  Call today to schedule an appointment in the coming week.  I have given a prescription for both antibiotic and pain medication.  Do not drive while taking the pain medication.  This may cause constipation which will require over-the-counter laxatives if you become constipated.   You need to sneeze with your mouth open and not blow your nose as this could cause problems with your fracture.  Apply ice intermittently for the afternoon.  Return to the department with any double vision, worsening blurry vision, fevers, headache, or additional injury.

## 2019-01-01 ENCOUNTER — Other Ambulatory Visit: Payer: Self-pay

## 2019-01-01 ENCOUNTER — Encounter: Payer: Self-pay | Admitting: Physical Therapy

## 2019-01-01 ENCOUNTER — Ambulatory Visit: Payer: Managed Care, Other (non HMO) | Attending: Gastroenterology | Admitting: Physical Therapy

## 2019-01-01 DIAGNOSIS — R279 Unspecified lack of coordination: Secondary | ICD-10-CM | POA: Diagnosis present

## 2019-01-01 DIAGNOSIS — M6281 Muscle weakness (generalized): Secondary | ICD-10-CM | POA: Insufficient documentation

## 2019-01-01 DIAGNOSIS — S0285XD Fracture of orbit, unspecified, subsequent encounter for fracture with routine healing: Secondary | ICD-10-CM | POA: Insufficient documentation

## 2019-01-01 NOTE — Therapy (Signed)
[facility] [address] [place], [place], [address] Phone: [phone]   Fax:  [phone]  Physical Therapy Evaluation  Patient Details  Name: Helen Perez MRN: [id number] Date of Birth: 31-Dec-1965 Referring Provider (PT): Dr. Alfred Levins   Encounter Date: 01/01/2019  PT End of Session - 01/01/19 1539    Visit Number  1    Date for PT Re-Evaluation  03/26/19    Authorization Type  [employer]    Authorization - Visit Number  1    Authorization - Number of Visits  90    PT Start Time  0100    PT Stop Time  1600    PT Time Calculation (min)  45 min    Activity Tolerance  Patient tolerated treatment well    Behavior During Therapy  [facility] for tasks assessed/performed       Past Medical History:  Diagnosis Date  . Anxiety   . Depression   . Diabetes mellitus without complication ([place])   . Sleep apnea    CPAP  . Stroke [facility])     Past Surgical History:  Procedure Laterality Date  . ABDOMINAL HYSTERECTOMY    . FOOT SURGERY Right     There were no vitals filed for this visit.   Subjective Assessment - 01/01/19 1510    Subjective  Lumbar pain started several months ago with sudden onset. Patient reports decreased control with the pelvic floor muscles. It affect her anus, back, and vaginally. Sometimes has to manually remove her stools. She will have fecal leakage at night when she is relaxing. Not able to stop the stools. Patient slipped and fell trying to get to the bathroom last Monday and does not know how it happened. Patient went to the Emergency room and has an eye fracture.     Patient Stated Goals  reduce leakage and build up muscle for the pelvic floor and decrease back pain; work on balance    Currently in Pain?  Yes    Pain Score  8     Pain Location  Back    Pain Orientation  Lower    Pain Descriptors / Indicators  Aching    Pain Type  Acute pain    Pain Onset  More than a month ago    Aggravating Factors   standing, sitting without support,     Pain Relieving Factors  lay down    Multiple Pain Sites  No         [facility] PT Assessment - 01/01/19 0001      Assessment   Medical Diagnosis  R15.9, R15.2 Incontinence of feces with fecal urgency; F1219 Pelvic floor dysfunction; X5883 constipation, outlet dysfunction    Referring Provider (PT)  Dr. Alfred Levins    Onset Date/Surgical Date  12/31/16    Prior Therapy  Yes      Precautions   Precautions  None      Restrictions   Weight Bearing Restrictions  No      Balance Screen   Has the patient fallen in the past 6 months  Yes    How many times?  1   while going to the bathroom at night   Has the patient had a decrease in activity level because of a fear of falling?   Yes    Is the patient reluctant to leave their home because of a fear of falling?   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    Leisure  None      Cognition   Overall Cognitive Status  Within Functional Limits for tasks assessed      Posture/Postural Control   Posture/Postural Control  Postural limitations    Postural Limitations  Rounded Shoulders;Forward head      ROM / Strength   AROM / PROM / Strength  AROM;PROM;Strength      AROM   Lumbar Flexion  full    Lumbar Extension  decreased by 50%    Lumbar - Right Side Bend  decreased by 25%    Lumbar - Left Side Bend  decreased by 25%      Palpation   Spinal mobility  T10-L5 decreased anterior posterior mobility    SI assessment   left ilium rotated posteriorly,     Palpation comment  tenderness located in the lower abdomen, tenderness located in the lumbar paraspinals      Standardized Balance Assessment   Standardized Balance Assessment  Berg Balance Test      Berg Balance Test   Sit to Stand  Able to stand  independently  using hands    Standing Unsupported  Able to stand safely 2 minutes    Sitting with Back Unsupported but Feet Supported on Floor or Stool  Able to sit safely and securely 2 minutes    Stand to Sit  Controls descent by using hands    Transfers  Able to transfer safely, definite need of hands    Standing Unsupported with Eyes Closed  Able to stand 10 seconds with supervision    Standing Unsupported with Feet Together  Able to place feet together independently and stand 1 minute safely    From Standing, Reach Forward with Outstretched Arm  Can reach forward >12 cm safely (5")    From Standing Position, Pick up Object from Floor  Able to pick up shoe, needs supervision    From Standing Position, Turn to Look Behind Over each Shoulder  Needs supervision when turning    Turn 360 Degrees  Able to turn 360 degrees safely in 4 seconds or less    Standing Unsupported, Alternately Place Feet on Step/Stool  Able to stand independently and safely and complete 8 steps in 20 seconds    Standing Unsupported, One Foot in Front  Able to take small step independently and hold 30 seconds    Standing on One Leg  Tries to lift leg/unable to hold 3 seconds but remains standing independently    Total Score  42    Berg comment:  80% chance of falling                Objective measurements completed on examination: See above findings.    Pelvic Floor Special Questions - 01/01/19 0001    Prior Pregnancies  Yes    Number of Pregnancies  1    Number of Vaginal Deliveries  1    Any difficulty with labor and deliveries  Yes   trouble getting the baby out   Episiotomy Performed  Yes    Currently Sexually Active  No    Urinary Leakage  Yes    Pad use  3 diapers at night; sometimes wears a diaper during the day    Activities that cause leaking  Coughing;Sneezing    Fecal incontinence  Yes   belching prior to leakage   Skin Integrity  Intact    Perineal Body/Introitus   Elevated   tight   Pelvic Floor  Internal Exam  Patient confirms identication and approves PT to assess pelvic floor and treatment    Exam Type  Vaginal;Rectal   vaginal 3 for 5 sec   Palpation  tenderness located on bil. uretra sphincter, both sides of bladder, posterior by levator ani/rectal area    Strength  weak squeeze, no lift   rectum for 5 sec                PT Short Term Goals - 01/01/19 1606      PT SHORT TERM GOAL #1   Title  independent with initial HEP    Time  4    Period  Weeks    Status  New    Target Date  01/29/19      PT SHORT TERM GOAL #2   Title  understand abdominal massage to move stool through to reduce her belching    Time  4    Period  Weeks    Target Date  01/29/19      PT SHORT TERM GOAL #3   Title  urinary leakage with sneezing and laughing decreased by 25% due to increased strength    Time  4    Period  Weeks    Status  New    Target Date  01/29/19      PT SHORT TERM GOAL #4   Title  fecal leakage during the night decreased >/= 25% due to increased strength of anal sphincter    Time  4    Period  Weeks    Status  New    Target Date  01/29/19        PT Long Term Goals - 01/01/19 1603      PT LONG TERM GOAL #1   Title  independent with HEP and how to progress her HEp    Time  12    Period  Weeks    Status  New    Target Date  03/26/19      PT LONG TERM GOAL #2   Title  Berg balance score increased >/= 52/56 to reduce her chance of falling as she is walking to the bathroom at night    Time  12    Period  Weeks    Status  New    Target Date  03/26/19      PT LONG TERM GOAL #3   Title  fecal leakage decreased >/= 75% so she is not having to wear depends just a light day    Time  12    Period  Weeks    Status  On-going    Target Date  03/26/19      PT LONG TERM GOAL #4   Title  pelvic floor strength >/= 3/5 to reduce urinary leakage with sneezing and coughing    Time  12    Period  Weeks    Status  New    Target Date  03/26/19      PT LONG TERM  GOAL #5   Title  back pain with standing decreased >/= 75% due to improved mobility and strength    Time  12    Period  Weeks    Status  New    Target Date  03/26/19  Plan - 01/01/19 1612    Clinical Impression Statement  Patient is a 53 year old female with fecal leakage, urinary leakage, back pain and difficulty with her balance. Patient fell 1 week ago while she was walking to the commode at night and not sure why she fell. Patient is nervous about falling again. Berg balance score was 42/56. Patient reports her balance was off in 10/2018 and not sure why. Patient reports her back pain is 8/10 with standing and sitting without back support. Patient reports her back pain started several months ago with sudden onset. Patient reports urinary leakage with coughing and sneezing. Fecal leakage is during the night. She will wear 1 depends during the day and 3 at night. Anal sphincter strength is 2/5 holding for 5 seconds and needed tactile cues to puss the puborectalis forward. Vaginal sphincter strength is 2/5 holding for 5 sec. Patient has decreased mobility of L1-L5. Patient has tenderness located in  lumbar paraspinals, lower abdominal, bilateral sides of bladder and urethra spincter, levator ani. Patient will benefit from skilled therapy to improve pelvic floor coordination and strength to reduce leakage, improve balance, and reduce back pain.     Personal Factors and Comorbidities  Age;Comorbidity 3+    Comorbidities  abdominal hysterectomy, diabetes, stroke, depression, recent fall    Examination-Activity Limitations  Locomotion Level;Continence;Other    Stability/Clinical Decision Making  Evolving/Moderate complexity    Clinical Decision Making  Moderate    Rehab Potential  Excellent    PT Frequency  1x / week    PT Duration  12 weeks    PT Treatment/Interventions  Biofeedback;Therapeutic activities;Therapeutic exercise;Balance training;Neuromuscular re-education;Manual  techniques;Patient/family education;Scar mobilization;Passive range of motion;Moist Heat;Cryotherapy;Electrical Stimulation;Iontophoresis 4mg /ml Dexamethasone;Functional mobility training;Joint Manipulations    PT Next Visit Plan  soft tissue work to back, hip stretches, balance exercises, spinal stretches, pelvic floor exericise for vaginal and rectal    Consulted and Agree with Plan of Care  Patient       Patient will benefit from skilled therapeutic intervention in order to improve the following deficits and impairments:  Increased fascial restricitons, Pain, Decreased coordination, Decreased mobility, Decreased scar mobility, Increased muscle spasms, Decreased endurance, Decreased activity tolerance, Decreased range of motion, Decreased strength, Decreased balance  Visit Diagnosis: Muscle weakness (generalized) - Plan: PT plan of care cert/re-cert  Unspecified lack of coordination - Plan: PT plan of care cert/re-cert     Problem List There are no active problems to display for this patient.   Earlie Counts, PT 01/01/19 5:05 PM   [facility] [address] [place], [place], [address] Phone: [phone]   Fax:  [phone]  Name: Helen Perez MRN: [id number] Date of Birth: June 17, 1966

## 2019-01-01 NOTE — Patient Instructions (Addendum)
Quick Contraction: Gravity Eliminated (Side-Lying)    Lie on left side, hips and knees slightly bent. Quickly squeeze then fully relax pelvic floor. Perform __1_ sets of _5__. Rest for _1__ seconds between sets. Do __4_ times a day.   Copyright  VHI. All rights reserved.  Slow Contraction: Gravity Eliminated (Side-Lying)    Lie on left side, hips and knees slightly bent. Slowly squeeze pelvic floor for _5__ seconds. Rest for _5__ seconds. Repeat 10___ times. Do _4__ times a day.   Copyright  VHI. All rights reserved.  [facility] [address], [address] [place], [place] [address] Phone # [phone] Fax [phone]  

## 2019-01-10 ENCOUNTER — Ambulatory Visit: Payer: Managed Care, Other (non HMO) | Admitting: Physical Therapy

## 2019-01-17 ENCOUNTER — Encounter: Payer: Managed Care, Other (non HMO) | Admitting: Physical Therapy

## 2019-01-31 ENCOUNTER — Encounter: Payer: Managed Care, Other (non HMO) | Admitting: Physical Therapy

## 2019-02-07 ENCOUNTER — Encounter: Payer: Managed Care, Other (non HMO) | Admitting: Physical Therapy

## 2019-02-14 ENCOUNTER — Encounter: Payer: Managed Care, Other (non HMO) | Admitting: Physical Therapy

## 2019-02-21 ENCOUNTER — Encounter: Payer: Managed Care, Other (non HMO) | Admitting: Physical Therapy

## 2019-06-18 DIAGNOSIS — N898 Other specified noninflammatory disorders of vagina: Secondary | ICD-10-CM | POA: Insufficient documentation

## 2020-01-08 ENCOUNTER — Ambulatory Visit: Payer: Managed Care, Other (non HMO) | Admitting: Medical-Surgical

## 2020-01-08 ENCOUNTER — Encounter: Payer: Self-pay | Admitting: Medical-Surgical

## 2020-01-08 ENCOUNTER — Other Ambulatory Visit: Payer: Self-pay

## 2020-01-08 VITALS — BP 147/91 | HR 103 | Temp 97.8°F | Ht 66.5 in | Wt 218.0 lb

## 2020-01-08 DIAGNOSIS — E1165 Type 2 diabetes mellitus with hyperglycemia: Secondary | ICD-10-CM | POA: Diagnosis not present

## 2020-01-08 DIAGNOSIS — K5900 Constipation, unspecified: Secondary | ICD-10-CM | POA: Diagnosis not present

## 2020-01-08 DIAGNOSIS — N289 Disorder of kidney and ureter, unspecified: Secondary | ICD-10-CM

## 2020-01-08 DIAGNOSIS — Z7689 Persons encountering health services in other specified circumstances: Secondary | ICD-10-CM

## 2020-01-08 DIAGNOSIS — D638 Anemia in other chronic diseases classified elsewhere: Secondary | ICD-10-CM | POA: Diagnosis not present

## 2020-01-08 DIAGNOSIS — Z23 Encounter for immunization: Secondary | ICD-10-CM

## 2020-01-08 DIAGNOSIS — Z794 Long term (current) use of insulin: Secondary | ICD-10-CM

## 2020-01-08 DIAGNOSIS — E1169 Type 2 diabetes mellitus with other specified complication: Secondary | ICD-10-CM

## 2020-01-08 DIAGNOSIS — E785 Hyperlipidemia, unspecified: Secondary | ICD-10-CM

## 2020-01-08 MED ORDER — METOCLOPRAMIDE HCL 10 MG PO TABS: 5.0000 mg | ORAL_TABLET | Freq: Three times a day (TID) | ORAL | 3 refills | Status: DC | PRN

## 2020-01-08 MED ORDER — TRULANCE 3 MG PO TABS: 3.0000 mg | ORAL_TABLET | Freq: Every day | ORAL | 2 refills | Status: DC

## 2020-01-08 NOTE — Progress Notes (Signed)
New Patient Office Visit  Subjective:  Patient ID: Helen Perez, female    DOB: 1965/12/02  Age: 54 y.o. MRN: [id number]  CC:  Chief Complaint  Patient presents with  . Establish Care  . Constipation  . Gastroesophageal Reflux  . Weight Gain    HPI Helen Perez presents to establish care.  Switching care from [facility] with [place] [place].  Chronic constipation-reports having issues with chronic constipation, bloating, increased flatulence, increased reflux.  Poor appetite, nausea and sometimes vomiting if she forces herself to eat.  Was followed by GI for a while but has not seen them since last year.  Has tried Dulcolax, suppositories, fleets enema, Colace, MiraLAX, Linzess, and mag citrate.  Currently taking Culturelle.  Reports stools are hard, and small balls, sometimes requiring digital manipulation to remove.  Bowel movements average once weekly.  Colonoscopy 5 years ago, normal per patient report.  Has increased her water intake.  Feels that she is gaining weight because she is unable to have adequate bowel movements.  Diabetes mellitus type 2-uses continuous glucose monitor with insulin pump.  Managed by endocrinology.  Last hemoglobin A1c in 11/2019 was 10.1.  Hyperlipidemia-taking atorvastatin 40 mg daily.  Tolerating medication well without side effects.  Poor dietary intake with GI issues recently.  Anemia of chronic disease-last hemoglobin 12.4 in 09/2019.  No lightheadedness, excessive fatigue, shortness of breath.  Past Medical History:  Diagnosis Date  . Anxiety   . Depression   . Diabetes mellitus without complication ([place])   . H/O syphilis   . HSV-2 infection   . Hypertension associated with diabetes ([place]) 03/30/2018  . Renal insufficiency 10/13/2018  . Sleep apnea    CPAP  . Stroke [facility])     Past Surgical History:  Procedure Laterality Date  . ABDOMINAL HYSTERECTOMY    . FOOT SURGERY Right     Family History  Problem  Relation Age of Onset  . Diabetes Mother   . Heart disease Mother   . Heart failure Mother   . Diabetes Sister   . Kidney failure Sister   . Other Brother        quadraplegia  . Diabetes Maternal Grandmother   . Diabetes Brother   . Kidney failure Brother   . Diabetes Brother   . Kidney failure Father   . Colon cancer Neg Hx   . Esophageal cancer Neg Hx   . Pancreatic cancer Neg Hx   . Stomach cancer Neg Hx   . Liver disease Neg Hx     Social History   Socioeconomic History  . Marital status: Single    Spouse name: Not on file  . Number of children: 1  . Years of education: Not on file  . Highest education level: Not on file  Occupational History  . Not on file  Tobacco Use  . Smoking status: Former Smoker    Types: Cigarettes    Quit date: 07/27/2012    Years since quitting: 7.4  . Smokeless tobacco: Never Used  Substance and Sexual Activity  . Alcohol use: Yes    Comment: 2-3 drinks/month, wine liquor  . Drug use: No  . Sexual activity: Yes    Partners: Male    Birth control/protection: Surgical  Other Topics Concern  . Not on file  Social History Narrative  . Not on file   Social Determinants of Health   Financial Resource Strain:   . Difficulty of Paying Living Expenses:  Food Insecurity:   . Worried About Charity fundraiser in the Last Year:   . Arboriculturist in the Last Year:   Transportation Needs:   . Film/video editor (Medical):   Marland Kitchen Lack of Transportation (Non-Medical):   Physical Activity:   . Days of Exercise per Week:   . Minutes of Exercise per Session:   Stress:   . Feeling of Stress :   Social Connections:   . Frequency of Communication with Friends and Family:   . Frequency of Social Gatherings with Friends and Family:   . Attends Religious Services:   . Active Member of Clubs or Organizations:   . Attends Archivist Meetings:   Marland Kitchen Marital Status:   Intimate Partner Violence:   . Fear of Current or Ex-Partner:    . Emotionally Abused:   Marland Kitchen Physically Abused:   . Sexually Abused:     ROS Review of Systems  Constitutional: Negative for chills, fatigue, fever and unexpected weight change.  Respiratory: Negative for cough, chest tightness, shortness of breath and wheezing.   Cardiovascular: Negative for chest pain, palpitations and leg swelling.  Gastrointestinal: Positive for abdominal distention, abdominal pain, constipation, nausea and vomiting. Negative for blood in stool and diarrhea.  Genitourinary: Negative for dysuria, frequency, hematuria and urgency.  Neurological: Negative for dizziness, light-headedness, numbness and headaches.  Psychiatric/Behavioral: Negative for self-injury, sleep disturbance and suicidal ideas. The patient is not nervous/anxious.     Objective:   Today's Vitals: BP (!) 147/91   Pulse (!) 103   Temp 97.8 F (36.6 C) (Oral)   Ht 5' 6.5" (1.689 m)   Wt 218 lb (98.9 kg)   SpO2 96%   BMI 34.66 kg/m   Physical Exam Vitals reviewed.  Constitutional:      General: She is not in acute distress.    Appearance: Normal appearance.  HENT:     Head: Normocephalic and atraumatic.  Cardiovascular:     Rate and Rhythm: Normal rate and regular rhythm.     Pulses: Normal pulses.     Heart sounds: Normal heart sounds. No murmur. No friction rub. No gallop.   Pulmonary:     Effort: Pulmonary effort is normal. No respiratory distress.     Breath sounds: Normal breath sounds. No wheezing.  Abdominal:     General: Bowel sounds are increased. There is distension.     Palpations: Abdomen is soft. There is no mass.     Tenderness: There is abdominal tenderness. There is no guarding or rebound.     Hernia: No hernia is present.  Skin:    General: Skin is warm and dry.  Neurological:     Mental Status: She is alert and oriented to person, place, and time.  Psychiatric:        Mood and Affect: Mood normal.        Behavior: Behavior normal.        Thought Content: Thought  content normal.        Judgment: Judgment normal.     Assessment & Plan:   1. Hyperlipidemia associated with type 2 diabetes mellitus (HCC) Checking lipids today. Continue atorvastatin 40mg  daily.  - Lipid panel  2. Anemia, chronic disease Checking CBC today. - CBC  3. Encounter to establish care Due for shingles vaccine, second Covid vaccine, mammogram, and diabetic foot exam.  Advised to wait at least 4 weeks after second Covid vaccine before obtaining shingles vaccine.  Will address mammogram  and other health prevention needs at next appointment.  4. Need for Tdap vaccination Tdap vaccine given today. - Tdap vaccine greater than or equal to 7yo IM  5. Constipation, unspecified constipation type Starting Trulance 3 mg daily and metoclopramide 5 mg 3 times daily as needed.  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  Referring to GI to establish relationship for further management and evaluation that is within network for her insurance. - Plecanatide (TRULANCE) 3 MG TABS; Take 3 mg by mouth daily.  Dispense: 30 tablet; Refill: 2 - metoCLOPramide (REGLAN) 10 MG tablet; Take 0.5 tablets (5 mg total) by mouth 3 (three) times daily as needed for nausea (nausea/reflux).  Dispense: 30 tablet; Refill: 3 - Ambulatory referral to Gastroenterology  6. Renal insufficiency Checking CMP. - COMPLETE METABOLIC PANEL WITH GFR  7. Type 2 diabetes mellitus with hyperglycemia, with long-term current use of insulin ([place]) Managed by endocrinology.  New referral placed to endocrinology to establish relationship that is in network with her new insurance. - Ambulatory referral to Endocrinology   Outpatient Encounter Medications as of 01/08/2020  Medication Sig  . acetaminophen (TYLENOL) 650 MG CR tablet Take 1 tablet by mouth as needed.  Marland Kitchen aspirin EC 81 MG tablet Take 1 tablet by mouth daily.  Marland Kitchen atorvastatin (LIPITOR) 40 MG tablet  Take 1 tablet by mouth daily.  . Continuous Blood Gluc Sensor (DEXCOM G6 SENSOR) MISC 1 Device. Every 10 days  . fexofenadine-pseudoephedrine (ALLEGRA-D 24) 180-240 MG 24 hr tablet Take 1 tablet by mouth daily as needed.  . furosemide (LASIX) 20 MG tablet Take 1 tablet by mouth daily.  . Glucagon (BAQSIMI ONE PACK) 3 MG/DOSE POWD Place 1 Dose into the nose daily as needed.  . Insulin Aspart (NOVOLOG [place]) Inject 20 Units into the skin 3 (three) times daily.   . Insulin Disposable Pump (OMNIPOD DASH 5 PACK PODS) MISC Inject 1 Device into the skin every 3 (three) days.  . Magnesium 400 MG TABS Take 1 tablet by mouth daily.  . MULTIPLE VITAMINS PO Take 1 tablet by mouth daily.  . pregabalin (LYRICA) 150 MG capsule Take 150 mg by mouth 2 (two) times daily.  . Probiotic Product (CULTURELLE PROBIOTICS PO) Take 200 mg by mouth daily.  . Semaglutide,0.25 or 0.5MG /DOS, (OZEMPIC, 0.25 OR 0.5 MG/DOSE,) 2 MG/1.5ML SOPN Inject 1 Dose into the skin once a week.  . topiramate (TOPAMAX) 25 MG tablet Take 25 mg by mouth daily before supper.  . valACYclovir (VALTREX) 500 MG tablet Take 1 tablet by mouth daily.   . metoCLOPramide (REGLAN) 10 MG tablet Take 0.5 tablets (5 mg total) by mouth 3 (three) times daily as needed for nausea (nausea/reflux).  . Plecanatide (TRULANCE) 3 MG TABS Take 3 mg by mouth daily.  . [DISCONTINUED] baclofen (LIORESAL) 10 MG tablet Take 1 tablet by mouth 3 (three) times daily.  . [DISCONTINUED] Insulin Glargine, 1 Unit Dial, (TOUJEO SOLOSTAR) 300 UNIT/ML SOPN Inject 70 Units into the skin daily.  . [DISCONTINUED] lisinopril (PRINIVIL,ZESTRIL) 5 MG tablet Take 5 mg by mouth daily.  . [DISCONTINUED] omeprazole (PRILOSEC) 20 MG capsule Take 1 capsule by mouth daily.  . [DISCONTINUED] oxyCODONE-acetaminophen (PERCOCET/ROXICET) 5-325 MG tablet Take 1 tablet by mouth every 6 (six) hours as needed for severe pain. (Patient not taking: Reported on 01/08/2020)   No facility-administered encounter  medications on file as of 01/08/2020.    Follow-up: Return in about 4 weeks (around 02/05/2020) for HTN, renal  insufficiency, mood follow up.   Clearnce Sorrel, DNP, APRN, FNP-BC [facility]

## 2020-01-09 ENCOUNTER — Encounter: Payer: Self-pay | Admitting: Medical-Surgical

## 2020-01-09 DIAGNOSIS — N1831 Chronic kidney disease, stage 3a: Secondary | ICD-10-CM | POA: Insufficient documentation

## 2020-01-09 NOTE — Addendum Note (Signed)
Addended bySamuel Bouche on: 01/09/2020 08:57 AM   Modules accepted: Orders

## 2020-01-11 LAB — COMPLETE METABOLIC PANEL WITH GFR
AG Ratio: 1 (calc) (ref 1.0–2.5)
ALT: 35 U/L — ABNORMAL HIGH (ref 6–29)
AST: 25 U/L (ref 10–35)
Albumin: 4 g/dL (ref 3.6–5.1)
Alkaline phosphatase (APISO): 118 U/L (ref 37–153)
BUN/Creatinine Ratio: 21 (calc) (ref 6–22)
BUN: 22 mg/dL (ref 7–25)
CO2: 26 mmol/L (ref 20–32)
Calcium: 9.7 mg/dL (ref 8.6–10.4)
Chloride: 99 mmol/L (ref 98–110)
Creat: 1.07 mg/dL — ABNORMAL HIGH (ref 0.50–1.05)
GFR, Est African American: 68 mL/min/{1.73_m2} (ref >=60)
GFR, Est Non African American: 59 mL/min/{1.73_m2} — ABNORMAL LOW (ref >=60)
Globulin: 3.9 g/dL (calc) — ABNORMAL HIGH (ref 1.9–3.7)
Glucose, Bld: 275 mg/dL — ABNORMAL HIGH (ref 65–99)
Potassium: 4.2 mmol/L (ref 3.5–5.3)
Sodium: 133 mmol/L — ABNORMAL LOW (ref 135–146)
Total Bilirubin: 0.5 mg/dL (ref 0.2–1.2)
Total Protein: 7.9 g/dL (ref 6.1–8.1)

## 2020-01-11 LAB — CBC
HCT: 38.9 % (ref 35.0–45.0)
Hemoglobin: 11.9 g/dL (ref 11.7–15.5)
MCH: 25.6 pg — ABNORMAL LOW (ref 27.0–33.0)
MCHC: 30.6 g/dL — ABNORMAL LOW (ref 32.0–36.0)
MCV: 83.8 fL (ref 80.0–100.0)
MPV: 10.6 fL (ref 7.5–12.5)
Platelets: 325 10*3/uL (ref 140–400)
RBC: 4.64 10*6/uL (ref 3.80–5.10)
RDW: 12.8 % (ref 11.0–15.0)
WBC: 8.2 10*3/uL (ref 3.8–10.8)

## 2020-01-11 LAB — LIPID PANEL
Cholesterol: 134 mg/dL (ref <200)
HDL: 60 mg/dL (ref >=50)
LDL Cholesterol (Calc): 56 mg/dL (calc)
Non-HDL Cholesterol (Calc): 74 mg/dL (calc) (ref <130)
Total CHOL/HDL Ratio: 2.2 (calc) (ref <5.0)
Triglycerides: 96 mg/dL (ref <150)

## 2020-01-11 LAB — PATHOLOGIST SMEAR REVIEW

## 2020-01-23 ENCOUNTER — Telehealth: Payer: Self-pay | Admitting: Medical-Surgical

## 2020-01-23 DIAGNOSIS — B379 Candidiasis, unspecified: Secondary | ICD-10-CM

## 2020-01-23 MED ORDER — FLUCONAZOLE 150 MG PO TABS: 150.0000 mg | ORAL_TABLET | Freq: Once | ORAL | 0 refills | Status: AC

## 2020-01-23 NOTE — Telephone Encounter (Signed)
Patient called and reports that she is having white discharge and she is "sure its a yeast infection". She is wanting to have something sent in to the pharmacy for yeast. Please advise.

## 2020-01-23 NOTE — Telephone Encounter (Signed)
Diflucan 150mg  x 1 dose sent to pharmacy on file.

## 2020-01-23 NOTE — Telephone Encounter (Signed)
Patient is aware and did not have any questions.  

## 2020-01-29 NOTE — Progress Notes (Signed)
Name: Helen Perez  MRN/ DOB: [id number], February 09, 1966   Age/ Sex: 54 y.o., female    PCP: Samuel Bouche, NP   Reason for Endocrinology Evaluation: Type 2 Diabetes Mellitus     Date of Initial Endocrinology Visit: 01/30/2020     PATIENT IDENTIFIER: Helen Perez is a 54 y.o. female with a past medical history of HTN, TIA, OSA and T2DM. The patient presented for initial endocrinology clinic visit on 01/30/2020 for consultative assistance with her diabetes management.    HPI: Helen Perez was    Diagnosed with DM in 2000 Prior Medications tried/Intolerance: Metformin - No intolerance  Currently checking blood sugars frequently through the CGM  Hypoglycemia episodes :  no Hemoglobin A1c has ranged from 10.1% in 12/10/2019, peaking at 15.7% in 2016. Patient required assistance for hypoglycemia: no Patient has required hospitalization within the last 1 year from hyper or hypoglycemia: no  In terms of diet, the patient 2-3 meals, avoids sugar- sweetened beverages   Works 3 pm to 2:30 Am , goes to bed at 4:30 , wakes up at 10- 11 Am     HOME DIABETES REGIMEN: Novolog  Ozempic 1 mg weekly    This patient with type 2 diabetes is treated with Omnipod (insulin pump). During the visit the pump basal and bolus doses were reviewed including carb/insulin rations and supplemental doses. The clinical list was updated. The glucose meter download was reviewed in detail .  Pump and meter download:    Pump   Omnipod   Settings   Insulin type   NOVOLOG    Basal rate       0000-0400 1.3 u/h    0400-1800  1.4 u/h    1800-0000  1.3 u/h       I:C ratio       0000 8.0      AIT 0000 4 hrs           Sensitivity       0000  25       Goal       0000  120            Type & Model of Pump: Omnipod Insulin Type: Currently using Novolog.   PUMP STATISTICS: Average BG: 359 Average Daily Carbs (g): 0 Average Total Daily Insulin: 47.3 units Average Daily Basal: 3.3 (33%) Average  Daily Bolus: 6.8 (67%)       Statin: yes ACE-I/ARB:yes Prior Diabetic Education:Yes    CONTINUOUS GLUCOSE MONITORING RECORD INTERPRETATION    Dates of Recording: 4/1-4/14/2021  Sensor description:Dexcom  Results statistics:   CGM use % of time 14%  Average and SD 334/95  Time in range       13 %  % Time Above 180 87  % Time above 250 84  % Time Below target 0      Glycemic patterns summary: Hyperglycemia through the day and night   Hyperglycemic episodes  All day and night   Hypoglycemic episodes occurred none   Overnight periods: high       DIABETIC COMPLICATIONS: Microvascular complications:   Neuropathy  Denies: retinopathy,  CKD  Last eye exam: Completed 2021  Macrovascular complications:   CVA  Denies: CAD, PVD   PAST HISTORY: Past Medical History:  Past Medical History:  Diagnosis Date  . Anxiety   . Depression   . Diabetes mellitus without complication ([place])   . H/O syphilis   . HSV-2 infection   . Hypertension associated with diabetes (  [place]) 03/30/2018  . Renal insufficiency 10/13/2018  . Sleep apnea    CPAP  . Stroke [facility])    Past Surgical History:  Past Surgical History:  Procedure Laterality Date  . ABDOMINAL HYSTERECTOMY    . FOOT SURGERY Right       Social History:  reports that she quit smoking about 7 years ago. Her smoking use included cigarettes. She has never used smokeless tobacco. She reports current alcohol use. She reports that she does not use drugs. Family History:  Family History  Problem Relation Age of Onset  . Diabetes Mother   . Heart disease Mother   . Heart failure Mother   . Diabetes Sister   . Kidney failure Sister   . Other Brother        quadraplegia  . Diabetes Maternal Grandmother   . Diabetes Brother   . Kidney failure Brother   . Diabetes Brother   . Kidney failure Father   . Colon cancer Neg Hx   . Esophageal cancer Neg Hx   . Pancreatic cancer Neg Hx   . Stomach cancer Neg Hx    . Liver disease Neg Hx      HOME MEDICATIONS: Allergies as of 01/30/2020      Reactions   Latex Itching, Swelling   Other Dermatitis   FREESTYLE LIBRE SENSOR- cellulitis, wound on skin       Medication List       Accurate as of January 30, 2020  9:01 AM. If you have any questions, ask your nurse or doctor.        acetaminophen 650 MG CR tablet Commonly known as: TYLENOL Take 1 tablet by mouth as needed.   aspirin EC 81 MG tablet Take 1 tablet by mouth daily.   atorvastatin 40 MG tablet Commonly known as: LIPITOR Take 1 tablet by mouth daily.   Baqsimi One Pack 3 MG/DOSE Powd Generic drug: Glucagon Place 1 Dose into the nose daily as needed.   CULTURELLE PROBIOTICS PO Take 200 mg by mouth daily.   Dexcom G6 Sensor Misc 1 Device. Every 10 days   fexofenadine-pseudoephedrine 180-240 MG 24 hr tablet Commonly known as: ALLEGRA-D 24 Take 1 tablet by mouth daily as needed.   furosemide 20 MG tablet Commonly known as: LASIX Take 1 tablet by mouth daily.   Magnesium 400 MG Tabs Take 1 tablet by mouth daily.   metoCLOPramide 10 MG tablet Commonly known as: REGLAN Take 0.5 tablets (5 mg total) by mouth 3 (three) times daily as needed for nausea (nausea/reflux).   MULTIPLE VITAMINS PO Take 1 tablet by mouth daily.   NOVOLOG [place] Inject 20 Units into the skin 3 (three) times daily. Per pump   OmniPod Dash 5 Pack Pods Misc Inject 1 Device into the skin every 3 (three) days.   Ozempic (0.25 or 0.5 MG/DOSE) 2 MG/1.5ML Sopn Generic drug: Semaglutide(0.25 or 0.5MG /DOS) Inject 1 Dose into the skin once a week.   pregabalin 150 MG capsule Commonly known as: LYRICA Take 150 mg by mouth 2 (two) times daily.   topiramate 25 MG tablet Commonly known as: TOPAMAX Take 25 mg by mouth daily before supper.   Trulance 3 MG Tabs Generic drug: Plecanatide Take 3 mg by mouth daily.   valACYclovir 500 MG tablet Commonly known as: VALTREX Take 1 tablet by mouth daily.         ALLERGIES: Allergies  Allergen Reactions  . Latex Itching and Swelling  . Other Dermatitis  FREESTYLE LIBRE SENSOR- cellulitis, wound on skin      REVIEW OF SYSTEMS: A comprehensive ROS was conducted with the patient and is negative except as per HPI and below:  Review of Systems  Gastrointestinal: Positive for constipation.      OBJECTIVE:   VITAL SIGNS: BP 118/78 (BP Location: Left Arm, Patient Position: Sitting, Cuff Size: Large)   Pulse (!) 108   Temp 97.8 F (36.6 C)   Ht 5\' 7"  (1.702 m)   Wt 206 lb (93.4 kg)   SpO2 98%   BMI 32.26 kg/m    PHYSICAL EXAM:  General: Pt appears well and is in NAD  Lungs: Clear with good BS bilat with no rales, rhonchi, or wheezes  Heart: RRR with normal S1 and S2 and no gallops; no murmurs; no rub  Abdomen: Normoactive bowel sounds, soft, nontender, without masses or organomegaly palpable  Extremities:  Lower extremities - No pretibial edema. No lesions.  Neuro: MS is good with appropriate affect, pt is alert and Ox3    DM foot exam: 01/30/2020  The skin of the feet is intact without sores or ulcerations. The pedal pulses are 2+ on right and 2+ on left. The sensation is absent to a screening 5.07, 10 gram monofilament bilaterally   DATA REVIEWED:  Lab Results  Component Value Date   HGBA1C 12.5 (A) 01/30/2020   Lab Results  Component Value Date   LDLCALC 56 01/08/2020   CREATININE 1.07 (H) 01/08/2020   No results found for: [facility]  Lab Results  Component Value Date   CHOL 134 01/08/2020   HDL 60 01/08/2020   LDLCALC 56 01/08/2020   TRIG 96 01/08/2020   CHOLHDL 2.2 01/08/2020        ASSESSMENT / PLAN / RECOMMENDATIONS:   1) Type 2 Diabetes Mellitus, Poorly controlled, With Neuropathic and macrovascular  complications - Most recent A1c of 12.5 %. Goal A1c < 7.0 %.   Plan: GENERAL:  Poorly controlled diabetes due to medication nonadherence and dietary indiscretions  Pt has chronic history of  non-compliance, her A1c has been at 10.1% at best in the past 5 yrs. Today she tells me she has taken her Dexcom off for 2-3 weeks due to a rash but at the same time was not check with a regular glucose meter. She also tells me she took her insulin pump off for a few weeks due to rash but she was not using any injections in the meantime, nor has she entered any carbohydrates in the day and a half that she had it on. I am also not sure she know how to count carbs, when asked, pt stated she has it built in her pump Helen Perez, but when questioned about the carb content of coming small food items, she did not know the carb content.   I will consider sending her to our RD for further carb count education in the future.   We again emphasized the importance of entering carbs consumed through the pump including snacks.   No change will be made today due to lack of sufficient data   She was provided with a printed prescription of skin tac adhesive barrier   MEDICATIONS:  Continue Ozempic 1 mg weekly   Continue current pump settings  EDUCATION / INSTRUCTIONS:  BG monitoring instructions: Patient is instructed to check her blood sugars 4 times a day, before meals and bedtime.  Call [facility] [facility] if: BG persistently < 70 or > 300. Marland Kitchen I  reviewed the Rule of 15 for the treatment of hypoglycemia in detail with the patient. Literature supplied.   2) Diabetic complications:   Eye: Does not have known diabetic retinopathy.   Neuro/ Feet: Does  have known diabetic peripheral neuropathy.  Renal: Patient does not have known baseline CKD. She is on an ACEI/ARB at present.Check urine albumin/creatinine ratio yearly starting at time of diagnosis. If albuminuria is positive, treatment is geared toward better glucose, blood pressure control and use of ACE inhibitors or ARBs. Monitor electrolytes and creatinine once to twice yearly.   3) Lipids: LDL at 56 mg/DL which is at goal.  Continue current  dose of atorvastatin 40 mg daily   Follow-up in 3 months  Signed electronically by: Mack Guise, MD  [facility]  [facility] [address] [place], [place] [address] Phone: [phone] FAX: [phone]   CC: Samuel Bouche, [address] [place] [address] Phone: [phone]  Fax: [phone]    Return to Endocrinology clinic as below: Future Appointments  Date Time Provider [place]  02/05/2020  2:00 PM Samuel Bouche, NP [facility] None  02/13/2020  1:10 PM [place], Venia Minks, MD [facility]

## 2020-01-30 ENCOUNTER — Other Ambulatory Visit: Payer: Self-pay

## 2020-01-30 ENCOUNTER — Ambulatory Visit (INDEPENDENT_AMBULATORY_CARE_PROVIDER_SITE_OTHER): Payer: Managed Care, Other (non HMO) | Admitting: Internal Medicine

## 2020-01-30 ENCOUNTER — Encounter: Payer: Self-pay | Admitting: Internal Medicine

## 2020-01-30 VITALS — BP 118/78 | HR 108 | Temp 97.8°F | Ht 67.0 in | Wt 206.0 lb

## 2020-01-30 DIAGNOSIS — Z794 Long term (current) use of insulin: Secondary | ICD-10-CM

## 2020-01-30 DIAGNOSIS — E785 Hyperlipidemia, unspecified: Secondary | ICD-10-CM

## 2020-01-30 DIAGNOSIS — E1142 Type 2 diabetes mellitus with diabetic polyneuropathy: Secondary | ICD-10-CM

## 2020-01-30 DIAGNOSIS — E1165 Type 2 diabetes mellitus with hyperglycemia: Secondary | ICD-10-CM

## 2020-01-30 DIAGNOSIS — R739 Hyperglycemia, unspecified: Secondary | ICD-10-CM | POA: Diagnosis not present

## 2020-01-30 DIAGNOSIS — E1159 Type 2 diabetes mellitus with other circulatory complications: Secondary | ICD-10-CM | POA: Diagnosis not present

## 2020-01-30 LAB — POCT GLYCOSYLATED HEMOGLOBIN (HGB A1C): Hemoglobin A1C: 12.5 % — AB (ref 4.0–5.6)

## 2020-01-30 LAB — MICROALBUMIN / CREATININE URINE RATIO
Creatinine,U: 65.2 mg/dL
Microalb Creat Ratio: 59.1 mg/g — ABNORMAL HIGH (ref 0.0–30.0)
Microalb, Ur: 38.5 mg/dL — ABNORMAL HIGH (ref 0.0–1.9)

## 2020-01-30 MED ORDER — SKIN TAC ADHESIVE BARRIER WIPE MISC: 1.0000 | 6 refills | Status: AC

## 2020-01-30 NOTE — Patient Instructions (Signed)
-   No change to your pump settings today     Use the following resources for increased carb counting accuracy:  Foods with a Nutrition Facts label: Always check serving size and total carbohydrates.  Measure food as needed to determine exact carbs in the serving you are eating (food should always be measured as it is ready to eat - for example  1/2 cup oatmeal refers to 1/2 cup cooked not 1/2 cup uncooked).  Foods without a Nutrition Facts label:  Eating out - Try using the Go Meals app to determine carb content of fast food and sit down restaurant foods     HOW TO TREAT LOW BLOOD SUGARS (Blood sugar LESS THAN 70 MG/DL)  Please follow the RULE OF 15 for the treatment of hypoglycemia treatment (when your (blood sugars are less than 70 mg/dL)    STEP 1: Take 15 grams of carbohydrates when your blood sugar is low, which includes:   3-4 GLUCOSE TABS  OR  3-4 OZ OF JUICE OR REGULAR SODA OR  ONE TUBE OF GLUCOSE GEL     STEP 2: RECHECK blood sugar in 15 MINUTES STEP 3: If your blood sugar is still low at the 15 minute recheck --> then, go back to STEP 1 and treat AGAIN with another 15 grams of carbohydrates.

## 2020-01-31 DIAGNOSIS — E119 Type 2 diabetes mellitus without complications: Secondary | ICD-10-CM | POA: Insufficient documentation

## 2020-01-31 DIAGNOSIS — E785 Hyperlipidemia, unspecified: Secondary | ICD-10-CM | POA: Insufficient documentation

## 2020-01-31 DIAGNOSIS — E1142 Type 2 diabetes mellitus with diabetic polyneuropathy: Secondary | ICD-10-CM | POA: Insufficient documentation

## 2020-02-05 ENCOUNTER — Other Ambulatory Visit: Payer: Self-pay

## 2020-02-05 ENCOUNTER — Encounter: Payer: Self-pay | Admitting: Medical-Surgical

## 2020-02-05 ENCOUNTER — Ambulatory Visit (INDEPENDENT_AMBULATORY_CARE_PROVIDER_SITE_OTHER): Payer: Managed Care, Other (non HMO)

## 2020-02-05 ENCOUNTER — Ambulatory Visit (INDEPENDENT_AMBULATORY_CARE_PROVIDER_SITE_OTHER): Payer: Managed Care, Other (non HMO) | Admitting: Medical-Surgical

## 2020-02-05 VITALS — BP 112/75 | HR 115 | Temp 97.9°F | Ht 67.0 in | Wt 209.0 lb

## 2020-02-05 DIAGNOSIS — G4700 Insomnia, unspecified: Secondary | ICD-10-CM

## 2020-02-05 DIAGNOSIS — N289 Disorder of kidney and ureter, unspecified: Secondary | ICD-10-CM

## 2020-02-05 DIAGNOSIS — M7989 Other specified soft tissue disorders: Secondary | ICD-10-CM | POA: Diagnosis not present

## 2020-02-05 DIAGNOSIS — K5900 Constipation, unspecified: Secondary | ICD-10-CM | POA: Diagnosis not present

## 2020-02-05 MED ORDER — DOXEPIN HCL 3 MG PO TABS: 3.0000 mg | ORAL_TABLET | Freq: Every evening | ORAL | 0 refills | Status: DC | PRN

## 2020-02-05 NOTE — Progress Notes (Signed)
Subjective:    CC: Follow-up  HPI: Pleasant 54 year old female presenting today for follow-up on several issues.  Mood/sleep concerns-reports that her mood concerns of a month ago have improved some.  Continues to have concerns around sleep.  Able to fall asleep very easily but has difficulty staying asleep.  Has tried melatonin, Tylenol PM, over-the-counter sleep aids, trazodone.  Feels that Benadryl works opposite with her.  History of sleep apnea, has a CPAP at home that she does not use.  Constipation- Having daily bowel movements now that she has started Trulance and Reglan.  Reports having an episode of diarrhea this morning, no new foods or eating at restaurants in the last couple of days.  No other symptoms associated.  Left foot-reports twisting her foot 1 to 2 weeks ago.  Not specifically painful but does note that there is a lump on the top of her foot that did not used to be there.  When she removes her compression stockings, the foot swells very quickly and is uncomfortable to walk on.  Able to bear weight without difficulty.  Renal insufficiency-doing well overall.  Renal function labs show improvement in GFR and creatinine.  Bilateral lower extremity edema managed with furosemide 20 mg daily, compression stockings, and lower extremity elevation.  Staying well-hydrated.  No changes in voiding amount or pattern.  Blood pressure elevated at initial establish care appointment but has been normal at the endocrinologist appointment and again today.  I reviewed the past medical history, family history, social history, surgical history, and allergies today and no changes were needed.  Please see the problem list section below in epic for further details.  Past Medical History: Past Medical History:  Diagnosis Date  . Anxiety   . Depression   . Diabetes mellitus without complication ([place])   . H/O syphilis   . HSV-2 infection   . Hypertension associated with diabetes ([place]) 03/30/2018   . Renal insufficiency 10/13/2018  . Sleep apnea    CPAP  . Stroke [facility])    Past Surgical History: Past Surgical History:  Procedure Laterality Date  . ABDOMINAL HYSTERECTOMY    . FOOT SURGERY Right    Social History: Social History   Socioeconomic History  . Marital status: Single    Spouse name: Not on file  . Number of children: 1  . Years of education: Not on file  . Highest education level: Not on file  Occupational History  . Not on file  Tobacco Use  . Smoking status: Former Smoker    Types: Cigarettes    Quit date: 07/27/2012    Years since quitting: 7.5  . Smokeless tobacco: Never Used  Substance and Sexual Activity  . Alcohol use: Yes    Comment: 2-3 drinks/month, wine liquor  . Drug use: No  . Sexual activity: Yes    Partners: Male    Birth control/protection: Surgical  Other Topics Concern  . Not on file  Social History Narrative  . Not on file   Social Determinants of Health   Financial Resource Strain:   . Difficulty of Paying Living Expenses:   Food Insecurity:   . Worried About Charity fundraiser in the Last Year:   . Arboriculturist in the Last Year:   Transportation Needs:   . Film/video editor (Medical):   Marland Kitchen Lack of Transportation (Non-Medical):   Physical Activity:   . Days of Exercise per Week:   . Minutes of Exercise per Session:  Stress:   . Feeling of Stress :   Social Connections:   . Frequency of Communication with Friends and Family:   . Frequency of Social Gatherings with Friends and Family:   . Attends Religious Services:   . Active Member of Clubs or Organizations:   . Attends Archivist Meetings:   Marland Kitchen Marital Status:    Family History: Family History  Problem Relation Age of Onset  . Diabetes Mother   . Heart disease Mother   . Heart failure Mother   . Diabetes Sister   . Kidney failure Sister   . Other Brother        quadraplegia  . Diabetes Maternal Grandmother   . Diabetes Brother   . Kidney  failure Brother   . Diabetes Brother   . Kidney failure Father   . Colon cancer Neg Hx   . Esophageal cancer Neg Hx   . Pancreatic cancer Neg Hx   . Stomach cancer Neg Hx   . Liver disease Neg Hx    Allergies: Allergies  Allergen Reactions  . Latex Itching and Swelling  . Other Dermatitis    FREESTYLE LIBRE SENSOR- cellulitis, wound on skin    Medications: See med rec.  Review of Systems: No fevers, chills, night sweats, weight loss, chest pain, or shortness of breath.   Objective:    General: Well Developed, well nourished, and in no acute distress.  Neuro: Alert and oriented x3, extra-ocular muscles intact, sensation grossly intact.  HEENT: Normocephalic, atraumatic, pupils equal round reactive to light, neck supple, no masses, no lymphadenopathy, thyroid nonpalpable.  Skin: Warm and dry, no rashes. Cardiac: Regular rate and rhythm, no murmurs rubs or gallops, no lower extremity edema.  Respiratory: Clear to auscultation bilaterally. Not using accessory muscles, speaking in full sentences.   Impression and Recommendations:    1. Insomnia, unspecified type Doxepin 3 mg at bedtime for sleep as needed.  If 3 mg is not effective, may increase to 6 mg at bedtime.  If this medicine does not work for her, she will let me know and we will evaluate further options. - Doxepin HCl 3 MG TABS; Take 1-2 tablets (3-6 mg total) by mouth at bedtime as needed.  Dispense: 30 tablet; Refill: 0  2. Swelling of left foot Getting x-rays to start.  May benefit from evaluation by Dr. Darene Lamer. - DG Foot Complete Left; Future  3. Renal insufficiency Continue furosemide 20 mg daily.  Continue use of compression stockings and leg elevation when at rest.  Stay well-hydrated.  4. Constipation, unspecified constipation type Stable on Trulance and Reglan.  Given new onset diarrhea today with no other symptoms, suspect diarrhea is a result of medication. May consider holding 1-2 doses or taking half dose of  Trulance for a couple of days to allow for regulation of bowel movements.  Return in about 3 months (around 05/06/2020) for insomnia, GI concerns, renal insufficiency. ___________________________________________ Clearnce Sorrel, DNP, APRN, FNP-BC Primary Care and Sports Medicine [place]

## 2020-02-06 ENCOUNTER — Telehealth: Payer: Self-pay

## 2020-02-06 ENCOUNTER — Other Ambulatory Visit: Payer: Self-pay | Admitting: Medical-Surgical

## 2020-02-06 DIAGNOSIS — G4700 Insomnia, unspecified: Secondary | ICD-10-CM

## 2020-02-06 DIAGNOSIS — M7989 Other specified soft tissue disorders: Secondary | ICD-10-CM

## 2020-02-06 MED ORDER — DOXEPIN HCL 3 MG PO TABS: 3.0000 mg | ORAL_TABLET | Freq: Every evening | ORAL | 0 refills | Status: DC | PRN

## 2020-02-06 NOTE — Telephone Encounter (Signed)
Fax received from pharmacy regarding Rx for doxepin 3 mg, 1-2 tabs po qhs prn, disp #30. Pharmacist states her insurance will only cover a max of 1 tab/day, and that a PA will need to be done if you want her to get 2 tabs/day.

## 2020-02-06 NOTE — Telephone Encounter (Signed)
Lets just keep the dose at 3mg  nightly for now. If this in ineffective, please tell the patient to contact the office and we will make adjustments if needed.  Thanks, Caryl Asp

## 2020-02-06 NOTE — Telephone Encounter (Signed)
Pt aware of Rx adjustment and instructions. Pharmacy aware and verified they had already received the new e-script.

## 2020-02-11 ENCOUNTER — Telehealth: Payer: Self-pay

## 2020-02-11 NOTE — Telephone Encounter (Signed)
Pt called wanting to know the status of the MRI of her left foot that was ordered on 02/06/2020. I informed her that we just got the approval this morning, that the insurance company stated they had talked to her as well, and that there was a request for it to be done at [place] [place] due to cost. I told her that the order and PA info was faxed to [facility] this morning and we received a fax confirmation. Pt requested the phone number so she could call and schedule the imaging because her pain is getting worse. Phone number to [facility] in [place] provided.

## 2020-02-13 ENCOUNTER — Ambulatory Visit (INDEPENDENT_AMBULATORY_CARE_PROVIDER_SITE_OTHER): Payer: Managed Care, Other (non HMO) | Admitting: Gastroenterology

## 2020-02-13 ENCOUNTER — Encounter: Payer: Self-pay | Admitting: Gastroenterology

## 2020-02-13 VITALS — BP 114/62 | HR 102 | Temp 97.5°F | Ht 67.0 in | Wt 217.2 lb

## 2020-02-13 DIAGNOSIS — K5902 Outlet dysfunction constipation: Secondary | ICD-10-CM | POA: Diagnosis not present

## 2020-02-13 DIAGNOSIS — K5904 Chronic idiopathic constipation: Secondary | ICD-10-CM | POA: Diagnosis not present

## 2020-02-13 MED ORDER — SUTAB 1479-225-188 MG PO TABS: 24.0000 | ORAL_TABLET | ORAL | 0 refills | Status: DC

## 2020-02-13 MED ORDER — TRULANCE 3 MG PO TABS: 3.0000 mg | ORAL_TABLET | Freq: Every day | ORAL | 4 refills | Status: DC

## 2020-02-13 NOTE — Progress Notes (Signed)
Helen Perez    [id number]    07/06/1966  Primary Care Physician:Jessup, Caryl Asp, NP  Referring Physician: Samuel Bouche, NP [address] [place],  [place] [address]   Chief complaint:  Constipation  HPI:  54 year old female here for follow up visit. Last seen in office Feb, 2020.   She is taking Trulance daily  2 BM per week, still has to stimulate rectum digitally to initiate bowel movement on average once a week  Pelvic floor PT didn't help, "waste of my time"  No longer having fecal seepage and is not having to wear depends.   Worried about abdominal wall protrusion," feels her stomach is big, esp in the upper part. She wants to someone to put a tube and suction out stool from up there"  She has had extensive GI work at [facility], [facility] and [facility] before she established her care here in October 2019.  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 Small rectocele otherwise no other abnormality.  Sitz marker study negative for delayed colonic transit  Anorectal Manometry Oct 25, 2017 Weak internal and external sphincter RAIR is present Abnormal rectal sensation with elevated volume for first sensation ?not high resolution manometry , I am not familiar with interpretation. ?strain maneuver reveals a decrease in pelvic floor.   ColonoscopyMay 4, 2018:Normal. Recall colonoscopy in 10 years  EGD3/9/18 Mild chronic gastritis. Negative for H. Pylori Distal esophageal biopsies showed inflamed gastric type mucosa with no Barrett's esophagus  CT abdomen and pelvis August 2018 showed fatty liver otherwise unremarkable. Shealso had abdominal ultrasound with elastography did not show increased fibrosis  Outpatient Encounter Medications as of 02/13/2020  Medication Sig  . acetaminophen (TYLENOL) 650 MG CR tablet Take 1 tablet by mouth as needed.  Marland Kitchen aspirin EC 81 MG tablet Take 1 tablet by  mouth daily.  Marland Kitchen atorvastatin (LIPITOR) 40 MG tablet Take 1 tablet by mouth daily.  . Continuous Blood Gluc Sensor (DEXCOM G6 SENSOR) MISC 1 Device. Every 10 days  . Doxepin HCl 3 MG TABS Take 1 tablet (3 mg total) by mouth at bedtime as needed.  . fexofenadine-pseudoephedrine (ALLEGRA-D 24) 180-240 MG 24 hr tablet Take 1 tablet by mouth daily as needed.  . furosemide (LASIX) 20 MG tablet Take 1 tablet by mouth daily.  . Glucagon (BAQSIMI ONE PACK) 3 MG/DOSE POWD Place 1 Dose into the nose daily as needed.  . Insulin Aspart (NOVOLOG ) Inject 20 Units into the skin 3 (three) times daily. Per pump  . Insulin Disposable Pump (OMNIPOD DASH 5 PACK PODS) MISC Inject 1 Device into the skin every 3 (three) days.  . Magnesium 400 MG TABS Take 1 tablet by mouth daily.  . metoCLOPramide (REGLAN) 10 MG tablet Take 0.5 tablets (5 mg total) by mouth 3 (three) times daily as needed for nausea (nausea/reflux).  . MULTIPLE VITAMINS PO Take 1 tablet by mouth daily.  . Ostomy Supplies (SKIN TAC ADHESIVE BARRIER WIPE) MISC 1 applicator by Does not apply route as directed.  Marland Kitchen Plecanatide (TRULANCE) 3 MG TABS Take 3 mg by mouth daily.  . pregabalin (LYRICA) 150 MG capsule Take 150 mg by mouth 2 (two) times daily.  . Probiotic Product (CULTURELLE PROBIOTICS PO) Take 200 mg by mouth daily.  . Semaglutide, 1 MG/DOSE, (OZEMPIC, 1 MG/DOSE,) 4 MG/3ML SOPN Inject 1 mg into the skin once a week.  . topiramate (TOPAMAX) 25 MG tablet  Take 25 mg by mouth daily before supper.  . valACYclovir (VALTREX) 500 MG tablet Take 1 tablet by mouth daily.    No facility-administered encounter medications on file as of 02/13/2020.    Allergies as of 02/13/2020 - Review Complete 02/13/2020  Allergen Reaction Noted  . Latex Itching and Swelling 06/09/2015  . Other Dermatitis 04/13/2019    Past Medical History:  Diagnosis Date  . Anxiety   . Depression   . Diabetes mellitus without complication ([place])   . H/O syphilis   . HSV-2  infection   . Hypertension associated with diabetes ([place]) 03/30/2018  . Renal insufficiency 10/13/2018  . Sleep apnea    CPAP  . Stroke [facility])     Past Surgical History:  Procedure Laterality Date  . ABDOMINAL HYSTERECTOMY    . FOOT SURGERY Right     Family History  Problem Relation Age of Onset  . Diabetes Mother   . Heart disease Mother   . Heart failure Mother   . Diabetes Sister   . Kidney failure Sister   . Other Brother        quadraplegia  . Diabetes Maternal Grandmother   . Diabetes Brother   . Kidney failure Brother   . Diabetes Brother   . Kidney failure Father   . Colon cancer Neg Hx   . Esophageal cancer Neg Hx   . Pancreatic cancer Neg Hx   . Stomach cancer Neg Hx   . Liver disease Neg Hx     Social History   Socioeconomic History  . Marital status: Single    Spouse name: Not on file  . Number of children: 1  . Years of education: Not on file  . Highest education level: Not on file  Occupational History  . Not on file  Tobacco Use  . Smoking status: Former Smoker    Types: Cigarettes    Quit date: 07/27/2012    Years since quitting: 7.5  . Smokeless tobacco: Never Used  Substance and Sexual Activity  . Alcohol use: Yes    Comment: 2-3 drinks/month, wine liquor  . Drug use: No  . Sexual activity: Yes    Partners: Male    Birth control/protection: Surgical  Other Topics Concern  . Not on file  Social History Narrative  . Not on file   Social Determinants of Health   Financial Resource Strain:   . Difficulty of Paying Living Expenses:   Food Insecurity:   . Worried About Charity fundraiser in the Last Year:   . Arboriculturist in the Last Year:   Transportation Needs:   . Film/video editor (Medical):   Marland Kitchen Lack of Transportation (Non-Medical):   Physical Activity:   . Days of Exercise per Week:   . Minutes of Exercise per Session:   Stress:   . Feeling of Stress :   Social Connections:   . Frequency of Communication with  Friends and Family:   . Frequency of Social Gatherings with Friends and Family:   . Attends Religious Services:   . Active Member of Clubs or Organizations:   . Attends Archivist Meetings:   Marland Kitchen Marital Status:   Intimate Partner Violence:   . Fear of Current or Ex-Partner:   . Emotionally Abused:   Marland Kitchen Physically Abused:   . Sexually Abused:       Review of systems: All other review of systems negative except as mentioned in the HPI.  Physical Exam: Vitals:   02/13/20 1306  BP: 114/62  Pulse: (!) 102  Temp: (!) 97.5 F (36.4 C)   Body mass index is 34.02 kg/m. Gen:      No acute distress Abd:      soft, non-tender; no palpable masses, no distension Neuro: alert and oriented x 3 Psych: normal mood and affect  Data Reviewed:  Reviewed labs, radiology imaging, old records and pertinent past GI work up   Assessment and Plan/Recommendations:  74 yr F with obesity, DM, chronic constipation with dyssynergic defecation  Continue Trulance  If has worsening constipation symptoms due to incomplete defecation ok to do intermittent bowel purge as needed.  Will send Rx for Sutab  Patient didn't feel pelvic floor physical therapy was helpful, will hold off referral for now  Return as needed   The patient was provided an opportunity to ask questions and all were answered. The patient agreed with the plan and demonstrated an understanding of the instructions.  Damaris Hippo , MD    CC: Samuel Bouche, NP

## 2020-02-13 NOTE — Patient Instructions (Signed)
If you are age 55 or older, your body mass index should be between 23-30. Your Body mass index is 34.02 kg/m. If this is out of the aforementioned range listed, please consider follow up with your Primary Care Provider.  If you are age 71 or younger, your body mass index should be between 19-25. Your Body mass index is 34.02 kg/m. If this is out of the aformentioned range listed, please consider follow up with your Primary Care Provider.    Dr.Nandigam recommends that you complete a bowel purge (to clean out your bowels). Please do the following:See instructions on box of Sutab- rx sent to pharmacy. You should expect results within 1 to 6 hours after completing the bowel purge.  We have sent the following medications to your pharmacy for you to pick up at your convenience: Trulance and Sutab   Follow-up as needed.   Thank you for choosing me and [facility].  Dr. Silverio Decamp

## 2020-02-14 ENCOUNTER — Telehealth: Payer: Self-pay | Admitting: Gastroenterology

## 2020-02-14 NOTE — Telephone Encounter (Addendum)
Pt called regarding Sutab being to expensive. I've tried multi times to reach pharmacy regarding cost. I have been placed on hold for long periods of time for that reason I have placed a sample of Sutab at the front desk for pt to pick up. Pt informed and will come by office today and pick up.

## 2020-02-15 ENCOUNTER — Encounter: Payer: Self-pay | Admitting: Gastroenterology

## 2020-02-15 ENCOUNTER — Encounter: Payer: Self-pay | Admitting: Sports Medicine

## 2020-02-15 ENCOUNTER — Other Ambulatory Visit: Payer: Self-pay

## 2020-02-15 ENCOUNTER — Ambulatory Visit (INDEPENDENT_AMBULATORY_CARE_PROVIDER_SITE_OTHER): Payer: Managed Care, Other (non HMO) | Admitting: Sports Medicine

## 2020-02-15 DIAGNOSIS — S93602A Unspecified sprain of left foot, initial encounter: Secondary | ICD-10-CM

## 2020-02-15 DIAGNOSIS — S93622S Sprain of tarsometatarsal ligament of left foot, sequela: Secondary | ICD-10-CM | POA: Insufficient documentation

## 2020-02-15 MED ORDER — HYDROCODONE-ACETAMINOPHEN 5-325 MG PO TABS: 1.0000 | ORAL_TABLET | Freq: Three times a day (TID) | ORAL | 0 refills | Status: DC | PRN

## 2020-02-15 NOTE — Assessment & Plan Note (Signed)
Several days ago this pleasant 54 year old female stood up and rolled her left foot inward. She had immediate pain, swelling, bruising. She was seen by Samuel Bouche, NP, x-rays were ordered that showed a possible Lisfranc injury, MRI was appropriately ordered as a follow-up. There are no fractures on the MRI, there is some edema in the midfoot bones and ligaments, but the Lisfranc ligament looks intact and not widened. We are going to transition into a cam boot, hydrocodone for pain, elevation and icing, rehab exercises given, return to see me in approximately 1 month.

## 2020-02-15 NOTE — Progress Notes (Signed)
    Procedures performed today:    None.  Independent interpretation of notes and tests performed by another provider:   None.  Brief History, Exam, Impression, and Recommendations:    Sprain of foot, left Several days ago this pleasant 54 year old female stood up and rolled her left foot inward. She had immediate pain, swelling, bruising. She was seen by Samuel Bouche, NP, x-rays were ordered that showed a possible Lisfranc injury, MRI was appropriately ordered as a follow-up. There are no fractures on the MRI, there is some edema in the midfoot bones and ligaments, but the Lisfranc ligament looks intact and not widened. We are going to transition into a cam boot, hydrocodone for pain, elevation and icing, rehab exercises given, return to see me in approximately 1 month.    ___________________________________________ Gwen Her. Dianah Field, M.D., ABFM., CAQSM. Primary Care and [place] Instructor of [place] of [facility] of Medicine

## 2020-03-14 ENCOUNTER — Encounter: Payer: Self-pay | Admitting: Sports Medicine

## 2020-03-14 ENCOUNTER — Other Ambulatory Visit: Payer: Self-pay

## 2020-03-14 ENCOUNTER — Ambulatory Visit (INDEPENDENT_AMBULATORY_CARE_PROVIDER_SITE_OTHER): Payer: Managed Care, Other (non HMO) | Admitting: Sports Medicine

## 2020-03-14 DIAGNOSIS — S93622S Sprain of tarsometatarsal ligament of left foot, sequela: Secondary | ICD-10-CM | POA: Diagnosis not present

## 2020-03-14 DIAGNOSIS — S93602A Unspecified sprain of left foot, initial encounter: Secondary | ICD-10-CM

## 2020-03-14 MED ORDER — HYDROCODONE-ACETAMINOPHEN 5-325 MG PO TABS: 1.0000 | ORAL_TABLET | Freq: Three times a day (TID) | ORAL | 0 refills | Status: DC | PRN

## 2020-03-14 NOTE — Addendum Note (Signed)
Addended by: Silverio Decamp on: 03/14/2020 02:45 PM   Modules accepted: Orders

## 2020-03-14 NOTE — Assessment & Plan Note (Signed)
This is a pleasant 54 year old female, she had an inversion injury approximately a month ago. X-rays showed a possible Lisfranc injury, an MRI was ordered that showed possible edema in the midfoot bones and ligaments, the Lisfranc joint/ligament looked intact and not widened. She has not responded to a cam boot for the past month, transitioning into a full cast with postop shoe for walking. Return to see me in 1 month.

## 2020-03-14 NOTE — Progress Notes (Signed)
    Procedures performed today:    Short leg walking cast placed today  Independent interpretation of notes and tests performed by another provider:   None.  Brief History, Exam, Impression, and Recommendations:    [name], left, sequela This is a pleasant 54 year old female, she had an inversion injury approximately a month ago. X-rays showed a possible Lisfranc injury, an MRI was ordered that showed possible edema in the midfoot bones and ligaments, the Lisfranc joint/ligament looked intact and not widened. She has not responded to a cam boot for the past month, transitioning into a full cast with postop shoe for walking. Return to see me in 1 month.    ___________________________________________ Gwen Her. Dianah Field, M.D., ABFM., CAQSM. Primary Care and [place] Instructor of [place] of [facility] of Medicine

## 2020-03-18 ENCOUNTER — Other Ambulatory Visit: Payer: Self-pay | Admitting: Medical-Surgical

## 2020-03-18 DIAGNOSIS — G4700 Insomnia, unspecified: Secondary | ICD-10-CM

## 2020-03-19 ENCOUNTER — Other Ambulatory Visit: Payer: Self-pay

## 2020-03-19 ENCOUNTER — Ambulatory Visit (INDEPENDENT_AMBULATORY_CARE_PROVIDER_SITE_OTHER): Payer: Managed Care, Other (non HMO) | Admitting: Sports Medicine

## 2020-03-19 DIAGNOSIS — S93622S Sprain of tarsometatarsal ligament of left foot, sequela: Secondary | ICD-10-CM

## 2020-03-19 NOTE — Progress Notes (Signed)
    Procedures performed today:    None.  Independent interpretation of notes and tests performed by another provider:   None.  Brief History, Exam, Impression, and Recommendations:    [name], left, sequela This is a very pleasant 54 year old female, I treated her back on the 28th of last month, she had had an inversion injury, x-rays showed possible widening of the Lisfranc joint, ultimately an MRI showed edema in the midfoot bones, and ligaments, the Lisfranc joint/ligament looks intact and not widened on MRI. We transitioned her into a full cast with plans for immobilization for an additional month after initial period of Cam boot immobilization for a month. At this point she is having increasing pain in the cast, I remove the cast and we did find a pressure sore over the dorsum of her first MTP. Wound was dressed, I like to see her back in about a week, if healing of the pressure sores noted we will place another cast. Of note she was no longer tender over her midfoot bones or her Lisfranc joint.    ___________________________________________ Gwen Her. Dianah Field, M.D., ABFM., CAQSM. Primary Care and [place] Instructor of [place] of [facility] of Medicine

## 2020-03-19 NOTE — Progress Notes (Signed)
Subjective:    CC: boil in bikini area  HPI: Pleasant 54 year old female presenting today for evaluation of a boil that developed on the left inguinal crease approximately 2 weeks ago.  She reports that the area was sore and the bone is developing so she followed the old lifestyle and put his affect back with which brought it to ahead.  Since then the boil has ruptured and she notes some thick yellow drainage from it.  The area still hard and tender and she would like to have it checked today.  Has not tried any over-the-counter remedies or ointments. Denies fever, chills.  I reviewed the past medical history, family history, social history, surgical history, and allergies today and no changes were needed.  Please see the problem list section below in epic for further details.  Past Medical History: Past Medical History:  Diagnosis Date  . Anxiety   . Depression   . Diabetes mellitus without complication ([place])   . H/O syphilis   . HSV-2 infection   . Hypertension associated with diabetes ([place]) 03/30/2018  . Renal insufficiency 10/13/2018  . Sleep apnea    CPAP  . Stroke [facility])    Past Surgical History: Past Surgical History:  Procedure Laterality Date  . ABDOMINAL HYSTERECTOMY    . FOOT SURGERY Right    Social History: Social History   Socioeconomic History  . Marital status: Single    Spouse name: Not on file  . Number of children: 1  . Years of education: Not on file  . Highest education level: Not on file  Occupational History  . Not on file  Tobacco Use  . Smoking status: Former Smoker    Types: Cigarettes    Quit date: 07/27/2012    Years since quitting: 7.6  . Smokeless tobacco: Never Used  Substance and Sexual Activity  . Alcohol use: Yes    Comment: 2-3 drinks/month, wine liquor  . Drug use: No  . Sexual activity: Yes    Partners: Male    Birth control/protection: Surgical  Other Topics Concern  . Not on file  Social History Narrative  . Not on file    Social Determinants of Health   Financial Resource Strain:   . Difficulty of Paying Living Expenses:   Food Insecurity:   . Worried About Charity fundraiser in the Last Year:   . Arboriculturist in the Last Year:   Transportation Needs:   . Film/video editor (Medical):   Marland Kitchen Lack of Transportation (Non-Medical):   Physical Activity:   . Days of Exercise per Week:   . Minutes of Exercise per Session:   Stress:   . Feeling of Stress :   Social Connections:   . Frequency of Communication with Friends and Family:   . Frequency of Social Gatherings with Friends and Family:   . Attends Religious Services:   . Active Member of Clubs or Organizations:   . Attends Archivist Meetings:   Marland Kitchen Marital Status:    Family History: Family History  Problem Relation Age of Onset  . Diabetes Mother   . Heart disease Mother   . Heart failure Mother   . Diabetes Sister   . Kidney failure Sister   . Other Brother        quadraplegia  . Diabetes Maternal Grandmother   . Diabetes Brother   . Kidney failure Brother   . Diabetes Brother   . Kidney failure Father   .  Colon cancer Neg Hx   . Esophageal cancer Neg Hx   . Pancreatic cancer Neg Hx   . Stomach cancer Neg Hx   . Liver disease Neg Hx    Allergies: Allergies  Allergen Reactions  . Latex Itching and Swelling  . Other Dermatitis    FREESTYLE LIBRE SENSOR- cellulitis, wound on skin   . Adhesive [Tape]     From dexcom   Medications: See med rec.  Review of Systems: See HPI for pertinent positives and negatives.   Objective:    General: Well Developed, well nourished, and in no acute distress.  Neuro: Alert and oriented x3.  HEENT: Normocephalic, atraumatic. Skin: Warm and dry.  2.5 cm x 3 cm area of induration in the left inguinal crease with a 0.5 cm deroofed area in the center.  No drainage noted.  No erythema, warmth, or area of fluctuance noted. Cardiac: Regular rate and rhythm, no murmurs rubs or gallops,  no lower extremity edema.  Respiratory: Clear to auscultation bilaterally. Not using accessory muscles, speaking in full sentences. Left foot: Small 0.5-1 cm round ulcer first MTP noticed yesterday with cast removal.  No erythema, warmth, or drainage noted.  Intact blister to fifth toe around proximal and lateral borders of the nail, nontender.  Impression and Recommendations:    1. Screening for HIV (human immunodeficiency virus) Order for HIV antibody screening entered for completion with next lab draw. - HIV Antibody (routine testing w rflx)  2. Furuncle of groin No area of fluctuance today, no indication for I&D.  Given uncontrolled diabetes and immunocompromised situation in conjunction with foot ulcer, starting doxycycline twice daily x7 days.  Advised patient to keep area clean and dry, may consider using antibiotic ointment topically.  Keep covered with dry gauze. - doxycycline (VIBRA-TABS) 100 MG tablet; Take 1 tablet (100 mg total) by mouth 2 (two) times daily for 7 days.  Dispense: 14 tablet; Refill: 0  3. Yeast infection Patient admits to being prone to yeast infections while taking antibiotics.  Prescribing Diflucan 150 mg tablet x1 dose for prophylaxis. - fluconazole (DIFLUCAN) 150 MG tablet; Take 1 tablet (150 mg total) by mouth once for 1 dose.  Dispense: 1 tablet; Refill: 0  Return for follow up with Dr. Darene Lamer. as scheduled. ___________________________________________ Clearnce Sorrel, DNP, APRN, FNP-BC Primary Care and Sports Medicine [place]

## 2020-03-19 NOTE — Assessment & Plan Note (Signed)
This is a very pleasant 54 year old female, I treated her back on the 28th of last month, she had had an inversion injury, x-rays showed possible widening of the Lisfranc joint, ultimately an MRI showed edema in the midfoot bones, and ligaments, the Lisfranc joint/ligament looks intact and not widened on MRI. We transitioned her into a full cast with plans for immobilization for an additional month after initial period of Cam boot immobilization for a month. At this point she is having increasing pain in the cast, I remove the cast and we did find a pressure sore over the dorsum of her first MTP. Wound was dressed, I like to see her back in about a week, if healing of the pressure sores noted we will place another cast. Of note she was no longer tender over her midfoot bones or her Lisfranc joint.

## 2020-03-20 ENCOUNTER — Ambulatory Visit (INDEPENDENT_AMBULATORY_CARE_PROVIDER_SITE_OTHER): Payer: Managed Care, Other (non HMO) | Admitting: Medical-Surgical

## 2020-03-20 ENCOUNTER — Encounter: Payer: Self-pay | Admitting: Medical-Surgical

## 2020-03-20 VITALS — BP 148/92 | HR 108 | Temp 97.6°F | Ht 67.0 in | Wt 208.1 lb

## 2020-03-20 DIAGNOSIS — B379 Candidiasis, unspecified: Secondary | ICD-10-CM | POA: Diagnosis not present

## 2020-03-20 DIAGNOSIS — Z114 Encounter for screening for human immunodeficiency virus [HIV]: Secondary | ICD-10-CM | POA: Diagnosis not present

## 2020-03-20 DIAGNOSIS — L02224 Furuncle of groin: Secondary | ICD-10-CM

## 2020-03-20 MED ORDER — FLUCONAZOLE 150 MG PO TABS: 150.0000 mg | ORAL_TABLET | Freq: Once | ORAL | 0 refills | Status: AC

## 2020-03-20 MED ORDER — DOXYCYCLINE HYCLATE 100 MG PO TABS: 100.0000 mg | ORAL_TABLET | Freq: Two times a day (BID) | ORAL | 0 refills | Status: AC

## 2020-03-26 ENCOUNTER — Ambulatory Visit (INDEPENDENT_AMBULATORY_CARE_PROVIDER_SITE_OTHER): Payer: Managed Care, Other (non HMO) | Admitting: Sports Medicine

## 2020-03-26 DIAGNOSIS — S93622S Sprain of tarsometatarsal ligament of left foot, sequela: Secondary | ICD-10-CM

## 2020-03-26 LAB — HM DIABETES EYE EXAM

## 2020-03-26 NOTE — Progress Notes (Signed)
    Procedures performed today:    None.  Independent interpretation of notes and tests performed by another provider:   None.  Brief History, Exam, Impression, and Recommendations:    [name], left, sequela This is a very pleasant 54 year old female, she has what appears to be a Lisfranc sprain on MRI with T2 edema in the midfoot, no widening of the Lisfranc joint itself. We placed her in a cast on 28 May, 5 days later she was having significant pain, the cast was removed and she was found to have a pressure sore, I brought her back today with hopes of putting on another cast but her pressure sore is still open, I would like to wait at least another 2 weeks. Fortunately today she has no pain at the base of the second metatarsal or over the midfoot bones dorsally, so the short 5-day period of immobilization may have been enough to heal her injury. She will continue with her postop shoe, and I would like to see her back in 2 weeks, if any tenderness we will place another cast, if she is completely asymptomatic I will simply transition her into a regular shoe.    ___________________________________________ Gwen Her. Dianah Field, M.D., ABFM., CAQSM. Primary Care and [place] Instructor of [place] of [facility] of Medicine

## 2020-03-26 NOTE — Assessment & Plan Note (Signed)
This is a very pleasant 54 year old female, she has what appears to be a Lisfranc sprain on MRI with T2 edema in the midfoot, no widening of the Lisfranc joint itself. We placed her in a cast on 28 May, 5 days later she was having significant pain, the cast was removed and she was found to have a pressure sore, I brought her back today with hopes of putting on another cast but her pressure sore is still open, I would like to wait at least another 2 weeks. Fortunately today she has no pain at the base of the second metatarsal or over the midfoot bones dorsally, so the short 5-day period of immobilization may have been enough to heal her injury. She will continue with her postop shoe, and I would like to see her back in 2 weeks, if any tenderness we will place another cast, if she is completely asymptomatic I will simply transition her into a regular shoe.

## 2020-04-03 ENCOUNTER — Other Ambulatory Visit: Payer: Self-pay | Admitting: Medical-Surgical

## 2020-04-03 DIAGNOSIS — K5902 Outlet dysfunction constipation: Secondary | ICD-10-CM

## 2020-04-09 ENCOUNTER — Ambulatory Visit: Payer: Managed Care, Other (non HMO) | Admitting: Sports Medicine

## 2020-04-10 ENCOUNTER — Ambulatory Visit (INDEPENDENT_AMBULATORY_CARE_PROVIDER_SITE_OTHER): Payer: Managed Care, Other (non HMO) | Admitting: Sports Medicine

## 2020-04-10 ENCOUNTER — Telehealth: Payer: Self-pay | Admitting: Medical-Surgical

## 2020-04-10 ENCOUNTER — Other Ambulatory Visit: Payer: Self-pay

## 2020-04-10 DIAGNOSIS — S93622S Sprain of tarsometatarsal ligament of left foot, sequela: Secondary | ICD-10-CM | POA: Diagnosis not present

## 2020-04-10 DIAGNOSIS — S93602A Unspecified sprain of left foot, initial encounter: Secondary | ICD-10-CM | POA: Diagnosis not present

## 2020-04-10 DIAGNOSIS — G4700 Insomnia, unspecified: Secondary | ICD-10-CM

## 2020-04-10 MED ORDER — DOXEPIN HCL 6 MG PO TABS: 6.0000 mg | ORAL_TABLET | Freq: Every evening | ORAL | 2 refills | Status: DC | PRN

## 2020-04-10 MED ORDER — HYDROCODONE-ACETAMINOPHEN 5-325 MG PO TABS: 1.0000 | ORAL_TABLET | Freq: Three times a day (TID) | ORAL | 0 refills | Status: DC | PRN

## 2020-04-10 NOTE — Assessment & Plan Note (Addendum)
Helen Perez returns, she is a pleasant 54 year old female, we have been treating her for what appears to be a Lisfranc sprain an MRI with T2 edema in the midfoot but no widening of the Lisfranc joint itself. We will place her in a cast on May 28, she was having significant pain and we remove the cast, she was found to have a pressure sore. We placed her in a postop shoe, at this point she is still having some pain, but is agreeable to continue with postop shoe, she declines a cast today. We will do a postop shoe for at least another month, she understands that this could take 3 months to heal. I have also filled out her disability paperwork today, we will scan a copy into the medical record, and she will be out of work for 3 months. I added a scaphoid pad into her postop shoe today. I would like to see her back in 1 month.

## 2020-04-10 NOTE — Telephone Encounter (Signed)
Pt states Joy told her to call when she needs a refill on her sleep med because she has to take two instead of one pill.

## 2020-04-10 NOTE — Progress Notes (Signed)
    Procedures performed today:    None.  Independent interpretation of notes and tests performed by another provider:   None.  Brief History, Exam, Impression, and Recommendations:    [name], left, sequela Helen Perez returns, she is a pleasant 54 year old female, we have been treating her for what appears to be a Lisfranc sprain an MRI with T2 edema in the midfoot but no widening of the Lisfranc joint itself. We will place her in a cast on May 28, she was having significant pain and we remove the cast, she was found to have a pressure sore. We placed her in a postop shoe, at this point she is still having some pain, but is agreeable to continue with postop shoe, she declines a cast today. We will do a postop shoe for at least another month, she understands that this could take 3 months to heal. I have also filled out her disability paperwork today, we will scan a copy into the medical record, and she will be out of work for 3 months. I added a scaphoid pad into her postop shoe today. I would like to see her back in 1 month.    ___________________________________________ Gwen Her. Dianah Field, M.D., ABFM., CAQSM. Primary Care and [place] Instructor of [place] of [facility] of Medicine

## 2020-04-11 NOTE — Telephone Encounter (Signed)
Pt aware Rx has been sent to the pharmacy. No further questions or concerns at this time.   

## 2020-04-25 ENCOUNTER — Other Ambulatory Visit: Payer: Self-pay

## 2020-04-25 ENCOUNTER — Ambulatory Visit (INDEPENDENT_AMBULATORY_CARE_PROVIDER_SITE_OTHER): Payer: Managed Care, Other (non HMO) | Admitting: Internal Medicine

## 2020-04-25 ENCOUNTER — Encounter: Payer: Self-pay | Admitting: Internal Medicine

## 2020-04-25 VITALS — BP 148/78 | HR 116 | Ht 67.0 in | Wt 215.4 lb

## 2020-04-25 DIAGNOSIS — Z794 Long term (current) use of insulin: Secondary | ICD-10-CM | POA: Diagnosis not present

## 2020-04-25 DIAGNOSIS — E1165 Type 2 diabetes mellitus with hyperglycemia: Secondary | ICD-10-CM | POA: Diagnosis not present

## 2020-04-25 DIAGNOSIS — E1159 Type 2 diabetes mellitus with other circulatory complications: Secondary | ICD-10-CM

## 2020-04-25 DIAGNOSIS — E1142 Type 2 diabetes mellitus with diabetic polyneuropathy: Secondary | ICD-10-CM | POA: Diagnosis not present

## 2020-04-25 LAB — POCT GLYCOSYLATED HEMOGLOBIN (HGB A1C): Hemoglobin A1C: 10 % — AB (ref 4.0–5.6)

## 2020-04-25 MED ORDER — INSULIN PEN NEEDLE 29G X 5MM MISC: 1.0000 | 11 refills | Status: DC

## 2020-04-25 MED ORDER — NOVOLOG FLEXPEN 100 UNIT/ML ~~LOC~~ SOPN: 12.0000 [IU] | PEN_INJECTOR | Freq: Three times a day (TID) | SUBCUTANEOUS | 11 refills | Status: DC

## 2020-04-25 MED ORDER — LANTUS SOLOSTAR 100 UNIT/ML ~~LOC~~ SOPN: 36.0000 [IU] | PEN_INJECTOR | Freq: Every day | SUBCUTANEOUS | 11 refills | Status: DC

## 2020-04-25 NOTE — Progress Notes (Signed)
Name: Helen Perez  Age/ Sex: 54 y.o., female   MRN/ DOB: [id number], November 16, 1965     PCP: Samuel Bouche, NP   Reason for Endocrinology Evaluation: Type 2 Diabetes Mellitus  Initial Endocrine Consultative Visit: 01/30/2020    PATIENT IDENTIFIER: Helen Perez is a 54 y.o. female with a past medical history of HTN, TIA, OSA and T2DM. The patient has followed with Endocrinology clinic since 01/30/2020 for consultative assistance with management of her diabetes.  DIABETIC HISTORY:  Helen Perez was diagnosed with T2DM in 2000. She reports intolerance to Metformin.  She has been on an insulin pump for years.  Her hemoglobin A1c has ranged from 10.1% in 12/10/2019, peaking at 15.7% in 2016.  On her initial visit to our clinic her A1c was 12.5% , she was on Ozempic and novolog through insulin pump. We did not make any changes as she  Uses the pump only periodically as well as the Decox. Chronic hx of non-compliance, and was given the option to switch to MDI vs using the pump properly , she opted to continue with the pump at the time.     Works 3 pm to 2:30 Am , goes to bed at 4:30 , wakes up at 10- 11 Am   SUBJECTIVE:   During the last visit (01/30/2020): A1c 12.5% . Continued Ozempic and pump   Today (04/25/2020): Helen Perez is here for a follow up on diabetes  She checks her blood sugars multiple  times daily, through the dexcom. The patient has not had hypoglycemic episodes since the last clinic visit.   Pump and meter download:    Pump   Omnipod   Settings   Insulin type   NOVOLOG    Basal rate       0000-0400 1.3 u/h    0400-1800  1.4 u/h    1800-0000  1.3 u/h       I:C ratio       0000 8.0      AIT 0000 4 hrs           Sensitivity       0000  25       Goal       0000  120         HOME DIABETES REGIMEN:  Ozempic Novolog   Statin: Yes ACE-I/ARB: yes     CONTINUOUS GLUCOSE MONITORING RECORD INTERPRETATION    Dates of  Recording: 6/26-04/25/2020  Sensor description:Dexcom  Results statistics:   CGM use % of time 57  Average and SD 265/82  Time in range    17    %  % Time Above 180 27  % Time above 250 56  % Time Below target 0       Glycemic patterns summary: hyperglycemia all along  Hyperglycemic episodes  All day and night  Hypoglycemic episodes occurred none  Overnight periods: trends down         DIABETIC COMPLICATIONS: Microvascular complications:   Neuropathy  Denies: retinopathy,  CKD  Last eye exam: Completed 2021  Macrovascular complications:   CVA  Denies: CAD, PVD   HISTORY:  Past Medical History:  Past Medical History:  Diagnosis Date   Anxiety    Depression    Diabetes mellitus without complication ([place])    H/O syphilis    HSV-2 infection    Hypertension associated with diabetes ([place]) 03/30/2018   Renal insufficiency 10/13/2018   Sleep apnea    CPAP   Stroke ([place])  Past Surgical History:  Past Surgical History:  Procedure Laterality Date   ABDOMINAL HYSTERECTOMY     FOOT SURGERY Right     Social History:  reports that she quit smoking about 7 years ago. Her smoking use included cigarettes. She has never used smokeless tobacco. She reports current alcohol use. She reports that she does not use drugs. Family History:  Family History  Problem Relation Age of Onset   Diabetes Mother    Heart disease Mother    Heart failure Mother    Diabetes Sister    Kidney failure Sister    Other Brother        quadraplegia   Diabetes Maternal Grandmother    Diabetes Brother    Kidney failure Brother    Diabetes Brother    Kidney failure Father    Colon cancer Neg Hx    Esophageal cancer Neg Hx    Pancreatic cancer Neg Hx    Stomach cancer Neg Hx    Liver disease Neg Hx      HOME MEDICATIONS: Allergies as of 04/25/2020      Reactions   Latex Itching, Swelling   Other Dermatitis   FREESTYLE LIBRE SENSOR-  cellulitis, wound on skin    Adhesive [tape]    From dexcom      Medication List       Accurate as of April 25, 2020  5:37 PM. If you have any questions, ask your nurse or doctor.        STOP taking these medications   NOVOLOG Pump Back Replaced by: NovoLOG FlexPen 100 UNIT/ML FlexPen Stopped by: Dorita Sciara, MD   OmniPod Dash 5 Pack Pods Misc Stopped by: Dorita Sciara, MD     TAKE these medications   acetaminophen 650 MG CR tablet Commonly known as: TYLENOL Take 1 tablet by mouth as needed.   aspirin EC 81 MG tablet Take 1 tablet by mouth daily.   atorvastatin 40 MG tablet Commonly known as: LIPITOR Take 1 tablet by mouth daily.   Baqsimi One Pack 3 MG/DOSE Powd Generic drug: Glucagon Place 1 Dose into the nose daily as needed.   CULTURELLE PROBIOTICS PO Take 200 mg by mouth daily.   Dexcom G6 Sensor Misc 1 Device. Every 10 days   Doxepin HCl 6 MG Tabs Take 1 tablet (6 mg total) by mouth at bedtime as needed.   fexofenadine-pseudoephedrine 180-240 MG 24 hr tablet Commonly known as: ALLEGRA-D 24 Take 1 tablet by mouth daily as needed.   furosemide 20 MG tablet Commonly known as: LASIX Take 1 tablet by mouth daily.   HYDROcodone-acetaminophen 5-325 MG tablet Commonly known as: NORCO/VICODIN Take 1 tablet by mouth every 8 (eight) hours as needed for moderate pain.   Insulin Pen Needle 29G X 5MM Misc 1 Device by Does not apply route as directed. Started by: Dorita Sciara, MD   Lantus SoloStar 100 UNIT/ML Solostar Pen Generic drug: insulin glargine Inject 36 Units into the skin daily. Started by: Dorita Sciara, MD   Magnesium 400 MG Tabs Take 1 tablet by mouth daily.   metoCLOPramide 10 MG tablet Commonly known as: REGLAN Take 0.5 tablets (5 mg total) by mouth 3 (three) times daily as needed for nausea (nausea/reflux).   MULTIPLE VITAMINS PO Take 1 tablet by mouth daily.   NovoLOG FlexPen 100 UNIT/ML FlexPen Generic drug:  insulin aspart Inject 12 Units into the skin 3 (three) times daily with meals. Replaces: NOVOLOG [place] Started by: Mammie Lorenzo  J Avrey Flanagin, MD   Ozempic (1 MG/DOSE) 4 MG/3ML Sopn Generic drug: Semaglutide (1 MG/DOSE) Inject 1 mg into the skin once a week.   pregabalin 150 MG capsule Commonly known as: LYRICA Take 150 mg by mouth 2 (two) times daily.   Skin Tac Adhesive Barrier Wipe Misc 1 applicator by Does not apply route as directed.   Trulance 3 MG Tabs Generic drug: Plecanatide TAKE 1 TABLET BY MOUTH DAILY   valACYclovir 500 MG tablet Commonly known as: VALTREX Take 1 tablet by mouth daily.        OBJECTIVE:   Vital Signs: BP (!) 148/78 (BP Location: Left Arm, Patient Position: Sitting, Cuff Size: Normal)    Pulse (!) 116    Ht 5\' 7"  (1.702 m)    Wt 215 lb 6.4 oz (97.7 kg)    SpO2 98%    BMI 33.74 kg/m   Wt Readings from Last 3 Encounters:  04/25/20 215 lb 6.4 oz (97.7 kg)  03/20/20 208 lb 1.6 oz (94.4 kg)  02/13/20 217 lb 3.2 oz (98.5 kg)     Exam: General: Pt appears well and is in NAD  Lungs: Clear with good BS bilat with no rales, rhonchi, or wheezes  Heart: RRR   Abdomen: Normoactive bowel sounds, soft, nontender  Extremities: No pretibial edema. Left foot boot  Neuro: MS is good with appropriate affect, pt is alert and Ox3    DM foot exam: 01/30/2020  The skin of the feet is intact without sores or ulcerations. The pedal pulses are 2+ on right and 2+ on left. The sensation is decreased  to a screening 5.07, 10 gram monofilament bilaterally    DATA REVIEWED:  Lab Results  Component Value Date   HGBA1C 10.0 (A) 04/25/2020   HGBA1C 12.5 (A) 01/30/2020   Lab Results  Component Value Date   MICROALBUR 38.5 (H) 01/30/2020   LDLCALC 56 01/08/2020   CREATININE 1.07 (H) 01/08/2020   Lab Results  Component Value Date   MICRALBCREAT 59.1 (H) 01/30/2020     Lab Results  Component Value Date   CHOL 134 01/08/2020   HDL 60 01/08/2020   LDLCALC 56  01/08/2020   TRIG 96 01/08/2020   CHOLHDL 2.2 01/08/2020         ASSESSMENT / PLAN / RECOMMENDATIONS:   1) Type 2 Diabetes Mellitus, Poorly controlled, With neuropathic and macrovascular  complications - Most recent A1c of 10.0 %. Goal A1c < 7.0 %.    - Pt continues with hyperglycemia and intermittent use of the pump , pt agreed to switch to MDI regimen instead of the insulin pumps.  - Not a candidate for SGLT-2 inhibitors at this time, due to recurrent genital infections at this time, wil consider once hyperglycemia improves.  - will make the following changes     MEDICATIONS: - Stop the Pump  - Start Lantus 36 units once daily  - Novolog 12 units with each meal  - Continue Ozempic 1 mg weekly    EDUCATION / INSTRUCTIONS:  BG monitoring instructions: Patient is instructed to check her blood sugars 4 times a day, before meals and bedtime .  Call [facility] if: BG persistently < 70 or > 300.  I reviewed the Rule of 15 for the treatment of hypoglycemia in detail with the patient. Literature supplied.    2) Diabetic complications:   Eye: Does not have known diabetic retinopathy.   Neuro/ Feet: Does have known diabetic peripheral neuropathy .  Renal: Patient does not have known baseline CKD. She   is  on an ACEI/ARB at present.      F/U in 3 months    Signed electronically by: Mack Guise, MD  [facility]  [facility] [address] [place], [place] [address] Phone: [phone] FAX: [phone]   CC: Samuel Bouche, [place] [address] Phone: [phone]  Fax: [phone]  Return to Endocrinology clinic as below: Future Appointments  Date Time Provider [place]  05/06/2020  1:00 PM Samuel Bouche, NP [facility] None  05/08/2020 10:15 AM Silverio Decamp, MD [facility] None  08/01/2020  2:40 PM Delesha Perez, Melanie Crazier, MD [facility] None

## 2020-04-25 NOTE — Patient Instructions (Signed)
-   Stop the Pump  - Start Lantus 36 units once daily  - Novolog 12 units with each meal  - Continue Ozempic 1 mg weekly      HOW TO TREAT LOW BLOOD SUGARS (Blood sugar LESS THAN 70 MG/DL)  Please follow the RULE OF 15 for the treatment of hypoglycemia treatment (when your (blood sugars are less than 70 mg/dL)    STEP 1: Take 15 grams of carbohydrates when your blood sugar is low, which includes:   3-4 GLUCOSE TABS  OR  3-4 OZ OF JUICE OR REGULAR SODA OR  ONE TUBE OF GLUCOSE GEL     STEP 2: RECHECK blood sugar in 15 MINUTES STEP 3: If your blood sugar is still low at the 15 minute recheck --> then, go back to STEP 1 and treat AGAIN with another 15 grams of carbohydrates.

## 2020-05-05 ENCOUNTER — Other Ambulatory Visit: Payer: Self-pay

## 2020-05-05 MED ORDER — DEXCOM G6 SENSOR MISC: 1.0000 | 6 refills | Status: DC

## 2020-05-05 MED ORDER — DEXCOM G6 TRANSMITTER MISC: 1.0000 | 1 refills | Status: DC

## 2020-05-06 ENCOUNTER — Encounter: Payer: Self-pay | Admitting: Medical-Surgical

## 2020-05-06 ENCOUNTER — Other Ambulatory Visit: Payer: Self-pay

## 2020-05-06 ENCOUNTER — Ambulatory Visit (INDEPENDENT_AMBULATORY_CARE_PROVIDER_SITE_OTHER): Payer: Managed Care, Other (non HMO) | Admitting: Medical-Surgical

## 2020-05-06 VITALS — BP 143/88 | HR 112 | Temp 98.0°F | Ht 67.0 in | Wt 217.4 lb

## 2020-05-06 DIAGNOSIS — K219 Gastro-esophageal reflux disease without esophagitis: Secondary | ICD-10-CM | POA: Diagnosis not present

## 2020-05-06 DIAGNOSIS — D229 Melanocytic nevi, unspecified: Secondary | ICD-10-CM

## 2020-05-06 DIAGNOSIS — R600 Localized edema: Secondary | ICD-10-CM

## 2020-05-06 DIAGNOSIS — Z6834 Body mass index (BMI) 34.0-34.9, adult: Secondary | ICD-10-CM

## 2020-05-06 DIAGNOSIS — G4700 Insomnia, unspecified: Secondary | ICD-10-CM

## 2020-05-06 DIAGNOSIS — K5900 Constipation, unspecified: Secondary | ICD-10-CM | POA: Diagnosis not present

## 2020-05-06 MED ORDER — BELSOMRA 10 MG PO TABS: 10.0000 mg | ORAL_TABLET | Freq: Every evening | ORAL | 0 refills | Status: DC | PRN

## 2020-05-06 NOTE — Progress Notes (Signed)
Subjective:    CC: insomnia, constipation/GERD, moles, BLE edema, weight concerns  HPI: Helen 54 year old Perez presenting today for follow-up on insomnia and constipation/GERD.  Also has concerns about lower extremity edema, weight gain, and small moles on her neck.  Insomnia-taking doxepin 6 mg nightly as needed for sleep.  Reports this is not helping her sleep.  Has tried hydroxyzine, trazodone, and 3 mg doxepin with no results.  Has difficulty falling asleep and staying asleep.  Constipation/GERD-struggles with constipation.  Was previously prescribed Trulance which seemed to help tremendously.  Per follow-up with GI she was to continue Trulance.  Refill sent to the pharmacy on file in June but patient reports that she never received the medication.  Has been out of her medications since then with a return in her constipation/GERD symptoms.  Lower extremity edema-notes that she has had that left lower extremity edema since her foot injury but now feels that her right lower leg and foot are also swelling.  History of renal insufficiency, last checked 3 months ago with stable numbers.  Admits to having compression stockings to wear but prefers not to wear these during the summer.  Moles on neck-has had several small moles along her left neck which has become very irritated and she would like to have them removed.  Notes that her seatbelt rubs against them painfully when she is driving in the car.  Weight concerns-has been dealing with abdominal obesity for a while.  Is interested in medications to help her lose weight.  Used phentermine in the past and lost approximately 15-20 pounds, tolerating the medication well.  Is currently on Ozempic for diabetes control and has been taking that for approximately 1 year.  Has seen no beneficial weight loss while taking that medication.  I reviewed the past medical history, family history, social history, surgical history, and allergies today and no  changes were needed.  Please see the problem list section below in epic for further details.  Past Medical History: Past Medical History:  Diagnosis Date  . Anxiety   . Depression   . Diabetes mellitus without complication ([place])   . H/O syphilis   . HSV-2 infection   . Hypertension associated with diabetes ([place]) 03/30/2018  . Renal insufficiency 10/13/2018  . Sleep apnea    CPAP  . Stroke [facility])    Past Surgical History: Past Surgical History:  Procedure Laterality Date  . ABDOMINAL HYSTERECTOMY    . FOOT SURGERY Right    Social History: Social History   Socioeconomic History  . Marital status: Single    Spouse name: Not on file  . Number of children: 1  . Years of education: Not on file  . Highest education level: Not on file  Occupational History  . Not on file  Tobacco Use  . Smoking status: Former Smoker    Types: Cigarettes    Quit date: 07/27/2012    Years since quitting: 7.7  . Smokeless tobacco: Never Used  Vaping Use  . Vaping Use: Never used  Substance and Sexual Activity  . Alcohol use: Yes    Comment: 2-3 drinks/month, wine liquor  . Drug use: No  . Sexual activity: Yes    Partners: Male    Birth control/protection: Surgical  Other Topics Concern  . Not on file  Social History Narrative  . Not on file   Social Determinants of Health   Financial Resource Strain:   . Difficulty of Paying Living Expenses:   Food Insecurity:   .  Worried About Charity fundraiser in the Last Year:   . Arboriculturist in the Last Year:   Transportation Needs:   . Film/video editor (Medical):   Marland Kitchen Lack of Transportation (Non-Medical):   Physical Activity:   . Days of Exercise per Week:   . Minutes of Exercise per Session:   Stress:   . Feeling of Stress :   Social Connections:   . Frequency of Communication with Friends and Family:   . Frequency of Social Gatherings with Friends and Family:   . Attends Religious Services:   . Active Member of Clubs or  Organizations:   . Attends Archivist Meetings:   Marland Kitchen Marital Status:    Family History: Family History  Problem Relation Age of Onset  . Diabetes Mother   . Heart disease Mother   . Heart failure Mother   . Diabetes Sister   . Kidney failure Sister   . Other Brother        quadraplegia  . Diabetes Maternal Grandmother   . Diabetes Brother   . Kidney failure Brother   . Diabetes Brother   . Kidney failure Father   . Colon cancer Neg Hx   . Esophageal cancer Neg Hx   . Pancreatic cancer Neg Hx   . Stomach cancer Neg Hx   . Liver disease Neg Hx    Allergies: Allergies  Allergen Reactions  . Latex Itching and Swelling  . Other Dermatitis    FREESTYLE LIBRE SENSOR- cellulitis, wound on skin   . Adhesive [Tape]     From dexcom   Medications: See med rec.  Review of Systems: See HPI for pertinent positives and negatives.   Objective:    General: Well Developed, well nourished, and in no acute distress.  Neuro: Alert and oriented x3.  HEENT: Normocephalic, atraumatic.  Skin: Warm and dry.  2 Small dark brown pedunculated moles along crease of the left neck with several 1 mm dark brown moles just below extending over the clavicle. Cardiac: Regular rate and rhythm, no murmurs rubs or gallops, + lower extremity edema (mild nonpitting right lower extremity, moderate 3+ pitting to left lower extremity).  Respiratory: Clear to auscultation bilaterally. Not using accessory muscles, speaking in full sentences.   Impression and Recommendations:    1. Insomnia, unspecified type As we have unsuccessfully tried several noncontrolled substances for sleep, sending in Belsomra 10 mg nightly as needed.  This medication does show as covered by her insurance but may need prior Auth.  Notify patient that this may take several days to get pushed through.  2.  GERD/constipation Unsure why patient did not get Trulance as prescribed.  We are reaching out to her pharmacy to see what  the problem is.  Only have more information, we will be able to update.  If needed okay to resend prescription.  3. Benign skin mole Cryotherapy template Procedure: Cryodestruction of: 5 small dark brown moles along the left neck Consent obtained and verified. Time-out conducted. Noted no overlying erythema, induration, or other signs of local infection. Completed without difficulty using Cryo-Gun. Advised to call if fevers/chills, erythema, induration, drainage, or persistent bleeding.  Procedure: Skin Mole removal Informed consent:  Discussed risks (permanent scarring, infection, pain, bleeding, bruising, redness, and recurrence of the lesion) and benefits of the procedure, as well as the alternatives.  She is aware that moles are benign lesions, and their removal is often not considered medically necessary.  Informed consent  was obtained. Anesthesia: 1% lidocaine with epinephrine The area was prepared and draped in a standard fashion. Snip removal was performed.   The area was cleaned with chlorhexidine and sterile dressings were applied.   The patient tolerated procedure well. The patient was instructed on post-op care.   Number of lesions removed: 2  5. Bilateral lower extremity edema Renal function stable.  Recommend venous insufficiency compounded by truncal obesity is exacerbating her edema.  Recommend compression stockings worn during the day and elevating legs at night.  Avoid adding salt to foods and limit processed foods as this can worsen her edema.  Recommend increasing fluid intake as well.  6. BMI 34.0-34.9,adult Unsure of medications that are appropriate for weight loss with her multiple health concerns.  Unfortunately, she has not seen any weight loss using Ozempic as prescribed.  Not sure that phentermine will be an appropriate option.  Advised patient that we will do more research and let her know my findings on weight loss medications.  Follow-up: Return in 3 months  for chronic disease follow-up.  Please keep appointment as scheduled for follow-up with Dr. Darene Lamer regarding left foot injury. ___________________________________________ Clearnce Sorrel, DNP, APRN, FNP-BC Primary Care and Sports Medicine [place]

## 2020-05-07 ENCOUNTER — Telehealth: Payer: Self-pay

## 2020-05-07 NOTE — Telephone Encounter (Signed)
I called and spoke with Helen Perez pharmacist who states they did receive the Rx for Trulance on 04/03/2020 and it is active on her profile. They stated they would be able to go ahead and partially fill the Rx today because they only have #30 tabs on hand but will have the remainder of the #90 tabs on Friday. I called and LVM for pt sharing this information with her. Advised she call back if she has any questions or concerns.

## 2020-05-07 NOTE — Telephone Encounter (Signed)
LVM notifying of Helen Perez's recommendations and requested she CB and let us know if she wants the referral to weight management.

## 2020-05-07 NOTE — Telephone Encounter (Signed)
-----   Message from Samuel Bouche, NP sent at 05/07/2020  9:18 AM EDT ----- Regarding: FW: Weight loss meds Please share the following message with [name] and see what she wants to do.  With her co-morbidities and uncontrolled diabetes, my supervising MD and I both feel that adding phentermine would not be recommended. We discussed weight loss options for you and I think the best option would be a referral to weight management in either [place] or [place]. They have multiple people on staff there that can help you work toward losing weight including a nutritionist/dietician, a physician, and a therapist who specializes in helping folks change their eating patterns and behaviors. Is this something that you would be willing to do? If so, I will enter that referral today.  Thanks, Samuel Bouche, FNP ----- Message ----- From: Samuel Bouche, NP Sent: 05/06/2020   2:17 PM EDT To: Samuel Bouche, NP Subject: Weight loss meds                               What will work if she is on [name] and wants to lose weight?

## 2020-05-07 NOTE — Telephone Encounter (Signed)
-----   Message from Samuel Bouche, NP sent at 05/06/2020  2:13 PM EDT ----- Regarding: Trulance Can you please contact the pharmacy to check into the Trulance I sent in June? Patient reports she never got it at the pharmacy. It went through according to our system.

## 2020-05-08 ENCOUNTER — Telehealth: Payer: Self-pay | Admitting: *Deleted

## 2020-05-08 ENCOUNTER — Ambulatory Visit (INDEPENDENT_AMBULATORY_CARE_PROVIDER_SITE_OTHER): Payer: Managed Care, Other (non HMO) | Admitting: Sports Medicine

## 2020-05-08 DIAGNOSIS — S93622S Sprain of tarsometatarsal ligament of left foot, sequela: Secondary | ICD-10-CM | POA: Diagnosis not present

## 2020-05-08 NOTE — Telephone Encounter (Signed)
LVM (2nd attempt) notifying of Joy's recommendations and requested she CB and let us know if she wants the referral to weight management.

## 2020-05-08 NOTE — Assessment & Plan Note (Addendum)
This is a very pleasant 54 year old female, I have been treating her now for about 3 months, prior to presentation she had an inversion injury to the left foot, severe pain, swelling, ultimately an MRI was obtained that showed edema-like marrow signal within all 3 cuneiforms, cuboid, and the bases of the second through fifth metatarsals. There is mild soft tissue edema surrounding the Lisfranc ligament, which remains intact. No evidence of subluxation or dislocation. Mild subcutaneous soft tissue swelling along the dorsal aspect of the foot. Mild talonavicular and naviculocuneiform joint degenerative changes.  Suspecting a Lisfranc sprain without widening of the Lisfranc ligament we immobilized her in a cast, she did this for a month, persistent discomfort, she had difficulty tolerating the cast so we transitioned her into a postop shoe, she has worn this for the past 2 months, she has been out of work for the past 2 months, unfortunately continues to have pain, swelling over the dorsal midfoot, plantar midfoot. I am going to get an updated MRI, we filled out additional disability paperwork to keep her out of work until November, and I would like a second opinion from podiatry/Dr. Jacqualyn Posey. She is interested in surgical intervention however I wonder if an ultrasound-guided injection would be beneficial, I would like her to hear this from him.

## 2020-05-08 NOTE — Telephone Encounter (Signed)
Pt left vm this afternoon saying that the work note needs to have the original out of work date on it, which was June 21.  Fax # [phone].

## 2020-05-08 NOTE — Progress Notes (Signed)
    Procedures performed today:    None.  Independent interpretation of notes and tests performed by another provider:   MRI from [facility] shows marrow edema in all 3 cuneiforms, cuboid, and the bases of the second through the fifth metatarsals with mild soft tissue edema surrounding the Lisfranc ligament which remains intact.  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  Brief History, Exam, Impression, and Recommendations:    [name], left, sequela This is a very pleasant 54 year old female, I have been treating her now for about 3 months, prior to presentation she had an inversion injury to the left foot, severe pain, swelling, ultimately an MRI was obtained that showed edema-like marrow signal within all 3 cuneiforms, cuboid, and the bases of the second through fifth metatarsals. There is mild soft tissue edema surrounding the Lisfranc ligament, which remains intact. No evidence of subluxation or dislocation. Mild subcutaneous soft tissue swelling along the dorsal aspect of the foot. Mild talonavicular and naviculocuneiform joint degenerative changes.  Suspecting a Lisfranc sprain without widening of the Lisfranc ligament we immobilized her in a cast, she did this for a month, persistent discomfort, she had difficulty tolerating the cast so we transitioned her into a postop shoe, she has worn this for the past 2 months, she has been out of work for the past 2 months, unfortunately continues to have pain, swelling over the dorsal midfoot, plantar midfoot. I am going to get an updated MRI, we filled out additional disability paperwork to keep her out of work until November, and I would like a second opinion from podiatry/Dr. Jacqualyn Posey. She is interested in surgical intervention however I wonder if an ultrasound-guided injection would be beneficial, I would like her to hear this from him.   ___________________________________________ Gwen Her.  Dianah Field, M.D., ABFM., CAQSM. Primary Care and [place] [employer] of [employer]

## 2020-05-09 ENCOUNTER — Other Ambulatory Visit: Payer: Self-pay

## 2020-05-09 ENCOUNTER — Ambulatory Visit (INDEPENDENT_AMBULATORY_CARE_PROVIDER_SITE_OTHER): Payer: Managed Care, Other (non HMO) | Admitting: Podiatry

## 2020-05-09 ENCOUNTER — Encounter: Payer: Self-pay | Admitting: Podiatry

## 2020-05-09 ENCOUNTER — Ambulatory Visit (INDEPENDENT_AMBULATORY_CARE_PROVIDER_SITE_OTHER): Payer: Managed Care, Other (non HMO)

## 2020-05-09 VITALS — BP 145/92 | HR 110 | Temp 97.2°F | Resp 16

## 2020-05-09 DIAGNOSIS — R0989 Other specified symptoms and signs involving the circulatory and respiratory systems: Secondary | ICD-10-CM | POA: Diagnosis not present

## 2020-05-09 DIAGNOSIS — S93622S Sprain of tarsometatarsal ligament of left foot, sequela: Secondary | ICD-10-CM

## 2020-05-09 NOTE — Telephone Encounter (Signed)
Letter written and in chart 

## 2020-05-09 NOTE — Progress Notes (Signed)
Subjective:   Patient ID: Helen Perez, female   DOB: 54 y.o.   MRN: [id number]   HPI 54 year old female presents the office today for concerns of a Lisfranc sprain of the left foot.  She was originally seen by her primary care physician on February 05, 2020 for this issue after she stood up and rolled her left foot inward.  She had immediate pain swelling and bruising at that time.  X-rays were originally obtained which showed suspected tiny avulsion fracture of the dorsal talus and equivocal widening of the Lisfranc articulation with concern for possible Lisfranc ligament injury.  MRI was ordered at that time but apparently showed edema to the midfoot bones as well as the ligaments of the Lisfranc ligament looks intact and not widened.  She was initially immobilized in a cast for 1 month however she was not able to tolerate this and she was transitioned to his postop surgical shoe which she has been wearing for the last 2 months.  She continues to have pain and swelling despite immobilization.  MRI was ordered yesterday.  They discussed possible surgical intervention versus ultrasound-guided injection.  She presents today for evaluation.  A1c was 10 on 04/25/2020 however this is down from 12.5 on 01/30/2020  Review of Systems  All other systems reviewed and are negative.  Past Medical History:  Diagnosis Date  . Anxiety   . Depression   . Diabetes mellitus without complication ([place])   . H/O syphilis   . HSV-2 infection   . Hypertension associated with diabetes ([place]) 03/30/2018  . Renal insufficiency 10/13/2018  . Sleep apnea    CPAP  . Stroke [facility])     Past Surgical History:  Procedure Laterality Date  . ABDOMINAL HYSTERECTOMY    . FOOT SURGERY Right      Current Outpatient Medications:  .  acetaminophen (TYLENOL) 650 MG CR tablet, Take 1 tablet by mouth as needed., Disp: , Rfl:  .  aspirin EC 81 MG tablet, Take 1 tablet by mouth daily., Disp: , Rfl:  .  atorvastatin (LIPITOR) 40 MG  tablet, Take 1 tablet by mouth daily. (Patient not taking: Reported on 05/06/2020), Disp: , Rfl: 2 .  Continuous Blood Gluc Sensor (DEXCOM G6 SENSOR) MISC, Inject 1 Device into the skin as directed. Every 10 days, Disp: 3 each, Rfl: 6 .  Continuous Blood Gluc Transmit (DEXCOM G6 TRANSMITTER) MISC, 1 Package by Does not apply route every 3 (three) months., Disp: 3 each, Rfl: 1 .  Doxepin HCl 6 MG TABS, Take 1 tablet (6 mg total) by mouth at bedtime as needed., Disp: 30 tablet, Rfl: 2 .  fexofenadine-pseudoephedrine (ALLEGRA-D 24) 180-240 MG 24 hr tablet, Take 1 tablet by mouth daily as needed., Disp: , Rfl:  .  furosemide (LASIX) 20 MG tablet, Take 1 tablet by mouth daily., Disp: , Rfl:  .  Glucagon (BAQSIMI ONE PACK) 3 MG/DOSE POWD, Place 1 Dose into the nose daily as needed., Disp: , Rfl:  .  HYDROcodone-acetaminophen (NORCO/VICODIN) 5-325 MG tablet, Take 1 tablet by mouth every 8 (eight) hours as needed for moderate pain., Disp: 30 tablet, Rfl: 0 .  insulin aspart (NOVOLOG FLEXPEN) 100 UNIT/ML FlexPen, Inject 12 Units into the skin 3 (three) times daily with meals., Disp: 15 mL, Rfl: 11 .  insulin glargine (LANTUS SOLOSTAR) 100 UNIT/ML Solostar Pen, Inject 36 Units into the skin daily., Disp: 15 mL, Rfl: 11 .  Insulin Pen Needle 29G X 5MM MISC, 1 Device by Does not  apply route as directed., Disp: 150 each, Rfl: 11 .  MULTIPLE VITAMINS PO, Take 1 tablet by mouth daily., Disp: , Rfl:  .  Ostomy Supplies (SKIN TAC ADHESIVE BARRIER WIPE) MISC, 1 applicator by Does not apply route as directed., Disp: 50 each, Rfl: 6 .  pregabalin (LYRICA) 150 MG capsule, Take 150 mg by mouth 2 (two) times daily., Disp: , Rfl:  .  Semaglutide, 1 MG/DOSE, (OZEMPIC, 1 MG/DOSE,) 4 MG/3ML SOPN, Inject 1 mg into the skin once a week., Disp: , Rfl:  .  Suvorexant (BELSOMRA) 10 MG TABS, Take 10 mg by mouth at bedtime as needed., Disp: 30 tablet, Rfl: 0 .  TRULANCE 3 MG TABS, TAKE 1 TABLET BY MOUTH DAILY (Patient not taking:  Reported on 05/06/2020), Disp: 90 tablet, Rfl: 1 .  valACYclovir (VALTREX) 500 MG tablet, Take 1 tablet by mouth daily. , Disp: , Rfl:   Allergies  Allergen Reactions  . Latex Itching and Swelling  . Other Dermatitis    FREESTYLE LIBRE SENSOR- cellulitis, wound on skin   . Adhesive [Tape]     From dexcom       Objective:  Physical Exam  General: AAO x3, NAD  Dermatological: Skin is warm, dry and supple bilateral. There are no open sores, no preulcerative lesions, no rash or signs of infection present.  Vascular: Dorsalis Pedis artery and Posterior Tibial artery pedal pulses are 2/4 bilateral with immedate capillary fill time. There is no pain with calf compression, swelling, warmth, erythema.   Neruologic: Grossly intact via light touch bilateral.   Musculoskeletal: Tenderness on the dorsal aspect the left foot along Lisfranc joint and there is swelling present left foot.  Flexor, extensor tendons appear to be intact.  There is no area of pinpoint tenderness identified otherwise.  Muscular strength 5/5 in all groups tested bilateral.  Gait: Unassisted, Nonantalgic.      Assessment:   Lisfranc injury left foot     Plan:  -Treatment options discussed including all alternatives, risks, and complications -Etiology of symptoms were discussed -X-rays ordered obtained and independently reviewed them with her.  There is diastases of the first and second tarsals and a small fleck fracture present the second metatarsal consistent with Lisfranc injury. -At this point I discussed with her surgical versus conservative treatment.  I discussed surgical intervention however with an A1c of 10 and hesitant to do so at this point.  I would await the MRI and also order vascular testing given vessel calcifications on x-ray.  For now continue with surgical shoe.  Trula Slade DPM

## 2020-05-09 NOTE — Telephone Encounter (Signed)
Letter faxed. Pt notified.

## 2020-05-09 NOTE — Patient Instructions (Signed)
Diabetes Mellitus and Foot Care Foot care is an important part of your health, especially when you have diabetes. Diabetes may cause you to have problems because of poor blood flow (circulation) to your feet and legs, which can cause your skin to:  Become thinner and drier.  Break more easily.  Heal more slowly.  Peel and crack. You may also have nerve damage (neuropathy) in your legs and feet, causing decreased feeling in them. This means that you may not notice minor injuries to your feet that could lead to more serious problems. Noticing and addressing any potential problems early is the best way to prevent future foot problems. How to care for your feet Foot hygiene  Wash your feet daily with warm water and mild soap. Do not use hot water. Then, pat your feet and the areas between your toes until they are completely dry. Do not soak your feet as this can dry your skin.  Trim your toenails straight across. Do not dig under them or around the cuticle. File the edges of your nails with an emery board or nail file.  Apply a moisturizing lotion or petroleum jelly to the skin on your feet and to dry, brittle toenails. Use lotion that does not contain alcohol and is unscented. Do not apply lotion between your toes. Shoes and socks  Wear clean socks or stockings every day. Make sure they are not too tight. Do not wear knee-high stockings since they may decrease blood flow to your legs.  Wear shoes that fit properly and have enough cushioning. Always look in your shoes before you put them on to be sure there are no objects inside.  To break in new shoes, wear them for just a few hours a day. This prevents injuries on your feet. Wounds, scrapes, corns, and calluses  Check your feet daily for blisters, cuts, bruises, sores, and redness. If you cannot see the bottom of your feet, use a mirror or ask someone for help.  Do not cut corns or calluses or try to remove them with medicine.  If you  find a minor scrape, cut, or break in the skin on your feet, keep it and the skin around it clean and dry. You may clean these areas with mild soap and water. Do not clean the area with peroxide, alcohol, or iodine.  If you have a wound, scrape, corn, or callus on your foot, look at it several times a day to make sure it is healing and not infected. Check for: ? Redness, swelling, or pain. ? Fluid or blood. ? Warmth. ? Pus or a bad smell. General instructions  Do not cross your legs. This may decrease blood flow to your feet.  Do not use heating pads or hot water bottles on your feet. They may burn your skin. If you have lost feeling in your feet or legs, you may not know this is happening until it is too late.  Protect your feet from hot and cold by wearing shoes, such as at the beach or on hot pavement.  Schedule a complete foot exam at least once a year (annually) or more often if you have foot problems. If you have foot problems, report any cuts, sores, or bruises to your health care provider immediately. Contact a health care provider if:  You have a medical condition that increases your risk of infection and you have any cuts, sores, or bruises on your feet.  You have an injury that is not   healing.  You have redness on your legs or feet.  You feel burning or tingling in your legs or feet.  You have pain or cramps in your legs and feet.  Your legs or feet are numb.  Your feet always feel cold.  You have pain around a toenail. Get help right away if:  You have a wound, scrape, corn, or callus on your foot and: ? You have pain, swelling, or redness that gets worse. ? You have fluid or blood coming from the wound, scrape, corn, or callus. ? Your wound, scrape, corn, or callus feels warm to the touch. ? You have pus or a bad smell coming from the wound, scrape, corn, or callus. ? You have a fever. ? You have a red line going up your leg. Summary  Check your feet every day  for cuts, sores, red spots, swelling, and blisters.  Moisturize feet and legs daily.  Wear shoes that fit properly and have enough cushioning.  If you have foot problems, report any cuts, sores, or bruises to your health care provider immediately.  Schedule a complete foot exam at least once a year (annually) or more often if you have foot problems. This information is not intended to replace advice given to you by your health care provider. Make sure you discuss any questions you have with your health care provider. Document Revised: 06/27/2019 Document Reviewed: 11/05/2016 Elsevier Patient Education  2020 Elsevier Inc.  

## 2020-05-12 ENCOUNTER — Other Ambulatory Visit: Payer: Self-pay | Admitting: Medical-Surgical

## 2020-05-12 ENCOUNTER — Other Ambulatory Visit: Payer: Self-pay | Admitting: Internal Medicine

## 2020-05-12 ENCOUNTER — Ambulatory Visit: Payer: Managed Care, Other (non HMO) | Admitting: Sports Medicine

## 2020-05-12 ENCOUNTER — Telehealth: Payer: Self-pay | Admitting: Internal Medicine

## 2020-05-12 MED ORDER — WEGOVY 1.7 MG/0.75ML ~~LOC~~ SOAJ: 1.7000 mg | SUBCUTANEOUS | 6 refills | Status: DC

## 2020-05-12 NOTE — Telephone Encounter (Signed)
Pt stated that she was informed by her pharmacy that a PA was needed and I informed her that our office has not received that request yet but that we can complete one once it is faxed.

## 2020-05-12 NOTE — Telephone Encounter (Signed)
Helen Perez,    Please let her know that after a discussion with her PCP and sports medicine team , we have opted to switch Ozempic to [facility] to help with weight loss. They have a coupon so she should get one from her PCP's office because I don;t think we have any.   Start at 1.7 mg weekly    Thanks     Budd Lake, MD  [facility]  [facility] [address] [place], [place] [address] Phone: [phone] FAX: [phone]

## 2020-05-13 ENCOUNTER — Telehealth: Payer: Self-pay | Admitting: Sports Medicine

## 2020-05-13 NOTE — Telephone Encounter (Signed)
[place] disability paperwork filled out

## 2020-05-13 NOTE — Telephone Encounter (Signed)
LVM (3rd attempt) notifying of Helen Perez's recommendations and requested she CB and let us know if she wants the referral to weight management.Multiple attempts to contact patient, all unsuccessful. Unable to contact patient.

## 2020-05-15 ENCOUNTER — Other Ambulatory Visit: Payer: Self-pay

## 2020-05-15 ENCOUNTER — Ambulatory Visit (HOSPITAL_COMMUNITY)
Admission: RE | Admit: 2020-05-15 | Discharge: 2020-05-15 | Disposition: A | Discharge status: Home / Self Care | Payer: Managed Care, Other (non HMO) | Source: Ambulatory Visit | Attending: Podiatry | Admitting: Podiatry

## 2020-05-15 ENCOUNTER — Other Ambulatory Visit: Payer: Self-pay | Admitting: Podiatry

## 2020-05-15 DIAGNOSIS — R0989 Other specified symptoms and signs involving the circulatory and respiratory systems: Secondary | ICD-10-CM | POA: Diagnosis not present

## 2020-05-16 ENCOUNTER — Telehealth: Payer: Self-pay | Admitting: Medical-Surgical

## 2020-05-16 NOTE — Telephone Encounter (Signed)
Received fax from [employer] that Belsomra requires a PA. Information has been sent to the insurance company. Awaiting determination.   

## 2020-05-17 ENCOUNTER — Encounter: Payer: Self-pay | Admitting: Medical-Surgical

## 2020-05-23 ENCOUNTER — Telehealth: Payer: Self-pay | Admitting: *Deleted

## 2020-05-23 ENCOUNTER — Encounter: Payer: Self-pay | Admitting: Medical-Surgical

## 2020-05-23 NOTE — Telephone Encounter (Signed)
I spoke with pt and informed of Dr. Leigh Aurora review of results. Pt states this is the 2nd call she has received for these results. I informed pt I did not have notation of her being called and wanted her to be aware of the results so she would not worry. Pt asked if she was to be seen again by Dr. Jacqualyn Posey.

## 2020-05-23 NOTE — Telephone Encounter (Signed)
-----   Message from Trula Slade, DPM sent at 05/19/2020  4:42 PM EDT ----- Val- please let her know that the circulation is normal.

## 2020-05-23 NOTE — Telephone Encounter (Signed)
I think what is happening is I am now getting a "prelim" in the results then I get a final a few days later and it is getting confusion.   I would like to see her back in about 2 weeks to further discuss. Thanks.

## 2020-05-28 ENCOUNTER — Telehealth: Payer: Self-pay | Admitting: *Deleted

## 2020-05-28 NOTE — Telephone Encounter (Signed)
Or auth denied, placed in providers box with highlighted reason why denied.

## 2020-05-28 NOTE — Telephone Encounter (Signed)
Called patient today and relayed the message per Dr Jacqualyn Posey. Lattie Haw

## 2020-05-29 MED ORDER — ZOLPIDEM TARTRATE ER 6.25 MG PO TBCR: 6.2500 mg | EXTENDED_RELEASE_TABLET | Freq: Every evening | ORAL | 0 refills | Status: DC | PRN

## 2020-05-29 NOTE — Telephone Encounter (Signed)
Discontinued Belsomra. Ambien CR 6.25mg  nightly as needed for sleep sent to the pharmacy. Please contact patient to let her know that the insurance won't cover Belsomra so I sent the new prescription. Hopefully this one will work. If not helping in the next couple of weeks, tell her to let me know. If she has side effects, advise to stop the medication and let me know.  Thanks, Caryl Asp

## 2020-05-29 NOTE — Addendum Note (Signed)
Addended bySamuel Bouche on: 05/29/2020 10:17 AM   Modules accepted: Orders

## 2020-05-29 NOTE — Telephone Encounter (Signed)
Patient advised.

## 2020-05-30 ENCOUNTER — Telehealth: Payer: Self-pay | Admitting: *Deleted

## 2020-05-30 NOTE — Telephone Encounter (Signed)
I informed pt of Dr. Leigh Aurora review of results and orders. Pt states she has an appt on 06/06/2020.

## 2020-05-30 NOTE — Telephone Encounter (Signed)
-----   Message from Trula Slade, DPM sent at 05/27/2020  5:12 PM EDT ----- I would like to see her A1c improved before proceeding with surgery. Please have her check her sugar daily and keep a journal of it. Please have her follow up in a few weeks and we can further discuss.  ----- Message ----- From: Wardell Heath, CMA Sent: 05/20/2020   1:21 PM EDT To: Trula Slade, DPM  Spoke with patient and advised circulation normal. She would like to know when she can schedule a surgery consult. Thanks

## 2020-06-06 ENCOUNTER — Ambulatory Visit (INDEPENDENT_AMBULATORY_CARE_PROVIDER_SITE_OTHER): Payer: Managed Care, Other (non HMO) | Admitting: Podiatry

## 2020-06-06 ENCOUNTER — Telehealth: Payer: Self-pay | Admitting: *Deleted

## 2020-06-06 ENCOUNTER — Encounter: Payer: Self-pay | Admitting: Podiatry

## 2020-06-06 ENCOUNTER — Other Ambulatory Visit: Payer: Self-pay

## 2020-06-06 VITALS — Temp 97.2°F

## 2020-06-06 DIAGNOSIS — E1149 Type 2 diabetes mellitus with other diabetic neurological complication: Secondary | ICD-10-CM | POA: Diagnosis not present

## 2020-06-06 DIAGNOSIS — M14672 Charcot's joint, left ankle and foot: Secondary | ICD-10-CM

## 2020-06-06 DIAGNOSIS — S93622S Sprain of tarsometatarsal ligament of left foot, sequela: Secondary | ICD-10-CM

## 2020-06-06 NOTE — Progress Notes (Signed)
Subjective: 54 year old female presents the office for follow-up evaluation of Lisfranc injury to the left foot.  She states that she is having swelling and pain to the foot.  She states that she needs to get back to work as soon as possible and she is very upset about this and needs to work.  She has been wearing surgical shoe. She has had an updated MRI.  Denies any systemic complaints such as fevers, chills, nausea, vomiting. No acute changes since last appointment, and no other complaints at this time.   MRI 05/17/2020 Narrative Performed At  MRI LEFT FOREFOOT WITHOUT CONTRAST, 05/17/2020 10:23 AM      INDICATION: foot trauma, Lisfranc suspected, xray done, Perisitent pain over the dorsal midfoot in spite of 3 months of immobilization, reevaluate for surgical planning \ S93.622S [name], left, sequela     COMPARISON: Left foot MRI 02/14/2020      TECHNIQUE: Multiplanar, multi-sequence surface-coil imaging of the left forefoot was performed without contrast.      FINDINGS:    Persistent increased T2 signal within the middle cuneiform, lateral cuneiform, cuboid, and proximal aspects of the second through fifth metatarsals about the Lisfranc joint. Edema slightly increased in the cuboid.    No masses or fluid collections. No acute fracture or malalignment.  First and third intermetatarsal bursitis.         Objective: AAO x3, NAD DP/PT pulses palpable bilaterally, CRT less than 3 seconds There is tenderness palpation Lisfranc joint dorsally and there is chronic edema.  There is no collapse of the foot.  Mild swelling to the ankle but no significant pain to the ankle itself.  Flexor, extensor tendons appear to be intact.  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  -I  long discussion with her in regards to her x-ray findings.  There is diastases of the first and second metatarsals with a fleck fracture.  Also MRI was reviewed with her.  Given the findings I am concerned about Charcot as she is an uncontrolled diabetic as well.  I recommend immobilization at least into a cam boot if not the [facility].  I discussed her try and stay out of work and get the A1c improved before proceeding with Lisfranc arthrodesis.  She is upset about this and states that she has to back to work.  Although I am hesitant discussed her going back to work with restrictions or to wear the cam boot and try to sit as much as possible and allow 15-minute break every 2 hours.  We did keep a close eye on this as well.  I repeated the x-rays today and ordered a new A1c. -Patient encouraged to call the office with any questions, concerns, change in symptoms.   RTC 2 weeks  Trula Slade DPM

## 2020-06-06 NOTE — Telephone Encounter (Signed)
Called and spoke with the patient today and patient needs a restriction note and I stated that I would ask Dr Jacqualyn Posey and call patient back. Lattie Haw

## 2020-06-06 NOTE — Telephone Encounter (Signed)
-----   Message from Trula Slade, DPM sent at 05/27/2020  5:12 PM EDT ----- I would like to see her A1c improved before proceeding with surgery. Please have her check her sugar daily and keep a journal of it. Please have her follow up in a few weeks and we can further discuss.  ----- Message ----- From: Wardell Heath, CMA Sent: 05/20/2020   1:21 PM EDT To: Trula Slade, DPM  Spoke with patient and advised circulation normal. She would like to know when she can schedule a surgery consult. Thanks

## 2020-06-07 LAB — HEMOGLOBIN A1C
Hgb A1c MFr Bld: 8.7 % of total Hgb — ABNORMAL HIGH (ref <5.7)
Mean Plasma Glucose: 203 (calc)
eAG (mmol/L): 11.2 (calc)

## 2020-06-10 ENCOUNTER — Telehealth: Payer: Self-pay | Admitting: Sports Medicine

## 2020-06-10 NOTE — Telephone Encounter (Signed)
New set of disability papers filled out.

## 2020-06-12 ENCOUNTER — Telehealth: Payer: Self-pay

## 2020-06-12 NOTE — Telephone Encounter (Signed)
Pt would like to speak to someone about the date listed on her paper work.

## 2020-06-13 NOTE — Telephone Encounter (Signed)
Angela- can you please assist her? Thanks! ?

## 2020-06-13 NOTE — Telephone Encounter (Signed)
Spoke with patient and patient stated that she was to get released to go back to work on 06/16/2020 not 06/09/2020 due to the patient just picked the paper work up on Wednesday and just needs a note for her records only that there was a mistake in patient going back to work on the wrong date. Helen Perez

## 2020-06-20 ENCOUNTER — Ambulatory Visit (INDEPENDENT_AMBULATORY_CARE_PROVIDER_SITE_OTHER): Payer: Managed Care, Other (non HMO)

## 2020-06-20 ENCOUNTER — Telehealth: Payer: Self-pay | Admitting: *Deleted

## 2020-06-20 ENCOUNTER — Other Ambulatory Visit: Payer: Self-pay

## 2020-06-20 DIAGNOSIS — M14672 Charcot's joint, left ankle and foot: Secondary | ICD-10-CM

## 2020-06-20 DIAGNOSIS — S93622S Sprain of tarsometatarsal ligament of left foot, sequela: Secondary | ICD-10-CM | POA: Diagnosis not present

## 2020-06-20 NOTE — Telephone Encounter (Signed)
Called patient to let them know their A1C level was 8.7 on 06/06/20.  I told patient that Dr. Jacqualyn Posey wanted her to get an x-ray done on their left foot. Patient stated "I did not know that I needed to get an x-ray done". Dr. Jacqualyn Posey had told patient on 06/06/20 that he wanted her to get an x-ray done.  I told patient that their x-ray order was in at the [facility]. Patient stated, "I can get that done today".  Patient did not say anything about going back to work yet.

## 2020-07-02 ENCOUNTER — Other Ambulatory Visit: Payer: Self-pay

## 2020-07-02 NOTE — Telephone Encounter (Signed)
Fax request for these meds. We have not written them for her before.

## 2020-07-03 ENCOUNTER — Telehealth: Payer: Self-pay

## 2020-07-03 NOTE — Telephone Encounter (Signed)
Attempted to call patient- left VM to call back

## 2020-07-03 NOTE — Telephone Encounter (Signed)
Pharmacy aware

## 2020-07-03 NOTE — Telephone Encounter (Signed)
Pt doesn't know what is getting ready to happen or what going on. Pt would like a follow up call please.

## 2020-07-04 NOTE — Telephone Encounter (Signed)
I called and went over everything. She went back to work. Some increase in swelling no increase in pain. Foot has not changed position  Helen Perez- can you make her an appt to come in Friday? Thanks.

## 2020-07-11 ENCOUNTER — Other Ambulatory Visit: Payer: Self-pay | Admitting: Podiatry

## 2020-07-11 ENCOUNTER — Ambulatory Visit (INDEPENDENT_AMBULATORY_CARE_PROVIDER_SITE_OTHER): Payer: Managed Care, Other (non HMO)

## 2020-07-11 ENCOUNTER — Other Ambulatory Visit: Payer: Self-pay

## 2020-07-11 ENCOUNTER — Ambulatory Visit (INDEPENDENT_AMBULATORY_CARE_PROVIDER_SITE_OTHER): Payer: Managed Care, Other (non HMO) | Admitting: Podiatry

## 2020-07-11 DIAGNOSIS — M14672 Charcot's joint, left ankle and foot: Secondary | ICD-10-CM

## 2020-07-11 DIAGNOSIS — S93622S Sprain of tarsometatarsal ligament of left foot, sequela: Secondary | ICD-10-CM

## 2020-07-11 DIAGNOSIS — E1149 Type 2 diabetes mellitus with other diabetic neurological complication: Secondary | ICD-10-CM

## 2020-07-14 NOTE — Progress Notes (Signed)
Subjective: 54 year old female presents the office today for follow-up evaluation of left foot Lisfranc injury.  Since I last saw her she is returned to work.  Her A1c is improving.  She states that since returning to work she has noticed some increased swelling to the foot but she is wearing a compression sock which helps. Denies any systemic complaints such as fevers, chills, nausea, vomiting. No acute changes since last appointment, and no other complaints at this time.   Objective: AAO x3, NAD DP/PT pulses palpable bilaterally, CRT less than 3 seconds She is wearing compression socks today and there is mild swelling.  Actually the swelling today appears to be better today than when I last saw her but she states that at the end of the day after working there when she has more swelling.  No significant increase in pain.  Foot is in rectus position without any collapse. No pain with calf compression, swelling, warmth, erythema  Assessment: Lisfranc injury left foot, concern for Charcot  Plan: -All treatment options discussed with the patient including all alternatives, risks, complications.  -Repeat x-rays today.  I was unable to appreciate significant change compared to prior x-rays. -Discussed bone scan possibly.  For now we will need to continue with the boot when she is not at work and then otherwise wear a stiff soled shoe.  I am also in for follow-up in our [place] office for possible bracing. -I discussed with her Lisfranc arthrodesis given the MRI findings and concern for Charcot however like to have more formation before proceeding with this. -Encouraged to continue with glucose control. -Patient encouraged to call the office with any questions, concerns, change in symptoms.   Trula Slade DPM

## 2020-07-18 ENCOUNTER — Other Ambulatory Visit: Payer: Self-pay | Admitting: Podiatry

## 2020-07-18 ENCOUNTER — Telehealth: Payer: Self-pay

## 2020-07-18 ENCOUNTER — Ambulatory Visit: Payer: Managed Care, Other (non HMO) | Admitting: Podiatry

## 2020-07-18 DIAGNOSIS — M14672 Charcot's joint, left ankle and foot: Secondary | ICD-10-CM

## 2020-07-18 DIAGNOSIS — S93622S Sprain of tarsometatarsal ligament of left foot, sequela: Secondary | ICD-10-CM

## 2020-07-18 NOTE — Telephone Encounter (Signed)
Placed order with [facility] for knee scooter

## 2020-07-22 ENCOUNTER — Other Ambulatory Visit: Payer: Self-pay | Admitting: Podiatry

## 2020-07-22 DIAGNOSIS — S93622S Sprain of tarsometatarsal ligament of left foot, sequela: Secondary | ICD-10-CM

## 2020-07-22 DIAGNOSIS — G9050 Complex regional pain syndrome I, unspecified: Secondary | ICD-10-CM

## 2020-07-22 DIAGNOSIS — M14672 Charcot's joint, left ankle and foot: Secondary | ICD-10-CM

## 2020-07-22 NOTE — Progress Notes (Signed)
Bone scan ordered and faxed

## 2020-07-23 ENCOUNTER — Telehealth: Payer: Self-pay | Admitting: Podiatry

## 2020-07-23 ENCOUNTER — Other Ambulatory Visit: Payer: Self-pay | Admitting: Podiatry

## 2020-07-23 DIAGNOSIS — M14672 Charcot's joint, left ankle and foot: Secondary | ICD-10-CM

## 2020-07-23 DIAGNOSIS — S93622S Sprain of tarsometatarsal ligament of left foot, sequela: Secondary | ICD-10-CM

## 2020-07-23 NOTE — Progress Notes (Signed)
[facility] was not able to do the bone scan. I ordered this for [facility].

## 2020-07-23 NOTE — Telephone Encounter (Signed)
[facility] called and said they are not able to do the type of scan requested.

## 2020-07-23 NOTE — Telephone Encounter (Signed)
Order sent to [facility]. Patient aware

## 2020-07-29 ENCOUNTER — Telehealth: Payer: Self-pay | Admitting: *Deleted

## 2020-07-29 NOTE — Telephone Encounter (Signed)
Patient called w/ her bone scan, had not heard anything and was told to call our office if not heard anything in a few days.   Informed patient that per [facility]  she will have to contact them to schedule the appointment, gave her their number. She verbalized understanding.

## 2020-07-30 ENCOUNTER — Other Ambulatory Visit: Payer: Self-pay | Admitting: Internal Medicine

## 2020-07-30 ENCOUNTER — Telehealth: Payer: Self-pay

## 2020-07-30 MED ORDER — OZEMPIC (1 MG/DOSE) 2 MG/1.5ML ~~LOC~~ SOPN: 1.0000 mg | PEN_INJECTOR | SUBCUTANEOUS | 3 refills | Status: DC

## 2020-07-30 NOTE — Telephone Encounter (Signed)
Pharmacy is requesting Ozempic but it doesn't show on medication list. Please review for fill

## 2020-07-31 NOTE — Telephone Encounter (Signed)
Patient notified

## 2020-08-01 ENCOUNTER — Ambulatory Visit: Payer: Managed Care, Other (non HMO) | Admitting: Internal Medicine

## 2020-08-01 NOTE — Progress Notes (Deleted)
Name: Helen Perez  Age/ Sex: 54 y.o., female   MRN/ DOB: [id number], [birth year]     PCP: Samuel Bouche, NP   Reason for Endocrinology Evaluation: Type 2 Diabetes Mellitus  Initial Endocrine Consultative Visit: 01/30/2020    PATIENT IDENTIFIER: Helen Perez is a 54 y.o. female with a past medical history of HTN, TIA, OSA and T2DM. The patient has followed with Endocrinology clinic since 01/30/2020 for consultative assistance with management of her diabetes.  DIABETIC HISTORY:  Helen Perez was diagnosed with T2DM in 2000. She reports intolerance to Metformin.  She has been on an insulin pump for years.  Her hemoglobin A1c has ranged from 10.1% in 12/10/2019, peaking at 15.7% in 2016.  On her initial visit to our clinic her A1c was 12.5% , she was on Ozempic and novolog through insulin pump. We did not make any changes as she  Uses the pump only periodically as well as the Decox. Chronic hx of non-compliance, and was given the option to switch to MDI vs using the pump properly , she opted to continue with the pump at the time.     Works 3 pm to 2:30 Am , goes to bed at 4:30 , wakes up at 10- 11 Am   By 04/2020 we stopped insulin pump and started MDI regimen     SUBJECTIVE:   During the last visit (04/25/2020): A1c 10.0% . Continued Ozempic , stopped pump and started MDI  Today (08/01/2020): Helen Perez is here for a follow up on diabetes  She checks her blood sugars multiple  times daily, through the dexcom. The patient has not had hypoglycemic episodes since the last clinic visit.    HOME DIABETES REGIMEN:  - Lantus 36 units once daily  - Novolog 12 units with each meal  - Continue Ozempic 1 mg weekly     Statin: Yes ACE-I/ARB: yes     CONTINUOUS GLUCOSE MONITORING RECORD INTERPRETATION    Dates of Recording: 6/26-04/25/2020  Sensor description:Dexcom  Results statistics:   CGM use % of time 57  Average and SD 265/82  Time in range    17    %  % Time Above 180 27    % Time above 250 56  % Time Below target 0       Glycemic patterns summary: hyperglycemia all along  Hyperglycemic episodes  All day and night  Hypoglycemic episodes occurred none  Overnight periods: trends down         DIABETIC COMPLICATIONS: Microvascular complications:   Neuropathy  Denies: retinopathy,  CKD  Last eye exam: Completed 2021  Macrovascular complications:   CVA  Denies: CAD, PVD   HISTORY:  Past Medical History:  Past Medical History:  Diagnosis Date  . Anxiety   . Depression   . Diabetes mellitus without complication ([place])   . H/O syphilis   . HSV-2 infection   . Hypertension associated with diabetes ([place]) 03/30/2018  . Renal insufficiency 10/13/2018  . Sleep apnea    CPAP  . Stroke [facility])    Past Surgical History:  Past Surgical History:  Procedure Laterality Date  . ABDOMINAL HYSTERECTOMY    . FOOT SURGERY Right     Social History:  reports that she quit smoking about 8 years ago. Her smoking use included cigarettes. She has never used smokeless tobacco. She reports current alcohol use. She reports that she does not use drugs. Family History:  Family History  Problem Relation Age of Onset  .  Diabetes Mother   . Heart disease Mother   . Heart failure Mother   . Diabetes Sister   . Kidney failure Sister   . Other Brother        quadraplegia  . Diabetes Maternal Grandmother   . Diabetes Brother   . Kidney failure Brother   . Diabetes Brother   . Kidney failure Father   . Colon cancer Neg Hx   . Esophageal cancer Neg Hx   . Pancreatic cancer Neg Hx   . Stomach cancer Neg Hx   . Liver disease Neg Hx      HOME MEDICATIONS: Allergies as of 08/01/2020      Reactions   Latex Itching, Swelling   Other Dermatitis   FREESTYLE LIBRE SENSOR- cellulitis, wound on skin    Adhesive [tape]    From dexcom      Medication List       Accurate as of August 01, 2020  7:52 AM. If you have any questions, ask your nurse or  doctor.        acetaminophen 650 MG CR tablet Commonly known as: TYLENOL Take 1 tablet by mouth as needed.   aspirin EC 81 MG tablet Take 1 tablet by mouth daily.   atorvastatin 40 MG tablet Commonly known as: LIPITOR Take 1 tablet by mouth daily.   Dexcom G6 Sensor Misc Inject 1 Device into the skin as directed. Every 10 days   Dexcom G6 Transmitter Misc 1 Package by Does not apply route every 3 (three) months.   fexofenadine-pseudoephedrine 180-240 MG 24 hr tablet Commonly known as: ALLEGRA-D 24 Take 1 tablet by mouth daily as needed.   furosemide 20 MG tablet Commonly known as: LASIX Take 1 tablet by mouth daily.   HYDROcodone-acetaminophen 5-325 MG tablet Commonly known as: NORCO/VICODIN Take 1 tablet by mouth every 8 (eight) hours as needed for moderate pain.   Insulin Pen Needle 29G X 5MM Misc 1 Device by Does not apply route as directed.   Lantus SoloStar 100 UNIT/ML Solostar Pen Generic drug: insulin glargine Inject 36 Units into the skin daily.   MULTIPLE VITAMINS PO Take 1 tablet by mouth daily.   NovoLOG FlexPen 100 UNIT/ML FlexPen Generic drug: insulin aspart Inject 12 Units into the skin 3 (three) times daily with meals.   Ozempic (1 MG/DOSE) 2 MG/1.5ML Sopn Generic drug: Semaglutide (1 MG/DOSE) Inject 1 mg into the skin once a week.   pregabalin 150 MG capsule Commonly known as: LYRICA Take 150 mg by mouth 2 (two) times daily.   Skin Tac Adhesive Barrier Wipe Misc 1 applicator by Does not apply route as directed.   Trulance 3 MG Tabs Generic drug: Plecanatide TAKE 1 TABLET BY MOUTH DAILY   valACYclovir 500 MG tablet Commonly known as: VALTREX Take 1 tablet by mouth daily.   zolpidem 6.25 MG CR tablet Commonly known as: Ambien CR Take 1 tablet (6.25 mg total) by mouth at bedtime as needed for sleep.        OBJECTIVE:   Vital Signs: There were no vitals taken for this visit.  Wt Readings from Last 3 Encounters:  05/06/20 217 lb  6.4 oz (98.6 kg)  04/25/20 215 lb 6.4 oz (97.7 kg)  03/20/20 208 lb 1.6 oz (94.4 kg)     Exam: General: Pt appears well and is in NAD  Lungs: Clear with good BS bilat with no rales, rhonchi, or wheezes  Heart: RRR   Abdomen: Normoactive bowel sounds, soft, nontender  Extremities: No pretibial edema. Left foot boot  Neuro: MS is good with appropriate affect, pt is alert and Ox3    DM foot exam: 01/30/2020  The skin of the feet is intact without sores or ulcerations. The pedal pulses are 2+ on right and 2+ on left. The sensation is decreased  to a screening 5.07, 10 gram monofilament bilaterally    DATA REVIEWED:  Lab Results  Component Value Date   HGBA1C 8.7 (H) 06/06/2020   HGBA1C 10.0 (A) 04/25/2020   HGBA1C 12.5 (A) 01/30/2020   Lab Results  Component Value Date   MICROALBUR 38.5 (H) 01/30/2020   LDLCALC 56 01/08/2020   CREATININE 1.07 (H) 01/08/2020   Lab Results  Component Value Date   MICRALBCREAT 59.1 (H) 01/30/2020     Lab Results  Component Value Date   CHOL 134 01/08/2020   HDL 60 01/08/2020   LDLCALC 56 01/08/2020   TRIG 96 01/08/2020   CHOLHDL 2.2 01/08/2020         ASSESSMENT / PLAN / RECOMMENDATIONS:   1) Type 2 Diabetes Mellitus, Poorly controlled, With neuropathic and macrovascular  complications - Most recent A1c of 10.0 %. Goal A1c < 7.0 %.    - Pt continues with hyperglycemia and intermittent use of the pump , pt agreed to switch to MDI regimen instead of the insulin pumps.  - Not a candidate for SGLT-2 inhibitors at this time, due to recurrent genital infections at this time, wil consider once hyperglycemia improves.  - will make the following changes     MEDICATIONS: - Start Lantus 36 units once daily  - Novolog 12 units with each meal  - Continue Ozempic 1 mg weekly    EDUCATION / INSTRUCTIONS:  BG monitoring instructions: Patient is instructed to check her blood sugars 4 times a day, before meals and bedtime .  Call  [place] [facility] if: BG persistently < 70  . I reviewed the Rule of 15 for the treatment of hypoglycemia in detail with the patient. Literature supplied.    2) Diabetic complications:   Eye: Does not have known diabetic retinopathy.   Neuro/ Feet: Does have known diabetic peripheral neuropathy .   Renal: Patient does not have known baseline CKD. She   is  on an ACEI/ARB at present.      F/U in 3 months    Signed electronically by: Mack Guise, MD  [facility]  [address] [place], [place] [address] Phone: [phone] FAX: [phone]   CC: Samuel Bouche, [address] [place] [address] Phone: [phone]  Fax: [phone]  Return to Endocrinology clinic as below: Future Appointments  Date Time Provider [place]  08/01/2020  2:40 PM Yehya Brendle, Melanie Crazier, MD [facility] None  08/04/2020  9:00 AM [facility] 1 [facility]  08/04/2020 12:00 PM MC-NM 3 [facility]NM [facility]  08/07/2020  9:10 AM Samuel Bouche, NP [facility] None

## 2020-08-04 ENCOUNTER — Other Ambulatory Visit: Payer: Self-pay

## 2020-08-04 ENCOUNTER — Encounter (HOSPITAL_COMMUNITY)
Admission: RE | Admit: 2020-08-04 | Discharge: 2020-08-04 | Disposition: A | Discharge status: Home / Self Care | Payer: Managed Care, Other (non HMO) | Source: Ambulatory Visit | Attending: Podiatry | Admitting: Podiatry

## 2020-08-04 DIAGNOSIS — S93622S Sprain of tarsometatarsal ligament of left foot, sequela: Secondary | ICD-10-CM | POA: Diagnosis present

## 2020-08-04 DIAGNOSIS — M14672 Charcot's joint, left ankle and foot: Secondary | ICD-10-CM

## 2020-08-04 MED ORDER — TECHNETIUM TC 99M MEDRONATE IV KIT
20.0000 | PACK | Freq: Once | INTRAVENOUS | Status: AC | PRN
  Administered 2020-08-04: 20 via INTRAVENOUS

## 2020-08-05 ENCOUNTER — Telehealth: Payer: Self-pay | Admitting: Podiatry

## 2020-08-05 NOTE — Telephone Encounter (Signed)
Reviewed the bone scan and also spoke with the reading radiologist. There was update along the lisfranc joint, mostly laterally. He was able to compare it to the MR that was done at [facility] and on his reading there was edema in the cuboid/4th metatarsal with minimal joint space narrowing. There is no fluid in the TMT. He did not see any radiological signs of Charcot.  I called the patient to discuss. Her leg is hurting from wearing the boot and throwing her hip off. I am going to try to put her into a shoe with a graphite insert and we will attempt a steroid injection to the 4th metatarsal cuboid joint. She is going to try to wear a regular, stiff shoe around the house to see how she does.  Message sent to schedulers to have her come in.

## 2020-08-06 NOTE — Progress Notes (Signed)
Subjective:    CC: chronic disease follow up  HPI: Pleasant 54 year old female presenting for follow up of chronic conditions as below:  1. Gastroesophageal reflux disease without esophagitis Usually okay without treatment. Has been worse over the last couple of days but not sure why.   2. Chronic renal failure, stage 3a ([place]) Stable as of last labs. No difficulty with urination and no changes in output. Denies shortness of breath. Lower extremity swelling consistent with baseline.  3. Dyslipidemia Prescribed Lipitor 40mg  daily but has not been taking for the last 3-4 months. States she ran out and they weren't refilled.   4. Constipation due to outlet dysfunction Previously prescribedTrulance 3mg  daily, not taking. Feels like she is ok without the medication and reports she is not having a problem with constipation at this time.  5. Hypertension associated with diabetes ([place]) BP at goal today without medication. Denies CP, SOB, palpitations.  6. Bilateral lower extremity edema Requesting Lasix 20mg  daily, treated with this in the past. Continues to have BLE edema despite elevation, sodium restriction.  7. Insomnia, unspecified type Ambien 6.25 CR not helpful. Continues to wake up and averaging only about 4 hours of sleep each night. Not wearing her CPAP due to not having the correct mask that is latex free. Wants to get back to wearing it but will need a latex free mask. Interested in new supply company if possible.   8. Sinus symptoms Has had sinus congestion with post-nasal drip and intermittent cough for approximately 2 weeks. Endorses bilateral ear pressure, facial pressure, and dental pain when bending over. Denies fever, chills, chest congestion, shortness of breath, and GI symptoms.   9. Vaginal symptoms Frequent vaginal yeast infections related to diabetes control issues. Symptoms of vaginal itching and thick white discharge started in the last 1-2 weeks.  10. Genital  herpes Taking Valtrex 500mg  daily for suppressive therapy. No recent outbreaks.  I reviewed the past medical history, family history, social history, surgical history, and allergies today and no changes were needed.  Please see the problem list section below in epic for further details.  Past Medical History: Past Medical History:  Diagnosis Date  . Anxiety   . Depression   . Diabetes mellitus without complication ([place])   . H/O syphilis   . HSV-2 infection   . Hypertension associated with diabetes ([place]) 03/30/2018  . Renal insufficiency 10/13/2018  . Sleep apnea    CPAP  . Stroke [facility])    Past Surgical History: Past Surgical History:  Procedure Laterality Date  . ABDOMINAL HYSTERECTOMY    . FOOT SURGERY Right    Social History: Social History   Socioeconomic History  . Marital status: Single    Spouse name: Not on file  . Number of children: 1  . Years of education: Not on file  . Highest education level: Not on file  Occupational History  . Not on file  Tobacco Use  . Smoking status: Former Smoker    Types: Cigarettes    Quit date: 07/27/2012    Years since quitting: 8.0  . Smokeless tobacco: Never Used  Vaping Use  . Vaping Use: Never used  Substance and Sexual Activity  . Alcohol use: Yes    Comment: 2-3 drinks/month, wine liquor  . Drug use: No  . Sexual activity: Yes    Partners: Male    Birth control/protection: Surgical  Other Topics Concern  . Not on file  Social History Narrative  . Not on file  Social Determinants of Health   Financial Resource Strain:   . Difficulty of Paying Living Expenses: Not on file  Food Insecurity:   . Worried About Charity fundraiser in the Last Year: Not on file  . Ran Out of Food in the Last Year: Not on file  Transportation Needs:   . Lack of Transportation (Medical): Not on file  . Lack of Transportation (Non-Medical): Not on file  Physical Activity:   . Days of Exercise per Week: Not on file  . Minutes of  Exercise per Session: Not on file  Stress:   . Feeling of Stress : Not on file  Social Connections:   . Frequency of Communication with Friends and Family: Not on file  . Frequency of Social Gatherings with Friends and Family: Not on file  . Attends Religious Services: Not on file  . Active Member of Clubs or Organizations: Not on file  . Attends Archivist Meetings: Not on file  . Marital Status: Not on file   Family History: Family History  Problem Relation Age of Onset  . Diabetes Mother   . Heart disease Mother   . Heart failure Mother   . Diabetes Sister   . Kidney failure Sister   . Other Brother        quadraplegia  . Diabetes Maternal Grandmother   . Diabetes Brother   . Kidney failure Brother   . Diabetes Brother   . Kidney failure Father   . Colon cancer Neg Hx   . Esophageal cancer Neg Hx   . Pancreatic cancer Neg Hx   . Stomach cancer Neg Hx   . Liver disease Neg Hx    Allergies: Allergies  Allergen Reactions  . Latex Itching and Swelling  . Other Dermatitis    FREESTYLE LIBRE SENSOR- cellulitis, wound on skin   . Adhesive [Tape]     From dexcom   Medications: See med rec.  Review of Systems: See HPI for pertinent positives and negatives.   Objective:    General: Well Developed, well nourished, and in no acute distress.  Neuro: Alert and oriented x3.  HEENT: Normocephalic, atraumatic.  Skin: Warm and dry. Cardiac: Regular rate and rhythm, no murmurs rubs or gallops, no lower extremity edema.  Respiratory: Clear to auscultation bilaterally. Not using accessory muscles, speaking in full sentences.   Impression and Recommendations:    1. Gastroesophageal reflux disease without esophagitis Stable with dietary management. If desired, may consider OTC acid suppressants on a prn basis. If symptoms continue to worsen, may need to consider prescription medication for a short period.   2. Chronic renal failure, stage 3a ([facility]) Checking CMP  today.   3. Dyslipidemia Restart Atorvastatin 40mg  daily. Refills provided.   4. Constipation due to outlet dysfunction Trulance 3mg  daily prn constipation. If not needed, okay to not fill at this time.  - Plecanatide (TRULANCE) 3 MG TABS; Take 1 tablet by mouth daily as needed.  Dispense: 30 tablet; Refill: 1  5. Hypertension associated with diabetes ([place]) At goal without medication. Checking CBC and CMP today.  - CBC - COMPLETE METABOLIC PANEL WITH GFR  6. Bilateral lower extremity edema Checking labs. Restart Furosemide 20mg  PO daily. Continue conservative measures. - CBC - COMPLETE METABOLIC PANEL WITH GFR  7. Insomnia, unspecified type Failed several sleep aids so far. We will try Ambien CR 12.5mg  to see if this is effective. Strongly recommend resuming use of CPAP. Will reach out to local DME  companies to see if they have latex free masks available.  8. Need for hepatitis C screening test Discussed screening recommendations. She is agreeable so we are adding this to blood work today. - Hepatitis C antibody  9. Need for influenza vaccination Flu vaccine given in office. - Flu Vaccine QUAD 36+ mos IM  10. Acute non-recurrent sinusitis, unspecified location Augmentin BID x 7 days. Recommend daily oral antihistamine and saline nasal spray prn.   11. Vulvovaginal candidiasis Diflucan 150mg  x 1. Refills provided as she does get this frequently in the setting of uncontrolled diabetes.   12. Genital herpes simplex, unspecified site Resume Valtrex 500mg  daily for suppression. Refills provided.   Return in about 3 months (around 11/07/2020) for chronic disease follow up . ___________________________________________ Clearnce Sorrel, DNP, APRN, FNP-BC [facility] [place]

## 2020-08-07 ENCOUNTER — Encounter: Payer: Self-pay | Admitting: Medical-Surgical

## 2020-08-07 ENCOUNTER — Ambulatory Visit (INDEPENDENT_AMBULATORY_CARE_PROVIDER_SITE_OTHER): Payer: Managed Care, Other (non HMO) | Admitting: Medical-Surgical

## 2020-08-07 VITALS — BP 126/83 | HR 108 | Temp 97.9°F | Ht 67.0 in | Wt 218.1 lb

## 2020-08-07 DIAGNOSIS — K5902 Outlet dysfunction constipation: Secondary | ICD-10-CM | POA: Diagnosis not present

## 2020-08-07 DIAGNOSIS — Z23 Encounter for immunization: Secondary | ICD-10-CM

## 2020-08-07 DIAGNOSIS — B373 Candidiasis of vulva and vagina: Secondary | ICD-10-CM

## 2020-08-07 DIAGNOSIS — E785 Hyperlipidemia, unspecified: Secondary | ICD-10-CM | POA: Diagnosis not present

## 2020-08-07 DIAGNOSIS — A6000 Herpesviral infection of urogenital system, unspecified: Secondary | ICD-10-CM

## 2020-08-07 DIAGNOSIS — N1831 Chronic kidney disease, stage 3a: Secondary | ICD-10-CM

## 2020-08-07 DIAGNOSIS — K219 Gastro-esophageal reflux disease without esophagitis: Secondary | ICD-10-CM | POA: Diagnosis not present

## 2020-08-07 DIAGNOSIS — R600 Localized edema: Secondary | ICD-10-CM

## 2020-08-07 DIAGNOSIS — Z1159 Encounter for screening for other viral diseases: Secondary | ICD-10-CM

## 2020-08-07 DIAGNOSIS — G4700 Insomnia, unspecified: Secondary | ICD-10-CM

## 2020-08-07 DIAGNOSIS — B3731 Acute candidiasis of vulva and vagina: Secondary | ICD-10-CM

## 2020-08-07 DIAGNOSIS — I152 Hypertension secondary to endocrine disorders: Secondary | ICD-10-CM

## 2020-08-07 DIAGNOSIS — E1159 Type 2 diabetes mellitus with other circulatory complications: Secondary | ICD-10-CM

## 2020-08-07 DIAGNOSIS — J019 Acute sinusitis, unspecified: Secondary | ICD-10-CM

## 2020-08-07 MED ORDER — AMOXICILLIN-POT CLAVULANATE 875-125 MG PO TABS
1.0000 | ORAL_TABLET | Freq: Two times a day (BID) | ORAL | 0 refills | Status: DC
Start: 2020-08-07 — End: 2020-09-24

## 2020-08-07 MED ORDER — ATORVASTATIN CALCIUM 40 MG PO TABS: 40.0000 mg | ORAL_TABLET | Freq: Every day | ORAL | 3 refills | Status: DC

## 2020-08-07 MED ORDER — PREGABALIN 150 MG PO CAPS: 150.0000 mg | ORAL_CAPSULE | Freq: Two times a day (BID) | ORAL | 3 refills | Status: DC

## 2020-08-07 MED ORDER — SEMAGLUTIDE-WEIGHT MANAGEMENT 1.7 MG/0.75ML ~~LOC~~ SOAJ: 1.7000 mg | SUBCUTANEOUS | 0 refills | Status: DC

## 2020-08-07 MED ORDER — TRULANCE 3 MG PO TABS: 1.0000 | ORAL_TABLET | Freq: Every day | ORAL | 1 refills | Status: DC | PRN

## 2020-08-07 MED ORDER — VALACYCLOVIR HCL 500 MG PO TABS: 500.0000 mg | ORAL_TABLET | Freq: Every day | ORAL | 3 refills | Status: DC

## 2020-08-07 MED ORDER — ZOLPIDEM TARTRATE ER 12.5 MG PO TBCR: 12.5000 mg | EXTENDED_RELEASE_TABLET | Freq: Every evening | ORAL | 0 refills | Status: DC | PRN

## 2020-08-07 MED ORDER — FLUCONAZOLE 150 MG PO TABS: 150.0000 mg | ORAL_TABLET | Freq: Once | ORAL | 5 refills | Status: AC

## 2020-08-07 MED ORDER — FUROSEMIDE 20 MG PO TABS: 20.0000 mg | ORAL_TABLET | Freq: Every day | ORAL | 3 refills | Status: DC

## 2020-08-07 NOTE — Patient Instructions (Signed)

## 2020-08-08 ENCOUNTER — Telehealth: Payer: Self-pay

## 2020-08-08 NOTE — Telephone Encounter (Signed)
Pt is retuning missed call. Please advise

## 2020-08-08 NOTE — Telephone Encounter (Signed)
I believe this was to get her scheduled for an appointment.

## 2020-08-08 NOTE — Telephone Encounter (Signed)
I got patient schedule for Friday 08/15/20 at 12 pm [place].

## 2020-08-11 LAB — COMPLETE METABOLIC PANEL WITH GFR
AG Ratio: 1 (calc) (ref 1.0–2.5)
ALT: 24 U/L (ref 6–29)
AST: 17 U/L (ref 10–35)
Albumin: 3.9 g/dL (ref 3.6–5.1)
Alkaline phosphatase (APISO): 128 U/L (ref 37–153)
BUN/Creatinine Ratio: 16 (calc) (ref 6–22)
BUN: 19 mg/dL (ref 7–25)
CO2: 28 mmol/L (ref 20–32)
Calcium: 9.5 mg/dL (ref 8.6–10.4)
Chloride: 101 mmol/L (ref 98–110)
Creat: 1.2 mg/dL — ABNORMAL HIGH (ref 0.50–1.05)
GFR, Est African American: 59 mL/min/{1.73_m2} — ABNORMAL LOW (ref >=60)
GFR, Est Non African American: 51 mL/min/{1.73_m2} — ABNORMAL LOW (ref >=60)
Globulin: 3.9 g/dL (calc) — ABNORMAL HIGH (ref 1.9–3.7)
Glucose, Bld: 254 mg/dL — ABNORMAL HIGH (ref 65–99)
Potassium: 4.8 mmol/L (ref 3.5–5.3)
Sodium: 136 mmol/L (ref 135–146)
Total Bilirubin: 0.4 mg/dL (ref 0.2–1.2)
Total Protein: 7.8 g/dL (ref 6.1–8.1)

## 2020-08-11 LAB — CBC
HCT: 38.7 % (ref 35.0–45.0)
Hemoglobin: 12.3 g/dL (ref 11.7–15.5)
MCH: 26.6 pg — ABNORMAL LOW (ref 27.0–33.0)
MCHC: 31.8 g/dL — ABNORMAL LOW (ref 32.0–36.0)
MCV: 83.6 fL (ref 80.0–100.0)
MPV: 10.5 fL (ref 7.5–12.5)
Platelets: 383 10*3/uL (ref 140–400)
RBC: 4.63 10*6/uL (ref 3.80–5.10)
RDW: 13.2 % (ref 11.0–15.0)
WBC: 8.4 10*3/uL (ref 3.8–10.8)

## 2020-08-11 LAB — HEPATITIS C ANTIBODY
Hepatitis C Ab: NONREACTIVE
SIGNAL TO CUT-OFF: 0.01 (ref <1.00)

## 2020-08-14 ENCOUNTER — Encounter: Payer: Self-pay | Admitting: Internal Medicine

## 2020-08-14 ENCOUNTER — Other Ambulatory Visit: Payer: Self-pay

## 2020-08-14 ENCOUNTER — Ambulatory Visit (INDEPENDENT_AMBULATORY_CARE_PROVIDER_SITE_OTHER): Payer: Managed Care, Other (non HMO) | Admitting: Internal Medicine

## 2020-08-14 VITALS — BP 134/82 | HR 82 | Ht 67.0 in | Wt 214.0 lb

## 2020-08-14 DIAGNOSIS — E1159 Type 2 diabetes mellitus with other circulatory complications: Secondary | ICD-10-CM

## 2020-08-14 DIAGNOSIS — E1165 Type 2 diabetes mellitus with hyperglycemia: Secondary | ICD-10-CM

## 2020-08-14 DIAGNOSIS — E1142 Type 2 diabetes mellitus with diabetic polyneuropathy: Secondary | ICD-10-CM

## 2020-08-14 DIAGNOSIS — Z794 Long term (current) use of insulin: Secondary | ICD-10-CM | POA: Diagnosis not present

## 2020-08-14 LAB — POCT GLYCOSYLATED HEMOGLOBIN (HGB A1C): Hemoglobin A1C: 8 % — AB (ref 4.0–5.6)

## 2020-08-14 NOTE — Progress Notes (Addendum)
Name: Helen Perez  Age/ Sex: 54 y.o., female   MRN/ DOB: [id number], 03/01/1966     PCP: Samuel Bouche, NP   Reason for Perez Evaluation: Type 2 Diabetes Mellitus  Initial Endocrine Consultative Visit: 01/30/2020    PATIENT IDENTIFIER: [name] is a 54 y.o. female with a past medical history of HTN, TIA, OSA and T2DM. The patient has followed with [facility] since 01/30/2020 for consultative assistance with management of her diabetes.  DIABETIC HISTORY:  [name] was diagnosed with T2DM in 2000. She reports intolerance to Metformin.  She has been on an insulin pump for years.  Her hemoglobin A1c has ranged from 10.1% in 12/10/2019, peaking at 15.7% in 2016.  On her initial visit to our clinic her A1c was 12.5% , she was on Ozempic and novolog through insulin pump. We did not make any changes as she  Uses the pump only periodically as well as the Decox. Chronic hx of non-compliance, and was given the option to switch to MDI vs using the pump properly , she opted to continue with the pump at the time.     Works 3 pm to 2:30 Am , goes to bed at 4:30 , wakes up at 10- 11 Am   By 04/2020 we stopped insulin pump and started MDI regimen     SUBJECTIVE:   During the last visit (04/25/2020): A1c 10.0% . Continued Ozempic , stopped pump and started MDI  Today (08/14/2020): Helen Perez is here for a follow up on diabetes  She checks her blood sugars multiple  times daily, through the dexcom. The patient has not had hypoglycemic episodes since the last clinic visit.   Has a left foot boot , due to unhealed fracture.  Starting steroid injections tomorrow     HOME DIABETES REGIMEN:  - Lantus 36 units once daily  - Novolog 12 units with each meal  - Continue Ozempic 1 mg weekly     Statin: Yes ACE-I/ARB: yes     CONTINUOUS GLUCOSE MONITORING RECORD INTERPRETATION    Dates of Recording: 10/15-10/28/2021  Sensor description:Dexcom  Results statistics:    CGM use % of time 71%  Average and SD 191/58  Time in range   48 %  % Time Above 180 38  % Time above 250 13  % Time Below target 0       Glycemic patterns summary: BG's optimal overnight with slight hyperglycemia during snack time   Hyperglycemic episodes  postprandial  Hypoglycemic episodes occurred none  Overnight periods: trends down         DIABETIC COMPLICATIONS: Microvascular complications:   Neuropathy  Denies: retinopathy,  CKD  Last eye exam: Completed 2021  Macrovascular complications:   CVA  Denies: CAD, PVD   HISTORY:  Past Medical History:  Past Medical History:  Diagnosis Date  . Anxiety   . Depression   . Diabetes mellitus without complication ([place])   . H/O syphilis   . HSV-2 infection   . Hypertension associated with diabetes ([place]) 03/30/2018  . Renal insufficiency 10/13/2018  . Sleep apnea    CPAP  . Stroke [facility])    Past Surgical History:  Past Surgical History:  Procedure Laterality Date  . ABDOMINAL HYSTERECTOMY    . FOOT SURGERY Right     Social History:  reports that she quit smoking about 8 years ago. Her smoking use included cigarettes. She has never used smokeless tobacco. She reports current alcohol use. She reports that she  does not use drugs. Family History:  Family History  Problem Relation Age of Onset  . Diabetes Mother   . Heart disease Mother   . Heart failure Mother   . Diabetes Sister   . Kidney failure Sister   . Other Brother        quadraplegia  . Diabetes Maternal Grandmother   . Diabetes Brother   . Kidney failure Brother   . Diabetes Brother   . Kidney failure Father   . Colon cancer Neg Hx   . Esophageal cancer Neg Hx   . Pancreatic cancer Neg Hx   . Stomach cancer Neg Hx   . Liver disease Neg Hx      HOME MEDICATIONS: Allergies as of 08/14/2020      Reactions   Latex Itching, Swelling   Other Dermatitis   FREESTYLE LIBRE SENSOR- cellulitis, wound on skin    Adhesive [tape]     From dexcom      Medication List       Accurate as of August 14, 2020 12:13 PM. If you have any questions, ask your nurse or doctor.        acetaminophen 650 MG CR tablet Commonly known as: TYLENOL Take 1 tablet by mouth as needed.   amoxicillin-clavulanate 875-125 MG tablet Commonly known as: Augmentin Take 1 tablet by mouth 2 (two) times daily.   aspirin EC 81 MG tablet Take 1 tablet by mouth daily.   atorvastatin 40 MG tablet Commonly known as: LIPITOR Take 1 tablet (40 mg total) by mouth daily.   Dexcom G6 Sensor Misc Inject 1 Device into the skin as directed. Every 10 days   Dexcom G6 Transmitter Misc 1 Package by Does not apply route every 3 (three) months.   fexofenadine-pseudoephedrine 180-240 MG 24 hr tablet Commonly known as: ALLEGRA-D 24 Take 1 tablet by mouth daily as needed.   furosemide 20 MG tablet Commonly known as: LASIX Take 1 tablet (20 mg total) by mouth daily.   Insulin Pen Needle 29G X 5MM Misc 1 Device by Does not apply route as directed.   Lantus SoloStar 100 UNIT/ML Solostar Pen Generic drug: insulin glargine Inject 36 Units into the skin daily.   MULTIPLE VITAMINS PO Take 1 tablet by mouth daily.   NovoLOG FlexPen 100 UNIT/ML FlexPen Generic drug: insulin aspart Inject 12 Units into the skin 3 (three) times daily with meals.   pregabalin 150 MG capsule Commonly known as: LYRICA Take 1 capsule (150 mg total) by mouth 2 (two) times daily.   Semaglutide-Weight Management 1.7 MG/0.75ML Soaj Inject 1.7 mg into the skin once a week.   Skin Tac Adhesive Barrier Wipe Misc 1 applicator by Does not apply route as directed.   Trulance 3 MG Tabs Generic drug: Plecanatide Take 1 tablet by mouth daily as needed.   valACYclovir 500 MG tablet Commonly known as: VALTREX Take 1 tablet (500 mg total) by mouth daily.   zolpidem 12.5 MG CR tablet Commonly known as: Ambien CR Take 1 tablet (12.5 mg total) by mouth at bedtime as needed for  sleep.        OBJECTIVE:   Vital Signs: BP 134/82   Pulse 82   Ht 5\' 7"  (1.702 m)   Wt 214 lb (97.1 kg)   SpO2 98%   BMI 33.52 kg/m   Wt Readings from Last 3 Encounters:  08/14/20 214 lb (97.1 kg)  08/07/20 218 lb 1.6 oz (98.9 kg)  05/06/20 217 lb 6.4 oz (  98.6 kg)     Exam: General: Pt appears well and is in NAD  Lungs: Clear with good BS bilat with no rales, rhonchi, or wheezes  Heart: RRR   Abdomen: Normoactive bowel sounds, soft, nontender  Extremities: No pretibial edema. Left foot boot  Neuro: MS is good with appropriate affect, pt is alert and Ox3    DM foot exam: 01/30/2020  The skin of the feet is intact without sores or ulcerations. The pedal pulses are 2+ on right and 2+ on left. The sensation is decreased  to a screening 5.07, 10 gram monofilament bilaterally    DATA REVIEWED:  Lab Results  Component Value Date   HGBA1C 8.0 (A) 08/14/2020   HGBA1C 8.7 (H) 06/06/2020   HGBA1C 10.0 (A) 04/25/2020   Lab Results  Component Value Date   MICROALBUR 38.5 (H) 01/30/2020   LDLCALC 56 01/08/2020   CREATININE 1.20 (H) 08/07/2020   Lab Results  Component Value Date   MICRALBCREAT 59.1 (H) 01/30/2020     Lab Results  Component Value Date   CHOL 134 01/08/2020   HDL 60 01/08/2020   LDLCALC 56 01/08/2020   TRIG 96 01/08/2020   CHOLHDL 2.2 01/08/2020         ASSESSMENT / PLAN / RECOMMENDATIONS:   1) Type 2 Diabetes Mellitus, with improving glucose controlled, With neuropathic and macrovascular  complications - Most recent A1c of 8.0 %. Goal A1c < 7.0 %.     -A1c down from 10.0% -She has done so well with being off the insulin and just using an MDI insulin regimen.  I have praised the patient on improved glycemic control and medication adherence. -In review of her CGM download today episodes of hypoglycemia has been noted before suppertime, this is attributed to snacking.  Were not going to change her insulin regimen today as her mealtime BG seem  to trend nicely.  In the meantime she needs to work on low-carb or no carb snacks if possible. - Not a candidate for SGLT-2 inhibitors at this time, due to recurrent genital infections at this time, wil consider once hyperglycemia improves.  -She did not qualify for wegovy, hence will continue on Ozempic  -She is going to start foot steroid injections per patient.  I am going to give her an insulin regimen to be used while using these injections    MEDICATIONS: -Continue Lantus 36 units once daily  -Continue Novolog 12 units with each meal  - Continue Ozempic 1 mg weekly    On steroids Lantus 40 units daily NovoLog 16 units with each meal Ozempic 1 mg weekly  EDUCATION / INSTRUCTIONS:  BG monitoring instructions: Patient is instructed to check her blood sugars 4 times a day, before meals and bedtime .  Call [facility] clinic if: BG persistently < 70  . I reviewed the Rule of 15 for the treatment of hypoglycemia in detail with the patient. Literature supplied.    2) Diabetic complications:   Eye: Does not have known diabetic retinopathy.   Neuro/ Feet: Does have known diabetic peripheral neuropathy .   Renal: Patient does not have known baseline CKD. She   is  on an ACEI/ARB at present.      F/U in 3 months    Signed electronically by: Mack Guise, MD  [facility]  [address] [place], [place] [address] Phone: [phone] FAX: [phone]   CC: Samuel Bouche, [address] [place] [address] Phone: [phone]  Fax: [phone]  Return to Perez clinic as below: Future Appointments  Date Time Provider [place]  08/15/2020 12:00 PM Trula Slade, DPM [facility] None  11/07/2020  1:20 PM Samuel Bouche, NP [facility] None  11/17/2020 10:50 AM Marline Morace, Melanie Crazier, MD [facility] None

## 2020-08-14 NOTE — Patient Instructions (Addendum)
OFF STEROIDS: -  Continue Lantus 36 units once daily  - Novolog 12 units with each meal  - Continue Ozempic 1 mg weekly      ON STEROIDS: Lantus 40 units daily  Novolog 16 units with each meal  Continue Ozempic 1 mg weekly   Choose healthy, lower carb lower calorie snacks: toss salad, vegetables, cottage cheese, peanut butter, low fat cheese / string cheese, lower sodium deli meat, tuna salad or chicken salad    HOW TO TREAT LOW BLOOD SUGARS (Blood sugar LESS THAN 70 MG/DL)  Please follow the RULE OF 15 for the treatment of hypoglycemia treatment (when your (blood sugars are less than 70 mg/dL)    STEP 1: Take 15 grams of carbohydrates when your blood sugar is low, which includes:   3-4 GLUCOSE TABS  OR  3-4 OZ OF JUICE OR REGULAR SODA OR  ONE TUBE OF GLUCOSE GEL     STEP 2: RECHECK blood sugar in 15 MINUTES STEP 3: If your blood sugar is still low at the 15 minute recheck --> then, go back to STEP 1 and treat AGAIN with another 15 grams of carbohydrates.

## 2020-08-15 ENCOUNTER — Ambulatory Visit (INDEPENDENT_AMBULATORY_CARE_PROVIDER_SITE_OTHER): Payer: Managed Care, Other (non HMO) | Admitting: Podiatry

## 2020-08-15 DIAGNOSIS — M79672 Pain in left foot: Secondary | ICD-10-CM | POA: Diagnosis not present

## 2020-08-15 DIAGNOSIS — M779 Enthesopathy, unspecified: Secondary | ICD-10-CM | POA: Diagnosis not present

## 2020-08-15 DIAGNOSIS — E1149 Type 2 diabetes mellitus with other diabetic neurological complication: Secondary | ICD-10-CM | POA: Diagnosis not present

## 2020-08-15 DIAGNOSIS — S93622S Sprain of tarsometatarsal ligament of left foot, sequela: Secondary | ICD-10-CM

## 2020-08-15 NOTE — Progress Notes (Signed)
Subjective: 54 year old female presents the office today for Evaluation of left foot Lisfranc injury.  After last saw her she had a bone scan and I discussed with radiologist who reviewed the MRI, bone scan as well as x-rays.  Not seen the neuropathic arthropathy changes but likely given Lisfranc injury.  She presents today for further discussion and evaluation.  I did discuss with her previously over the phone the results of the bone scan.  She states the swelling of the foot pain is actually improved since the last saw her. Denies any systemic complaints such as fevers, chills, nausea, vomiting. No acute changes since last appointment, and no other complaints at this time.   Objective: AAO x3, NAD DP/PT pulses palpable bilaterally, CRT less than 3 seconds Presents today walking in the cam boot.  There is improved edema to the foot, mild edema is present.  There is no erythema warmth compared to contralateral extremity normal skin texture.  Mild discomfort on the arch of the foot she reports but also to the dorsal aspect.  She does get discomfort along the lateral aspect of the Lisfranc joint.  Flexor, extensor tendons appear to be intact.  MMT 5/5. No pain with calf compression, swelling, warmth, erythema  Assessment: Capsulitis, Lisfranc injury  Plan: -All treatment options discussed with the patient including all alternatives, risks, complications.  -At this point she is been back to work on her feet more.  Swelling is improved.  I cannot see a significant changes to the bone on x-ray not thinking this is Ch[place]ot at this point.  I offered steroid injection as a trial and she elects to proceed with this.  I prepped the skin with alcohol and a mixture of 1 cc Kenalog 10, 0.5 cc of Marcaine plain, 0.5 cc of lidocaine plain was infiltrated on the fourth metatarsal cuboid joint.  Postinjection care was discussed.  She tolerated well. -I dispensed a graphite insert for her to wear inside of her regular  shoes.  Discussed with her gradual transition back to regular shoe.  Over the next appointment we can try to measure her for orthotics.  Trula Slade DPM

## 2020-08-25 ENCOUNTER — Telehealth: Payer: Self-pay

## 2020-08-25 NOTE — Telephone Encounter (Signed)
Did she start a daily antihistamine like Zyrtec or Claritin? Has she been using Flonase or another nasal steroid every day? The Augmentin should have taken care of her infection. It could be sinus congestion from allergies at this point. If she has not been doing those things, would recommend she do both the oral antihistamine and the nasal spray for at least 7 days. If this helps with her symptoms, continue both of those. If not, we will have to reevaluate.

## 2020-08-25 NOTE — Telephone Encounter (Signed)
Pt called and said that she finished the Augmentin given to her on 08/07/2020 for sinusitis, but her sx have now returned. She is requesting a refill to be sent to her pharmacy. I told her that I would send this message to [facility] and see if she would be willing to send something in for her, but she may need to schedule an appt. Joy, please advise

## 2020-08-26 NOTE — Telephone Encounter (Signed)
Pt aware of Joy's response. She states she has been taking the oral antihistamine and using the nasal steroid spray but the sx are still present. I told her that since she has been using those and the sx are still present that Joy recommends she come back in for re-evaluation. Pt said "thank you, I will call back in the morning" and then disconnected the call.

## 2020-09-06 ENCOUNTER — Other Ambulatory Visit: Payer: Self-pay | Admitting: Medical-Surgical

## 2020-09-24 ENCOUNTER — Ambulatory Visit (INDEPENDENT_AMBULATORY_CARE_PROVIDER_SITE_OTHER): Payer: Managed Care, Other (non HMO) | Admitting: Medical-Surgical

## 2020-09-24 ENCOUNTER — Encounter: Payer: Self-pay | Admitting: Medical-Surgical

## 2020-09-24 ENCOUNTER — Other Ambulatory Visit: Payer: Self-pay

## 2020-09-24 VITALS — BP 148/84 | HR 117 | Temp 97.7°F | Ht 67.0 in | Wt 213.8 lb

## 2020-09-24 DIAGNOSIS — F32A Depression, unspecified: Secondary | ICD-10-CM

## 2020-09-24 DIAGNOSIS — F419 Anxiety disorder, unspecified: Secondary | ICD-10-CM | POA: Diagnosis not present

## 2020-09-24 DIAGNOSIS — J329 Chronic sinusitis, unspecified: Secondary | ICD-10-CM

## 2020-09-24 MED ORDER — DOXYCYCLINE HYCLATE 100 MG PO TABS: ORAL_TABLET | ORAL | 0 refills | Status: DC

## 2020-09-24 MED ORDER — AMITRIPTYLINE HCL 25 MG PO TABS: 25.0000 mg | ORAL_TABLET | Freq: Every day | ORAL | 0 refills | Status: DC

## 2020-09-24 MED ORDER — FLUCONAZOLE 200 MG PO TABS: 200.0000 mg | ORAL_TABLET | ORAL | 0 refills | Status: DC

## 2020-09-24 NOTE — Progress Notes (Signed)
Subjective:    CC: sinus issues, mood  HPI: Pleasant 54 year old female presenting today for the following:  Sinus issues- has had continuous sinus congestion with infections treated with antibiotics without full resolution. Last treatment was in October. Notes some improvement with the Augmentin but symptoms did not go away completely. Since then she has been trying to manage the sinus congestion/drainage with Claritin and Flonase but these are not helping. Now she is have sore throat with PND. Feels as if the secretions are thick and stuck in the back of her nose/throat. She coughs up thick mucus, especially after waking and as the sinus congestion begins to break up. Has also noted an increase in acne on her face and feels this is related to her current infection. Denies fever, chills, chest congestion. Has had some blurred vision but thinks this may be related to allergies. Has tried Nyquil to help sleep at night. Has not seen ENT before.   Mood- recently completed an evaluation with her insurance company and they noted that her depression scores were very high on the PHQ/GAD7. Notes that she has been increasingly depressed and frustrated over the past months. A lot of her feelings are related to her health struggles and the reduction in her quality of life and normally enjoyed activities that she can no longer do. Still having trouble sleeping at night even with Ambien CR 12.5mg . She has tried medication for depression in the past but is not sure what it was. Is open to both medication and counseling.   I reviewed the past medical history, family history, social history, surgical history, and allergies today and no changes were needed.  Please see the problem list section below in epic for further details.  Past Medical History: Past Medical History:  Diagnosis Date  . Anxiety   . Depression   . Diabetes mellitus without complication ([place])   . H/O syphilis   . HSV-2 infection   .  Hypertension associated with diabetes ([place]) 03/30/2018  . Renal insufficiency 10/13/2018  . Sleep apnea    CPAP  . Stroke [facility])    Past Surgical History: Past Surgical History:  Procedure Laterality Date  . ABDOMINAL HYSTERECTOMY    . FOOT SURGERY Right    Social History: Social History   Socioeconomic History  . Marital status: Single    Spouse name: Not on file  . Number of children: 1  . Years of education: Not on file  . Highest education level: Not on file  Occupational History  . Not on file  Tobacco Use  . Smoking status: Former Smoker    Types: Cigarettes    Quit date: 07/27/2012    Years since quitting: 8.1  . Smokeless tobacco: Never Used  Vaping Use  . Vaping Use: Never used  Substance and Sexual Activity  . Alcohol use: Yes    Comment: 2-3 drinks/month, wine liquor  . Drug use: No  . Sexual activity: Yes    Partners: Male    Birth control/protection: Surgical  Other Topics Concern  . Not on file  Social History Narrative  . Not on file   Social Determinants of Health   Financial Resource Strain:   . Difficulty of Paying Living Expenses: Not on file  Food Insecurity:   . Worried About Charity fundraiser in the Last Year: Not on file  . Ran Out of Food in the Last Year: Not on file  Transportation Needs:   . Lack of Transportation (Medical):  Not on file  . Lack of Transportation (Non-Medical): Not on file  Physical Activity:   . Days of Exercise per Week: Not on file  . Minutes of Exercise per Session: Not on file  Stress:   . Feeling of Stress : Not on file  Social Connections:   . Frequency of Communication with Friends and Family: Not on file  . Frequency of Social Gatherings with Friends and Family: Not on file  . Attends Religious Services: Not on file  . Active Member of Clubs or Organizations: Not on file  . Attends Archivist Meetings: Not on file  . Marital Status: Not on file   Family History: Family History  Problem  Relation Age of Onset  . Diabetes Mother   . Heart disease Mother   . Heart failure Mother   . Diabetes Sister   . Kidney failure Sister   . Other Brother        quadraplegia  . Diabetes Maternal Grandmother   . Diabetes Brother   . Kidney failure Brother   . Diabetes Brother   . Kidney failure Father   . Colon cancer Neg Hx   . Esophageal cancer Neg Hx   . Pancreatic cancer Neg Hx   . Stomach cancer Neg Hx   . Liver disease Neg Hx    Allergies: Allergies  Allergen Reactions  . Latex Itching and Swelling  . Other Dermatitis    FREESTYLE LIBRE SENSOR- cellulitis, wound on skin   . Adhesive [Tape]     From dexcom   Medications: See med rec.  Review of Systems: See HPI for pertinent positives and negatives.   Objective:    General: Well Developed, well nourished, and in no acute distress.  Neuro: Alert and oriented x3.  HEENT: Normocephalic, atraumatic, pupils equal round reactive to light, neck supple, + cervical lymphadenopathy with tenderness. Tenderness to palpation of frontal and maxillary sinuses. Bilateral TMs intact, bony landmarks present, no bulging or erythema noted. External ear canals have small amounts of white to light yellow crusty discharge visualized bilaterally.  Skin: Warm and dry. Facial acne present.  Cardiac: Regular rate and rhythm, no murmurs rubs or gallops, no lower extremity edema.  Respiratory: Clear to auscultation bilaterally. Not using accessory muscles, speaking in full sentences.   Impression and Recommendations:    1. Chronic sinusitis, unspecified location History of poor or incomplete response to antibiotic therapy in the setting of sinus infection with uncontrolled diabetes. Treating with Doxycycline 200mg  on day 1 followed by 100mg  daily on days 2-21. Continue Claritin and Flonase. Consider adding Mucinex. Increase PO fluids. Referring to ENT for further evaluation.  - Ambulatory referral to ENT  2. Anxiety/depression Discussed  treatment options. Considered Effexor as she does have the occasional hot flashes but her difficulty sleeping even with high dose Ambien is still a concern. Starting Amitriptyline 25mg  nightly at bedtime. Referring to behavioral health for therapy. Ultimately, once her health concerns become more controlled and she gets some relief from the most pressing of her issues (namely her foot pain/swelling), she should see an improvement in her overall mood and quality of life.  - Ambulatory referral to [facility]  Return if symptoms worsen or fail to improve. Already has an appointment scheduled with me.  ___________________________________________ Clearnce Sorrel, DNP, APRN, FNP-BC [facility] [place]

## 2020-10-03 ENCOUNTER — Ambulatory Visit (INDEPENDENT_AMBULATORY_CARE_PROVIDER_SITE_OTHER): Payer: Managed Care, Other (non HMO) | Admitting: Podiatry

## 2020-10-03 ENCOUNTER — Other Ambulatory Visit: Payer: Self-pay

## 2020-10-03 ENCOUNTER — Ambulatory Visit (INDEPENDENT_AMBULATORY_CARE_PROVIDER_SITE_OTHER): Payer: Managed Care, Other (non HMO)

## 2020-10-03 DIAGNOSIS — B351 Tinea unguium: Secondary | ICD-10-CM | POA: Diagnosis not present

## 2020-10-03 DIAGNOSIS — E1149 Type 2 diabetes mellitus with other diabetic neurological complication: Secondary | ICD-10-CM

## 2020-10-03 DIAGNOSIS — M779 Enthesopathy, unspecified: Secondary | ICD-10-CM

## 2020-10-03 DIAGNOSIS — S93622S Sprain of tarsometatarsal ligament of left foot, sequela: Secondary | ICD-10-CM

## 2020-10-03 DIAGNOSIS — S92254A Nondisplaced fracture of navicular [scaphoid] of right foot, initial encounter for closed fracture: Secondary | ICD-10-CM

## 2020-10-03 MED ORDER — NONFORMULARY OR COMPOUNDED ITEM
3 refills | Status: DC
Start: 2020-10-03 — End: 2022-10-12

## 2020-10-03 MED ORDER — DEXAMETHASONE SODIUM PHOSPHATE 120 MG/30ML IJ SOLN
4.0000 mg | Freq: Once | INTRAMUSCULAR | Status: AC
  Administered 2020-10-03: 4 mg via INTRA_ARTICULAR

## 2020-10-03 NOTE — Progress Notes (Signed)
Subjective: 54 year old female presents the office today for follow-up evaluation of left foot pain.  She said the right foot is also starting to hurt as well but denies any recent injury or falls.  The injection that we did last appointment was helpful and her foot was doing well until the pain started to recur last week.  She still has swelling to the foot.  She is wearing regular shoes today.  The graphite inserts also been helpful.  She also has concerns of toenail fungus asking about options for this. Denies any systemic complaints such as fevers, chills, nausea, vomiting. No acute changes since last appointment, and no other complaints at this time.   Objective: AAO x3, NAD DP/PT pulses palpable bilaterally, CRT less than 3 seconds Major discomfort in the left foot is along the fourth metatarsal cuboid joint.  There is chronic edema present to the foot.  There is no erythema.  Mild discomfort the dorsal aspect of the right midfoot with the dorsal central aspect of foot mostly along metatarsal cuneiform joint.  There is no area of pinpoint tenderness.  Trace edema on the right foot.  MMT 5/5. Nails appear to be hypertrophic and dystrophic with yellow-brown discoloration of surrounding the right hallux toenail.  There is no pain in the nail there is no redness or drainage or any signs of infection. No pain with calf compression, swelling, warmth, erythema  Assessment: Capsulitis bilaterally, history of left foot Lisfranc injury; onychomycosis  Plan: -All treatment options discussed with the patient including all alternatives, risks, complications.  -In regards to fungus options she was proceed with topical medication.  I ordered a compound cream today through [place].   -Repeat x-rays revealed left foot as well as x-rays of the right foot.  There does appear to be some arthritic changes present Lisfranc on the left side compared to prior x-rays.  On the right foot no obvious signs of  acute fracture noted.  Small ossicle in the dorsal talonavicular joint.  Awaiting radiology report. -Steroid injection performed bilaterally.  Skin was prepped with alcohol and a mixture of half cc dexamethasone phosphate and half cc Marcaine plain was infiltrated on the left foot along the fourth metatarsal cuboid joint on the right foot along the dorsal aspect of the Lisfranc joint.  Postinjection care was discussed. -For now continue with graphite insert.  I will discuss the case with the group.  I discussed with her surgical intervention on the left side for arthrodesis of the Lisfranc joint however I am concerned about fusion of the fourth metatarsal cuboid joint.  Trula Slade DPM

## 2020-10-03 NOTE — Patient Instructions (Signed)
I have ordered a medication for you that will come from [facility] in [place]. They should be calling you to verify insurance and will mail the medication to you. If you live close by then you can go by their pharmacy to pick up the medication. Their phone number is [phone]. If you do not hear from them in the next few days, please give us a call at [phone].   

## 2020-10-13 DIAGNOSIS — R0982 Postnasal drip: Secondary | ICD-10-CM | POA: Insufficient documentation

## 2020-10-13 DIAGNOSIS — J0120 Acute ethmoidal sinusitis, unspecified: Secondary | ICD-10-CM | POA: Insufficient documentation

## 2020-10-13 DIAGNOSIS — R0683 Snoring: Secondary | ICD-10-CM | POA: Insufficient documentation

## 2020-10-22 ENCOUNTER — Other Ambulatory Visit: Payer: Self-pay | Admitting: Medical-Surgical

## 2020-10-23 ENCOUNTER — Ambulatory Visit: Payer: 59 | Admitting: Professional

## 2020-11-07 ENCOUNTER — Encounter: Payer: Self-pay | Admitting: Medical-Surgical

## 2020-11-07 ENCOUNTER — Telehealth: Payer: Self-pay | Admitting: Medical-Surgical

## 2020-11-07 ENCOUNTER — Other Ambulatory Visit: Payer: Self-pay

## 2020-11-07 ENCOUNTER — Ambulatory Visit (INDEPENDENT_AMBULATORY_CARE_PROVIDER_SITE_OTHER): Payer: Managed Care, Other (non HMO) | Admitting: Medical-Surgical

## 2020-11-07 VITALS — BP 144/83 | HR 113 | Temp 97.9°F | Ht 67.0 in | Wt 216.4 lb

## 2020-11-07 DIAGNOSIS — F32A Depression, unspecified: Secondary | ICD-10-CM | POA: Diagnosis not present

## 2020-11-07 DIAGNOSIS — S93622S Sprain of tarsometatarsal ligament of left foot, sequela: Secondary | ICD-10-CM

## 2020-11-07 DIAGNOSIS — J329 Chronic sinusitis, unspecified: Secondary | ICD-10-CM | POA: Diagnosis not present

## 2020-11-07 DIAGNOSIS — F419 Anxiety disorder, unspecified: Secondary | ICD-10-CM | POA: Diagnosis not present

## 2020-11-07 DIAGNOSIS — K5902 Outlet dysfunction constipation: Secondary | ICD-10-CM | POA: Diagnosis not present

## 2020-11-07 DIAGNOSIS — G4700 Insomnia, unspecified: Secondary | ICD-10-CM

## 2020-11-07 DIAGNOSIS — E785 Hyperlipidemia, unspecified: Secondary | ICD-10-CM

## 2020-11-07 DIAGNOSIS — R600 Localized edema: Secondary | ICD-10-CM

## 2020-11-07 MED ORDER — FUROSEMIDE 20 MG PO TABS: 20.0000 mg | ORAL_TABLET | Freq: Every day | ORAL | 3 refills | Status: DC

## 2020-11-07 MED ORDER — TRULANCE 3 MG PO TABS
1.0000 | ORAL_TABLET | Freq: Every day | ORAL | 3 refills | Status: DC | PRN
Start: 2020-11-07 — End: 2021-10-27

## 2020-11-07 MED ORDER — AMITRIPTYLINE HCL 25 MG PO TABS: 25.0000 mg | ORAL_TABLET | Freq: Every day | ORAL | 1 refills | Status: DC

## 2020-11-07 MED ORDER — TRIAMCINOLONE ACETONIDE 55 MCG/ACT NA AERO: 2.0000 | INHALATION_SPRAY | Freq: Every day | NASAL | 12 refills | Status: DC

## 2020-11-07 NOTE — Progress Notes (Signed)
Subjective:    CC: chronic condition follow up  HPI: Pleasant 55 year old female presenting for follow up of the following:  Anxiety/depression- Feels that she is doing very well since starting the Amitriptyline 25mg  nightly. Feels quite mellow most of time and doesn't get angry and upset like she did before.   HLD-  Doing well on Atorvastatin, tolerating well without side effects.   Lower extremity edema- continues to have swelling of the left foot and lower leg on most days. Does occasionally have swelling on the right too. Better with elevation, worse after standing for long periods. Has compression socks but has a hard time wearing them every day. Taking furosemide 20mg  daily but this doesn't help as well at times.   Constipation- taking Trulance daily, tolerating well. Notes that it does help some with the constipation.   Insomnia- sleeping better than ever since starting Amitriptyline. Gets an average of 8-9 hours of sleep each day and feels well rested.   Foot pain- has been seeing podiatry but doesn't feel like this is helping her. Is interested in seeing a different podiatrist for a second opinion.   Chronic sinusitis- saw ENT and had a big bill from the imaging they wanted to do. They told her to use saline nasal spray which she did. Unfortunately, she developed a reaction to the saline spray and had to stop using it. Noted that the spray they used in the office to clear up her sinuses for the exam was very helpful and wishes she could get some of that.   I reviewed the past medical history, family history, social history, surgical history, and allergies today and no changes were needed.  Please see the problem list section below in epic for further details.  Past Medical History: Past Medical History:  Diagnosis Date  . Anxiety   . Depression   . Diabetes mellitus without complication ([place])   . H/O syphilis   . HSV-2 infection   . Hypertension associated with diabetes  ([place]) 03/30/2018  . Renal insufficiency 10/13/2018  . Sleep apnea    CPAP  . Stroke [facility])    Past Surgical History: Past Surgical History:  Procedure Laterality Date  . ABDOMINAL HYSTERECTOMY    . FOOT SURGERY Right    Social History: Social History   Socioeconomic History  . Marital status: Single    Spouse name: Not on file  . Number of children: 1  . Years of education: Not on file  . Highest education level: Not on file  Occupational History  . Not on file  Tobacco Use  . Smoking status: Former Smoker    Types: Cigarettes    Quit date: 07/27/2012    Years since quitting: 8.2  . Smokeless tobacco: Never Used  Vaping Use  . Vaping Use: Never used  Substance and Sexual Activity  . Alcohol use: Yes    Comment: 2-3 drinks/month, wine liquor  . Drug use: No  . Sexual activity: Yes    Partners: Male    Birth control/protection: Surgical  Other Topics Concern  . Not on file  Social History Narrative  . Not on file   Social Determinants of Health   Financial Resource Strain: Not on file  Food Insecurity: Not on file  Transportation Needs: Not on file  Physical Activity: Not on file  Stress: Not on file  Social Connections: Not on file   Family History: Family History  Problem Relation Age of Onset  . Diabetes Mother   .  Heart disease Mother   . Heart failure Mother   . Diabetes Sister   . Kidney failure Sister   . Other Brother        quadraplegia  . Diabetes Maternal Grandmother   . Diabetes Brother   . Kidney failure Brother   . Diabetes Brother   . Kidney failure Father   . Colon cancer Neg Hx   . Esophageal cancer Neg Hx   . Pancreatic cancer Neg Hx   . Stomach cancer Neg Hx   . Liver disease Neg Hx    Allergies: Allergies  Allergen Reactions  . Latex Itching and Swelling  . Other Dermatitis    FREESTYLE LIBRE SENSOR- cellulitis, wound on skin   . Adhesive [Tape]     From dexcom   Medications: See med rec.  Review of Systems: See HPI  for pertinent positives and negatives.   Objective:    General: Well Developed, well nourished, and in no acute distress.  Neuro: Alert and oriented x3.  HEENT: Normocephalic, atraumatic.  Skin: Warm and dry. Cardiac: Regular rate and rhythm, no murmurs rubs or gallops, no lower extremity edema.  Respiratory: Clear to auscultation bilaterally. Not using accessory muscles, speaking in full sentences.  Impression and Recommendations:    1. Constipation due to outlet dysfunction Continue Trulance daily, refills provided.  - Plecanatide (TRULANCE) 3 MG TABS; Take 1 tablet by mouth daily as needed.  Dispense: 90 tablet; Refill: 3  2. Anxiety/depression Stable. Continue Amitriptyline 25mg  at bedtime.   3. Chronic sinusitis, unspecified location Start triamcinolone nasal spray 2 sprays each nostril daily.   4. Dyslipidemia Continue Atorvastatin 40mg  daily.   5. Insomnia, unspecified type Well managed with Amitriptyline.   6. Bilateral lower extremity edema Continue furosemide 20mg  daily. Ok to take an additional 20mg  tablet daily no more than 3 times weekly. Recommend wearing compression socks daily. Consider looking into leggings that provide compression.   7. [name], left, sequela Referral to podiatry for second opinion at patient request.   Return in about 3 months (around 02/05/2021) for chronic disease follow up. ___________________________________________ Clearnce Sorrel, DNP, APRN, FNP-BC [facility]

## 2020-11-10 ENCOUNTER — Telehealth: Payer: Self-pay | Admitting: Podiatry

## 2020-11-13 ENCOUNTER — Other Ambulatory Visit: Payer: Self-pay

## 2020-11-17 ENCOUNTER — Other Ambulatory Visit: Payer: Self-pay

## 2020-11-17 ENCOUNTER — Ambulatory Visit (INDEPENDENT_AMBULATORY_CARE_PROVIDER_SITE_OTHER): Payer: Managed Care, Other (non HMO) | Admitting: Internal Medicine

## 2020-11-17 ENCOUNTER — Encounter: Payer: Self-pay | Admitting: Internal Medicine

## 2020-11-17 VITALS — BP 140/82 | HR 108 | Ht 67.0 in | Wt 215.0 lb

## 2020-11-17 DIAGNOSIS — Z794 Long term (current) use of insulin: Secondary | ICD-10-CM | POA: Diagnosis not present

## 2020-11-17 DIAGNOSIS — E1165 Type 2 diabetes mellitus with hyperglycemia: Secondary | ICD-10-CM

## 2020-11-17 DIAGNOSIS — Z6833 Body mass index (BMI) 33.0-33.9, adult: Secondary | ICD-10-CM

## 2020-11-17 LAB — POCT GLYCOSYLATED HEMOGLOBIN (HGB A1C): Hemoglobin A1C: 9.5 % — AB (ref 4.0–5.6)

## 2020-11-17 MED ORDER — DEXCOM G6 RECEIVER DEVI: 0 refills | Status: DC

## 2020-11-17 MED ORDER — DAPAGLIFLOZIN PROPANEDIOL 5 MG PO TABS: 5.0000 mg | ORAL_TABLET | Freq: Every day | ORAL | 6 refills | Status: DC

## 2020-11-17 NOTE — Progress Notes (Signed)
Name: Terrian Libbey  Age/ Sex: 55 y.o., female   MRN/ DOB: [id number], 1965/11/03     PCP: Samuel Bouche, NP   Reason for Endocrinology Evaluation: Type 2 Diabetes Mellitus  Initial Endocrine Consultative Visit: 01/30/2020    PATIENT IDENTIFIER: [name] is a 55 y.o. female with a past medical history of HTN, TIA, OSA and T2DM. The patient has followed with Endocrinology clinic since 01/30/2020 for consultative assistance with management of her diabetes.  DIABETIC HISTORY:  Ms. Caudle was diagnosed with T2DM in 2000. She reports intolerance to Metformin.  She has been on an insulin pump for years.  Her hemoglobin A1c has ranged from 10.1% in 12/10/2019, peaking at 15.7% in 2016.  On her initial visit to our clinic her A1c was 12.5% , she was on Ozempic and novolog through insulin pump. We did not make any changes as she  Uses the pump only periodically as well as the Decox. Chronic hx of non-compliance, and was given the option to switch to MDI vs using the pump properly , she opted to continue with the pump at the time.     Works 3 pm to 2:30 Am , goes to bed at 4:30 , wakes up at 10- 11 Am   By 04/2020 we stopped insulin pump and started MDI regimen     SUBJECTIVE:   During the last visit (08/14/2020): A1c 8.0% . Continued Ozempic , MDI regimen     Today (11/18/2020): Ms. Arkwright is here for a follow up on diabetes  She checks her blood sugars multiple  times daily, through the dexcom. The patient has not had hypoglycemic episodes since the last clinic visit.  Denies nausea or diarrhea    Has a left  unhealed fracture,  planning on sx  Has recurrent genital infections Last genital infection was 2 weeks ago   HOME DIABETES REGIMEN:  - Lantus 36 units once daily  - Novolog 12 units with each meal  - Continue Ozempic 1 mg weekly     Statin: Yes ACE-I/ARB: yes     CONTINUOUS GLUCOSE MONITORING RECORD INTERPRETATION    Dates of Recording: 1/18-1/31/2022  Sensor  description:Dexcom  Results statistics:   CGM use % of time 79%  Average and SD 213/63  Time in range   29 %  % Time Above 180 42  % Time above 250 28  % Time Below target <1       Glycemic patterns summary: BG's optimal overnight with slight hyperglycemia during snack time   Hyperglycemic episodes  postprandial  Hypoglycemic episodes occurred none  Overnight periods: trends down         DIABETIC COMPLICATIONS: Microvascular complications:  Neuropathy Denies: retinopathy,  CKD Last eye exam: Completed 2021  Macrovascular complications:  CVA Denies: CAD, PVD   HISTORY:  Past Medical History:  Past Medical History:  Diagnosis Date  . Anxiety   . Depression   . Diabetes mellitus without complication ([place])   . H/O syphilis   . HSV-2 infection   . Hypertension associated with diabetes ([place]) 03/30/2018  . Renal insufficiency 10/13/2018  . Sleep apnea    CPAP  . Stroke [facility])    Past Surgical History:  Past Surgical History:  Procedure Laterality Date  . ABDOMINAL HYSTERECTOMY    . FOOT SURGERY Right    Social History:  reports that she quit smoking about 8 years ago. Her smoking use included cigarettes. She has never used smokeless tobacco. She reports current alcohol  use. She reports that she does not use drugs. Family History:  Family History  Problem Relation Age of Onset  . Diabetes Mother   . Heart disease Mother   . Heart failure Mother   . Diabetes Sister   . Kidney failure Sister   . Other Brother        quadraplegia  . Diabetes Maternal Grandmother   . Diabetes Brother   . Kidney failure Brother   . Diabetes Brother   . Kidney failure Father   . Colon cancer Neg Hx   . Esophageal cancer Neg Hx   . Pancreatic cancer Neg Hx   . Stomach cancer Neg Hx   . Liver disease Neg Hx      HOME MEDICATIONS: Allergies as of 11/17/2020      Reactions   Latex Itching, Swelling   Other Dermatitis   FREESTYLE LIBRE SENSOR- cellulitis, wound  on skin    Adhesive [tape]    From dexcom      Medication List       Accurate as of November 17, 2020 11:59 PM. If you have any questions, ask your nurse or doctor.        acetaminophen 650 MG CR tablet Commonly known as: TYLENOL Take 1 tablet by mouth as needed.   amitriptyline 25 MG tablet Commonly known as: ELAVIL Take 1 tablet (25 mg total) by mouth at bedtime.   aspirin EC 81 MG tablet Take 1 tablet by mouth daily.   atorvastatin 40 MG tablet Commonly known as: LIPITOR Take 1 tablet (40 mg total) by mouth daily.   dapagliflozin propanediol 5 MG Tabs tablet Commonly known as: Farxiga Take 1 tablet (5 mg total) by mouth daily before breakfast. Started by: Dorita Sciara, MD   Dexcom G6 Receiver Devi Use instructed to check blood sugar daily Started by: Dorita Sciara, MD   Dexcom G6 Sensor Misc Inject 1 Device into the skin as directed. Every 10 days   Dexcom G6 Transmitter Misc 1 Package by Does not apply route every 3 (three) months.   fexofenadine-pseudoephedrine 180-240 MG 24 hr tablet Commonly known as: ALLEGRA-D 24 Take 1 tablet by mouth daily as needed.   fluconazole 200 MG tablet Commonly known as: DIFLUCAN Take 1 tablet (200 mg total) by mouth once a week.   furosemide 20 MG tablet Commonly known as: LASIX Take 1 tablet (20 mg total) by mouth daily. If significant swelling that is not responding to elevation and compression, take 1 extra tablet daily no more than 3 times weekly.   Insulin Pen Needle 29G X 5MM Misc 1 Device by Does not apply route as directed.   Lantus SoloStar 100 UNIT/ML Solostar Pen Generic drug: insulin glargine Inject 36 Units into the skin daily.   mupirocin ointment 2 % Commonly known as: BACTROBAN Apply 1 application topically. 2-3 times daily   NONFORMULARY OR COMPOUNDED ITEM Antifungal solution: Terbinafine 3%, Fluconazole 2%, Tea Tree Oil 5%, Urea 10%, Ibuprofen 2% in DMSO suspension #73mL   NovoLOG  FlexPen 100 UNIT/ML FlexPen Generic drug: insulin aspart Inject 12 Units into the skin 3 (three) times daily with meals.   pregabalin 150 MG capsule Commonly known as: LYRICA Take 1 capsule (150 mg total) by mouth 2 (two) times daily.   Skin Tac Adhesive Barrier Wipe Misc 1 applicator by Does not apply route as directed.   triamcinolone 55 MCG/ACT Aero nasal inhaler Commonly known as: NASACORT Place 2 sprays into the nose daily.  Trulance 3 MG Tabs Generic drug: Plecanatide Take 1 tablet by mouth daily as needed.   valACYclovir 500 MG tablet Commonly known as: VALTREX Take 1 tablet (500 mg total) by mouth daily.        OBJECTIVE:   Vital Signs: BP 140/82   Pulse (!) 108   Ht 5\' 7"  (1.702 m)   Wt 215 lb (97.5 kg)   SpO2 97%   BMI 33.67 kg/m   Wt Readings from Last 3 Encounters:  11/17/20 215 lb (97.5 kg)  11/07/20 216 lb 6.4 oz (98.2 kg)  09/24/20 213 lb 12.8 oz (97 kg)     Exam: General: Pt appears well and is in NAD  Lungs: Clear with good BS bilat with no rales, rhonchi, or wheezes  Heart: RRR   Abdomen: Normoactive bowel sounds, soft, nontender  Extremities: No pretibial edema.  Neuro: MS is good with appropriate affect, pt is alert and Ox3    DM foot exam: 01/30/2020  The skin of the feet is intact without sores or ulcerations. The pedal pulses are 2+ on right and 2+ on left. The sensation is decreased  to a screening 5.07, 10 gram monofilament bilaterally    DATA REVIEWED:  Lab Results  Component Value Date   HGBA1C 9.5 (A) 11/17/2020   HGBA1C 8.0 (A) 08/14/2020   HGBA1C 8.7 (H) 06/06/2020   Lab Results  Component Value Date   MICROALBUR 38.5 (H) 01/30/2020   LDLCALC 56 01/08/2020   CREATININE 1.20 (H) 08/07/2020   Lab Results  Component Value Date   MICRALBCREAT 59.1 (H) 01/30/2020     Lab Results  Component Value Date   CHOL 134 01/08/2020   HDL 60 01/08/2020   LDLCALC 56 01/08/2020   TRIG 96 01/08/2020   CHOLHDL 2.2  01/08/2020         ASSESSMENT / PLAN / RECOMMENDATIONS:   1) Type 2 Diabetes Mellitus, Poorly controlled, With neuropathic and macrovascular  complications - Most recent A1c of 9.5 %. Goal A1c < 7.0 %.    - Unfortunately pt with worsening hyperglycemia, she admits to dietary indiscretions through the holidays . She is interested in more weight loss, we discussed SGLT-2 inhibitors and their risk of genital infection, she is willing to try a small dose at this time  -She did not qualify for wegovy, hence will continue on Ozempic     MEDICATIONS: -Continue Lantus 36 units once daily  -Increase  Novolog to 14  units with each meal  -Novolog 3 units with a snack  - Continue Ozempic 1 mg weekly  - Start Farxiga 5 mg daily    EDUCATION / INSTRUCTIONS: BG monitoring instructions: Patient is instructed to check her blood sugars 4 times a day, before meals and bedtime . Call [place] [facility] if: BG persistently < 70  I reviewed the Rule of 15 for the treatment of hypoglycemia in detail with the patient. Literature supplied.    2) Diabetic complications:  Eye: Does not have known diabetic retinopathy.  Neuro/ Feet: Does have known diabetic peripheral neuropathy .  Renal: Patient does not have known baseline CKD. She   is  on an ACEI/ARB at present.    3) BMI 33:   - We discussed weight loss surgery given BMI > 30 and has DM. She is interested, will refer to [place] surgery      F/U in 3 months    Signed electronically by: Mack Guise, MD  [facility]  [facility] [address], [place], [place] [address] Phone: [phone] FAX: [phone]   CC: Samuel Bouche, [place] [place] [address] Phone: [phone]  Fax: [phone]  Return to Endocrinology clinic as below: Future Appointments  Date Time Provider [place]  02/06/2021  1:20 PM Samuel Bouche, NP [facility] None   02/16/2021  9:10 AM Gerrard Crystal, Melanie Crazier, MD [facility] None

## 2020-11-17 NOTE — Patient Instructions (Addendum)
Continue Lantus 36 units daily  Increase Novolog to 14 units with each meal  Novolog 3 units with a snack  Continue Ozempic 1 mg weekly  Start Farxiga 5 mg , 1 tablet with Breakfast   - Start Probiotics     HOW TO TREAT LOW BLOOD SUGARS (Blood sugar LESS THAN 70 MG/DL)  Please follow the RULE OF 15 for the treatment of hypoglycemia treatment (when your (blood sugars are less than 70 mg/dL)    STEP 1: Take 15 grams of carbohydrates when your blood sugar is low, which includes:   3-4 GLUCOSE TABS  OR  3-4 OZ OF JUICE OR REGULAR SODA OR  ONE TUBE OF GLUCOSE GEL     STEP 2: RECHECK blood sugar in 15 MINUTES STEP 3: If your blood sugar is still low at the 15 minute recheck --> then, go back to STEP 1 and treat AGAIN with another 15 grams of carbohydrates.

## 2020-11-26 ENCOUNTER — Other Ambulatory Visit: Payer: Self-pay

## 2020-11-26 ENCOUNTER — Ambulatory Visit (INDEPENDENT_AMBULATORY_CARE_PROVIDER_SITE_OTHER): Payer: Managed Care, Other (non HMO) | Admitting: Podiatry

## 2020-11-26 ENCOUNTER — Encounter: Payer: Self-pay | Admitting: Podiatry

## 2020-11-26 DIAGNOSIS — S93622S Sprain of tarsometatarsal ligament of left foot, sequela: Secondary | ICD-10-CM

## 2020-11-26 DIAGNOSIS — B351 Tinea unguium: Secondary | ICD-10-CM | POA: Diagnosis not present

## 2020-11-27 NOTE — Progress Notes (Signed)
Subjective:   Patient ID: Helen Perez, female   DOB: 55 y.o.   MRN: [id number]   HPI Patient presents stating she has developed discoloration of her nails just recently and is also concerned about her left midfoot being sore and wanted to have it checked   ROS      Objective:  Physical Exam  Neurovascular status intact with patient found to have discoloration hallux nails bilateral localized with what appears to be trauma that occurred in moderate discomfort midfoot left     Assessment:  Probability for trauma of the nailbeds bilateral with patient found to have midfoot irritation left     Plan:  H&P reviewed condition and went ahead today and discussed the nails and she can soak them and hopefully they will recover but may take 6 months to grow out.  Patient will be seen back to recheck encouraged to call questions and recommended heat ice as far as the midfoot goes  X-rays were negative for signs of any dislocation or fracture condition

## 2020-12-02 ENCOUNTER — Telehealth: Payer: Self-pay | Admitting: Internal Medicine

## 2020-12-02 NOTE — Telephone Encounter (Signed)
Message left for patient to return my call.  

## 2020-12-02 NOTE — Telephone Encounter (Signed)
Patient called to advise that her insurance no longer covers Lantus but that they will cover the generic.  Patient is requesting an RX for that be sent to the [facility] on [address] in [place] back for patient is [phone]

## 2020-12-04 MED ORDER — TOUJEO MAX SOLOSTAR 300 UNIT/ML ~~LOC~~ SOPN: 36.0000 [IU] | PEN_INJECTOR | Freq: Every day | SUBCUTANEOUS | 3 refills | Status: DC

## 2020-12-04 NOTE — Telephone Encounter (Signed)
FYI  Patient called back then I asked for her to check with insurance to see which insulin they will cover. She stated that she does not have the time to be making phones calls and will not take any then hung up on me.

## 2020-12-04 NOTE — Addendum Note (Signed)
Addended by: Dorita Sciara on: 12/04/2020 09:43 AM   Modules accepted: Orders

## 2020-12-09 ENCOUNTER — Other Ambulatory Visit: Payer: Self-pay

## 2020-12-09 ENCOUNTER — Ambulatory Visit: Payer: Managed Care, Other (non HMO) | Admitting: Podiatry

## 2020-12-09 ENCOUNTER — Ambulatory Visit (INDEPENDENT_AMBULATORY_CARE_PROVIDER_SITE_OTHER): Payer: Managed Care, Other (non HMO) | Admitting: Podiatry

## 2020-12-09 ENCOUNTER — Encounter: Payer: Self-pay | Admitting: Podiatry

## 2020-12-09 DIAGNOSIS — M779 Enthesopathy, unspecified: Secondary | ICD-10-CM

## 2020-12-09 DIAGNOSIS — S93622S Sprain of tarsometatarsal ligament of left foot, sequela: Secondary | ICD-10-CM | POA: Diagnosis not present

## 2020-12-09 MED ORDER — TRIAMCINOLONE ACETONIDE 10 MG/ML IJ SUSP
10.0000 mg | Freq: Once | INTRAMUSCULAR | Status: AC
  Administered 2020-12-09: 10 mg

## 2020-12-14 NOTE — Progress Notes (Signed)
Subjective: 55 year old female presents the office today for Pap evaluation of bilateral foot pain left side worse than right.  Since I last saw her she saw another provider outside of our practice and she states that she was told to get a bigger size shoes.  Since doing that she noticed bruising to her toenails.  She did follow-up with Dr. Paulla Dolly as well and was told that it might take some time for her to grow out.  She does continue to discomfort to the left foot worse than right she is asking for steroid injection today.  Also presents today to possibly discuss surgical intervention.  She states her blood sugars been high and her A1c has been elevated and last check was 9.5. Denies any systemic complaints such as fevers, chills, nausea, vomiting. No acute changes since last appointment, and no other complaints at this time.   Objective: AAO x3, NAD DP/PT pulses palpable bilaterally, CRT less than 3 seconds Discomfort along the dorsal aspect the left foot and Lisfranc area mostly along the 3rd, 4th metatarsal joint articulations.  There is trace edema there is no erythema or warmth.  There is no skin breakdown.  MMT 5/5.  Some bruising present in the nails but appears to be growing out.  No edema, erythema or signs of infection of the toenail sites.  No pain with calf compression, swelling, warmth, erythema  Assessment: Chronic Lisfranc injury left side; subungual hematoma  Plan: -All treatment options discussed with the patient including all alternatives, risks, complications.  -I discussed with her surgical intervention for the left foot.  Discussed with her first, 2nd, 3rd metatarsal cuneiform joint fusions.  However am concerned because her discomfort is also in the 4th metatarsal cuneiform joint.  I would be hesitant to fuse this joint.  Discussed that even if she undergoes surgery it may not completely eliminate her discomfort.  Also discussed with her the A1c to be below 8 prior to this  surgery.  When she had the blood sugar down were going to further consider this. -Steroid injection performed.  Skin was prepped with alcohol and a mixture of 1 cc, 10, 0.5 cc of Marcaine plain, 0.5 cc of lidocaine plain was infiltrated into the area of maximal tenderness without complications.  Postinjection care discussed. -Graphite insert -Patient encouraged to call the office with any questions, concerns, change in symptoms.   Trula Slade DPM

## 2020-12-16 ENCOUNTER — Telehealth: Payer: Self-pay | Admitting: Podiatry

## 2020-12-16 ENCOUNTER — Other Ambulatory Visit: Payer: Self-pay | Admitting: Internal Medicine

## 2020-12-16 ENCOUNTER — Other Ambulatory Visit: Payer: Self-pay | Admitting: Medical-Surgical

## 2020-12-16 ENCOUNTER — Other Ambulatory Visit: Payer: Self-pay | Admitting: Podiatry

## 2020-12-16 DIAGNOSIS — M779 Enthesopathy, unspecified: Secondary | ICD-10-CM

## 2020-12-16 DIAGNOSIS — S93622S Sprain of tarsometatarsal ligament of left foot, sequela: Secondary | ICD-10-CM

## 2020-12-16 NOTE — Telephone Encounter (Signed)
Patient calling to inform Dr. Jacqualyn Posey that her foot is in severe pain, and swelling after injection given 2/22. She would like to set up an appointment ASAP in [place] or [place].

## 2020-12-16 NOTE — Progress Notes (Signed)
Patient wants to try PT before doing surgery. Order placed

## 2020-12-16 NOTE — Telephone Encounter (Signed)
Pt called requesting to speak with you in regards to her surgery. She mentioned having physical therapy and getting restrictions for work. Please advise.

## 2020-12-16 NOTE — Telephone Encounter (Signed)
Helen Perez- can you send over restrictions for her job. Stand no more than 2 hours at a time, sit as needed.   Also she wants to try PT- order placed  Offered appt this week but she only wanted to come in if she couldn't speak to me.

## 2020-12-16 NOTE — Telephone Encounter (Signed)
Patient would like you to call her to discuss possible restrictions she may need for work due to foot pain.

## 2020-12-18 ENCOUNTER — Encounter: Payer: Self-pay | Admitting: Podiatry

## 2021-01-01 ENCOUNTER — Ambulatory Visit: Payer: Self-pay

## 2021-01-01 ENCOUNTER — Other Ambulatory Visit: Payer: Self-pay | Admitting: Family Medicine

## 2021-01-01 ENCOUNTER — Other Ambulatory Visit: Payer: Self-pay

## 2021-01-01 DIAGNOSIS — M25562 Pain in left knee: Secondary | ICD-10-CM

## 2021-01-07 ENCOUNTER — Other Ambulatory Visit: Payer: Self-pay

## 2021-01-07 ENCOUNTER — Encounter: Payer: Self-pay | Admitting: Physical Therapy

## 2021-01-07 ENCOUNTER — Ambulatory Visit: Payer: Managed Care, Other (non HMO) | Attending: Podiatry | Admitting: Physical Therapy

## 2021-01-07 DIAGNOSIS — M79672 Pain in left foot: Secondary | ICD-10-CM

## 2021-01-07 DIAGNOSIS — R600 Localized edema: Secondary | ICD-10-CM | POA: Diagnosis present

## 2021-01-07 DIAGNOSIS — M25672 Stiffness of left ankle, not elsewhere classified: Secondary | ICD-10-CM | POA: Insufficient documentation

## 2021-01-07 DIAGNOSIS — R262 Difficulty in walking, not elsewhere classified: Secondary | ICD-10-CM | POA: Insufficient documentation

## 2021-01-07 NOTE — Therapy (Signed)
[facility] [address]  [place], [place], [address] Phone: [phone]   Fax:  [phone]  Physical Therapy Evaluation  Patient Details  Name: Helen Perez MRN: [id number] Date of Birth: 09/11/1966 Referring Provider (PT): Celesta Gentile, DPM   Encounter Date: 01/07/2021   PT End of Session - 01/07/21 1645    Visit Number 1    Number of Visits 13    Date for PT Re-Evaluation 02/18/21    Authorization Type [employer]    PT Start Time 1558    PT Stop Time 1636    PT Time Calculation (min) 38 min    Activity Tolerance Patient tolerated treatment well;Patient limited by pain    Behavior During Therapy [facility] for tasks assessed/performed           Past Medical History:  Diagnosis Date  . Anxiety   . Depression   . Diabetes mellitus without complication ([place])   . H/O syphilis   . HSV-2 infection   . Hypertension associated with diabetes ([place]) 03/30/2018  . Renal insufficiency 10/13/2018  . Sleep apnea    CPAP  . Stroke [facility])     Past Surgical History:  Procedure Laterality Date  . ABDOMINAL HYSTERECTOMY    . FOOT SURGERY Right     There were no vitals filed for this visit.    Subjective Assessment - 01/07/21 1559    Subjective Patient reports that for the past 11 months she has had L foot pain. Notes that she would like to avoid surgery. Reports that one day she was walking and heard something snap. Wore a walking boot for about a month, then a cast for 4 days. Notes that the cast injured her foot, so she went back to a boot until August. Since then, has had pain on the L dorsal and plantar aspect of foot. Notes N/T in her B toes. Pain worse with prolonged standing- which she has to do at work and prolonged walking. Also wears compression stockings per MD's advice.    Pertinent History stroke, HTN, DM, depression, anxiety, R foot surgery    Limitations Lifting;Standing;Walking;House hold activities    How  long can you stand comfortably? 1-2 hours    How long can you walk comfortably? 1-2 hours    Diagnostic tests 10/03/20 R foot xray: Small avulsion fracture of the dorsal navicular with overlying  soft tissue swelling.    Patient Stated Goals avoid surgery    Currently in Pain? Yes    Pain Score 2     Pain Location Foot    Pain Orientation Left    Pain Descriptors / Indicators Throbbing    Pain Type Chronic pain              [facility] PT Assessment - 01/07/21 1605      Assessment   Medical Diagnosis Capsulitis, L [name]    Referring Provider (PT) Celesta Gentile, DPM    Onset Date/Surgical Date 02/08/20    Next MD Visit 02/06/21    Prior Therapy yes      Precautions   Precautions None      Balance Screen   Has the patient fallen in the past 6 months No    Has the patient had a decrease in activity level because of a fear of falling?  No    Is the patient reluctant to leave their home because of a fear of falling?  No  [place] Private residence    Living Arrangements Parent   living with mother- sometimes cares for her   Available Help at Discharge Family    Type of [place] Access Level entry    [place] One level    [place] None      Prior Function   Level of Independence Independent    Vocation Full time employment    Vocation Requirements standing, walking, computer work    Leisure shopping      Cognition   Overall Cognitive Status Within Functional Limits for tasks assessed      Observation/Other Assessments   Observations L shoe untied d/t "swelling"; edema over dorsal midfoot; L great toe developing a bunion      Sensation   Light Touch Impaired by gross assessment   reports N/T in toes     Coordination   Gross Motor Movements are Fluid and Coordinated Yes      Posture/Postural Control   Posture/Postural Control Postural limitations    Postural Limitations Weight shift right;Weight shift  left;Rounded Shoulders      ROM / Strength   AROM / PROM / Strength Strength;AROM      AROM   AROM Assessment Site Ankle    Right/Left Ankle Right;Left    Right Ankle Dorsiflexion 1    Right Ankle Plantar Flexion 25    Right Ankle Inversion 28    Right Ankle Eversion 20    Left Ankle Dorsiflexion 0   pain   Left Ankle Plantar Flexion 25   pain   Left Ankle Inversion 8    Left Ankle Eversion 25      Strength   Strength Assessment Site Hip;Knee;Ankle    Right/Left Hip Right;Left    Right Hip Flexion 4+/5    Right Hip ABduction 4+/5    Right Hip ADduction 4+/5    Left Hip Flexion 4+/5    Left Hip ABduction 4+/5    Left Hip ADduction 4+/5    Right/Left Knee Right;Left    Right Knee Flexion 4/5    Right Knee Extension 4/5    Left Knee Flexion 4+/5    Left Knee Extension 4+/5    Right/Left Ankle Right;Left    Right Ankle Dorsiflexion 4+/5    Right Ankle Plantar Flexion 4/5   5 reps   Right Ankle Inversion 4+/5    Right Ankle Eversion 4+/5    Left Ankle Dorsiflexion 4+/5   pain   Left Ankle Plantar Flexion 3+/5   pa   Left Ankle Inversion 4/5   pain   Left Ankle Eversion 4/5   pain     Palpation   Palpation comment decreased medial plantar arch; TTP over L posterior tibialis, gastroc-soleus complex, dorsal and plantar midfoot      Ambulation/Gait   Assistive device None    Gait Pattern Step-through pattern;Decreased dorsiflexion - left;Decreased dorsiflexion - right;Decreased stance time - left;Decreased step length - right    Ambulation Surface Level;Indoor    Gait velocity decreased                      Objective measurements completed on examination: See above findings.               PT Education - 01/07/21 1644    Education Details prognosis, POC, HEP- advised to avoid pushing into pain    Person(s) Educated Patient    Methods Explanation;Demonstration;Tactile  cues;Verbal cues;Handout    Comprehension Verbalized understanding;Returned  demonstration            PT Short Term Goals - 01/07/21 1741      PT SHORT TERM GOAL #1   Title independent with initial HEP    Time 2    Period Weeks    Status New    Target Date 01/21/21             PT Long Term Goals - 01/07/21 1741      PT LONG TERM GOAL #1   Title Patient to be independent with advanced HEP.    Time 6    Period Weeks    Status New    Target Date 02/18/21      PT LONG TERM GOAL #2   Title Patient to demonstrate B LE strength >/=4+/5.    Time 6    Period Weeks    Status New    Target Date 02/18/21      PT LONG TERM GOAL #3   Title Patient to demonstrate L ankle AROM WFL and without pain limiting.    Time 6    Period Weeks    Status New    Target Date 02/18/21      PT LONG TERM GOAL #4   Title Patient to report 1 full day of work without increasing pain >2/10.    Time 6    Period Weeks    Status New    Target Date 02/18/21      PT LONG TERM GOAL #5   Title Patient to report 75% improvement in pain levels in the L foot.    Time 6    Period Weeks    Status New    Target Date 02/18/21                  Plan - 01/07/21 1645    Clinical Impression Statement Patient is a 55 y/o F presenting to OPPT with c/o L midfoot pain for the past 11 months after sustaining a L Lisfranc injury. Now out of walking boot, but reports remaining pain with prolonged walking and standing, which she has to do at work.  Notes N/T in her B toes. Patient today presenting with edema over the L dorsal midfoot with L great toe bunion, limited L>R ankle AROM, decreased R knee and B ankle strength, decreased L medial plantar arch, TTP over L posterior tibialis, gastroc-soleus complex, dorsal and plantar midfoot, and gait deviations. Patient was educated on gentle strengthening and stretching HEP- patient reported understanding. Would benefit from skilled PT services 2x/week for 6 weeks to address aforementioned impairments.    Personal Factors and Comorbidities  Age;Comorbidity 3+;Fitness;Past/Current Experience;Profession;Time since onset of injury/illness/exacerbation    Comorbidities stroke, HTN, DM, depression, anxiety, R foot surgery    Examination-Activity Limitations Squat;Stairs;Stand;Carry;Dressing;Transfers;Lift;Locomotion Level;Reach Overhead    Examination-Participation Restrictions Church;Cleaning;Shop;Community Activity;Driving;Yard Work;Laundry;Meal Prep;Occupation    Stability/Clinical Decision Making Stable/Uncomplicated    Clinical Decision Making Low    Rehab Potential Good    PT Frequency 2x / week    PT Duration 6 weeks    PT Treatment/Interventions ADLs/Self Care Home Management;Cryotherapy;Electrical Stimulation;Iontophoresis 4mg /ml Dexamethasone;Moist Heat;Balance training;Therapeutic exercise;Therapeutic activities;Functional mobility training;Stair training;Gait training;Ultrasound;Neuromuscular re-education;Patient/family education;Manual techniques;Vasopneumatic Device;Taping;Energy conservation;Dry needling;Passive range of motion    PT Next Visit Plan ankle FOTO; reassess HEP; progress AROM and strength to tolerance    Consulted and Agree with Plan of Care Patient  Patient will benefit from skilled therapeutic intervention in order to improve the following deficits and impairments:  Abnormal gait,Hypomobility,Increased edema,Decreased activity tolerance,Pain,Increased fascial restricitons,Decreased strength,Decreased balance,Difficulty walking,Increased muscle spasms,Improper body mechanics,Decreased range of motion,Impaired flexibility,Postural dysfunction  Visit Diagnosis: Pain in left foot  Stiffness of left ankle, not elsewhere classified  Difficulty in walking, not elsewhere classified  Localized edema     Problem List Patient Active Problem List   Diagnosis Date Noted  . Depression 11/07/2020  . Anxiety 11/07/2020  . Chronic sinusitis 09/24/2020  . Bilateral lower extremity edema 05/06/2020   . Benign skin mole 05/06/2020  . Insomnia 05/06/2020  . BMI 34.0-34.9,adult 05/06/2020  . [name], left, sequela 02/15/2020  . Diabetes mellitus ([place]) 01/31/2020  . Dyslipidemia 01/31/2020  . Type 2 diabetes mellitus with diabetic polyneuropathy, with long-term current use of insulin ([place]) 01/31/2020  . Chronic renal failure, stage 3a ([place]) 01/09/2020  . Constipation 01/08/2020  . DDD (degenerative disc disease), cervical 10/23/2018  . DDD (degenerative disc disease), lumbar 10/23/2018  . Renal insufficiency 10/13/2018  . OSA (obstructive sleep apnea) 06/29/2018  . Hypertension associated with diabetes ([place]) 03/30/2018  . GERD (gastroesophageal reflux disease) 09/06/2017  . Fatty liver 05/31/2017  . Anemia, chronic disease 04/11/2017  . Diabetic peripheral neuropathy associated with type 2 diabetes mellitus ([place]) 03/18/2016  . Hyperlipidemia associated with type 2 diabetes mellitus ([place]) 03/10/2016  . Type 2 diabetes mellitus with hyperglycemia, with long-term current use of insulin ([place]) 11/24/2015  . TIA (transient ischemic attack) 06/04/2015      Janene Harvey, PT, DPT 01/07/21 5:45 PM   [facility] [address]  [place], [place], [address] Phone: [phone]   Fax:  [phone]  Name: Saramarie Stinger MRN: [id number] Date of Birth: 10-May-1966

## 2021-01-12 ENCOUNTER — Ambulatory Visit: Payer: Managed Care, Other (non HMO)

## 2021-01-12 ENCOUNTER — Other Ambulatory Visit: Payer: Self-pay

## 2021-01-12 DIAGNOSIS — M25672 Stiffness of left ankle, not elsewhere classified: Secondary | ICD-10-CM

## 2021-01-12 DIAGNOSIS — M79672 Pain in left foot: Secondary | ICD-10-CM

## 2021-01-12 DIAGNOSIS — R600 Localized edema: Secondary | ICD-10-CM

## 2021-01-12 DIAGNOSIS — R262 Difficulty in walking, not elsewhere classified: Secondary | ICD-10-CM

## 2021-01-12 NOTE — Therapy (Signed)
[facility] [address]  [place], [place], [address] Phone: [phone]   Fax:  [phone]  Physical Therapy Treatment  Patient Details  Name: Gracielynn Birkel MRN: [id number] Date of Birth: 1966-03-04 Referring Provider (PT): Celesta Gentile, DPM   Encounter Date: 01/12/2021   PT End of Session - 01/12/21 1440    Visit Number 2    Number of Visits 13    Date for PT Re-Evaluation 02/18/21    Authorization Type [employer]    PT Start Time 1400    PT Stop Time 1440    PT Time Calculation (min) 40 min    Activity Tolerance Patient tolerated treatment well;Patient limited by pain    Behavior During Therapy [facility] for tasks assessed/performed           Past Medical History:  Diagnosis Date  . Anxiety   . Depression   . Diabetes mellitus without complication ([place])   . H/O syphilis   . HSV-2 infection   . Hypertension associated with diabetes ([place]) 03/30/2018  . Renal insufficiency 10/13/2018  . Sleep apnea    CPAP  . Stroke [facility])     Past Surgical History:  Procedure Laterality Date  . ABDOMINAL HYSTERECTOMY    . FOOT SURGERY Right     There were no vitals filed for this visit.   Subjective Assessment - 01/12/21 1404    Subjective Pt reports that the exercises make her foot feel better, less tight after exersices.    Pertinent History stroke, HTN, DM, depression, anxiety, R foot surgery    Diagnostic tests 10/03/20 R foot xray: Small avulsion fracture of the dorsal navicular with overlying  soft tissue swelling.    Patient Stated Goals avoid surgery    Currently in Pain? No/denies              [facility] PT Assessment - 01/12/21 0001      Observation/Other Assessments   Focus on Therapeutic Outcomes (FOTO)  Foot: 53, Predicted: 48                         [facility] Adult PT Treatment/Exercise - 01/12/21 0001      Exercises   Exercises Ankle      Ankle Exercises: Stretches   Gastroc Stretch  2 reps;30 seconds    Gastroc Stretch Limitations at counter      Ankle Exercises: Aerobic   Recumbent Bike L1x34min      Ankle Exercises: Standing   Other Standing Ankle Exercises DLS, narrow stance, 6N62 sec on airex; SLS 2x10 sec, LOB x1      Ankle Exercises: Seated   ABC's 1 rep    Other Seated Ankle Exercises 4 way ankle R tband 10 reps    Other Seated Ankle Exercises rockerboard 10 reps 4 way                    PT Short Term Goals - 01/07/21 1741      PT SHORT TERM GOAL #1   Title independent with initial HEP    Time 2    Period Weeks    Status New    Target Date 01/21/21             PT Long Term Goals - 01/07/21 1741      PT LONG TERM GOAL #1   Title Patient to be independent with advanced HEP.    Time 6  Period Weeks    Status New    Target Date 02/18/21      PT LONG TERM GOAL #2   Title Patient to demonstrate B LE strength >/=4+/5.    Time 6    Period Weeks    Status New    Target Date 02/18/21      PT LONG TERM GOAL #3   Title Patient to demonstrate L ankle AROM WFL and without pain limiting.    Time 6    Period Weeks    Status New    Target Date 02/18/21      PT LONG TERM GOAL #4   Title Patient to report 1 full day of work without increasing pain >2/10.    Time 6    Period Weeks    Status New    Target Date 02/18/21      PT LONG TERM GOAL #5   Title Patient to report 75% improvement in pain levels in the L foot.    Time 6    Period Weeks    Status New    Target Date 02/18/21                 Plan - 01/12/21 1440    Clinical Impression Statement Pt responded well to treatment, no reports of pain during session. She reported understanding of her HEP and declinded needing to review exercises. Her balance needs work, she had some LOB with SLS and DLS and noted that her balance has been affected by her foot pain.  Added PF/DF to ankle Tband exercises for overall ankle strenthening and stability. Cues were given to keep  upright posture and to shift weight through feet to keep stability.    Personal Factors and Comorbidities Age;Comorbidity 3+;Fitness;Past/Current Experience;Profession;Time since onset of injury/illness/exacerbation    Comorbidities stroke, HTN, DM, depression, anxiety, R foot surgery    PT Frequency 2x / week    PT Duration 6 weeks    PT Treatment/Interventions ADLs/Self Care Home Management;Cryotherapy;Electrical Stimulation;Iontophoresis 4mg /ml Dexamethasone;Moist Heat;Balance training;Therapeutic exercise;Therapeutic activities;Functional mobility training;Stair training;Gait training;Ultrasound;Neuromuscular re-education;Patient/family education;Manual techniques;Vasopneumatic Device;Taping;Energy conservation;Dry needling;Passive range of motion    PT Next Visit Plan progress AROM and strength to tolerance    Consulted and Agree with Plan of Care Patient           Patient will benefit from skilled therapeutic intervention in order to improve the following deficits and impairments:  Abnormal gait,Hypomobility,Increased edema,Decreased activity tolerance,Pain,Increased fascial restricitons,Decreased strength,Decreased balance,Difficulty walking,Increased muscle spasms,Improper body mechanics,Decreased range of motion,Impaired flexibility,Postural dysfunction  Visit Diagnosis: Pain in left foot  Stiffness of left ankle, not elsewhere classified  Difficulty in walking, not elsewhere classified  Localized edema     Problem List Patient Active Problem List   Diagnosis Date Noted  . Depression 11/07/2020  . Anxiety 11/07/2020  . Chronic sinusitis 09/24/2020  . Bilateral lower extremity edema 05/06/2020  . Benign skin mole 05/06/2020  . Insomnia 05/06/2020  . BMI 34.0-34.9,adult 05/06/2020  . [name], left, sequela 02/15/2020  . Diabetes mellitus ([place]) 01/31/2020  . Dyslipidemia 01/31/2020  . Type 2 diabetes mellitus with diabetic polyneuropathy, with long-term current  use of insulin ([place]) 01/31/2020  . Chronic renal failure, stage 3a ([place]) 01/09/2020  . Constipation 01/08/2020  . DDD (degenerative disc disease), cervical 10/23/2018  . DDD (degenerative disc disease), lumbar 10/23/2018  . Renal insufficiency 10/13/2018  . OSA (obstructive sleep apnea) 06/29/2018  . Hypertension associated with diabetes ([place]) 03/30/2018  . GERD (gastroesophageal reflux disease) 09/06/2017  .  Fatty liver 05/31/2017  . Anemia, chronic disease 04/11/2017  . Diabetic peripheral neuropathy associated with type 2 diabetes mellitus ([place]) 03/18/2016  . Hyperlipidemia associated with type 2 diabetes mellitus ([place]) 03/10/2016  . Type 2 diabetes mellitus with hyperglycemia, with long-term current use of insulin ([place]) 11/24/2015  . TIA (transient ischemic attack) 06/04/2015    Artist Pais, PTA 01/12/2021, 3:02 PM  [facility] [address]  [place], [place], [address] Phone: [phone]   Fax:  [phone]  Name: Ares Tegtmeyer MRN: [id number] Date of Birth: 1965/10/29

## 2021-01-15 ENCOUNTER — Other Ambulatory Visit: Payer: Self-pay | Admitting: Internal Medicine

## 2021-01-16 ENCOUNTER — Encounter: Payer: Managed Care, Other (non HMO) | Admitting: Physical Therapy

## 2021-01-19 ENCOUNTER — Ambulatory Visit: Payer: Managed Care, Other (non HMO) | Admitting: Physical Therapy

## 2021-01-22 ENCOUNTER — Ambulatory Visit: Payer: Managed Care, Other (non HMO) | Attending: Podiatry

## 2021-01-22 ENCOUNTER — Other Ambulatory Visit: Payer: Self-pay

## 2021-01-22 DIAGNOSIS — M79672 Pain in left foot: Secondary | ICD-10-CM

## 2021-01-22 DIAGNOSIS — M25672 Stiffness of left ankle, not elsewhere classified: Secondary | ICD-10-CM | POA: Diagnosis present

## 2021-01-22 DIAGNOSIS — R600 Localized edema: Secondary | ICD-10-CM

## 2021-01-22 DIAGNOSIS — R262 Difficulty in walking, not elsewhere classified: Secondary | ICD-10-CM | POA: Diagnosis present

## 2021-01-22 NOTE — Therapy (Signed)
[facility] [address]  [place], [place], [address] Phone: [phone]   Fax:  [phone]  Physical Therapy Treatment  Patient Details  Name: Helen Perez MRN: [id number] Date of Birth: 1966/02/05 Referring Provider (PT): Celesta Gentile, DPM   Encounter Date: 01/22/2021   PT End of Session - 01/22/21 1444    Visit Number 3    Number of Visits 13    Date for PT Re-Evaluation 02/18/21    Authorization Type [employer]    PT Start Time 1402    PT Stop Time 1443    PT Time Calculation (min) 41 min    Activity Tolerance Patient tolerated treatment well    Behavior During Therapy [facility] for tasks assessed/performed           Past Medical History:  Diagnosis Date  . Anxiety   . Depression   . Diabetes mellitus without complication ([place])   . H/O syphilis   . HSV-2 infection   . Hypertension associated with diabetes ([place]) 03/30/2018  . Renal insufficiency 10/13/2018  . Sleep apnea    CPAP  . Stroke [facility])     Past Surgical History:  Procedure Laterality Date  . ABDOMINAL HYSTERECTOMY    . FOOT SURGERY Right     There were no vitals filed for this visit.   Subjective Assessment - 01/22/21 1404    Subjective Pt was sick on earlier this week. Feet have been swollen and going into her ankle.    Pertinent History stroke, HTN, DM, depression, anxiety, R foot surgery    Diagnostic tests 10/03/20 R foot xray: Small avulsion fracture of the dorsal navicular with overlying  soft tissue swelling.    Patient Stated Goals avoid surgery    Currently in Pain? Yes    Pain Score 2     Pain Location Foot    Pain Orientation Left                             [facility] Adult PT Treatment/Exercise - 01/22/21 0001      Exercises   Exercises Ankle;Knee/Hip      Knee/Hip Exercises: Standing   Functional Squat 1 set;10 reps    Functional Squat Limitations TRX squat; cues for hip hinge      Manual Therapy    Manual Therapy Passive ROM    Passive ROM L ankle all motions, very tight in eversion; manual gastroc stretching      Ankle Exercises: Aerobic   Recumbent Bike L1x71min      Ankle Exercises: Standing   Heel Raises Both;10 reps    Other Standing Ankle Exercises weight shifting front and to R side 10 reps each    Other Standing Ankle Exercises SLS 30 sec with 1 LOB, 1 HHA      Ankle Exercises: Seated   Towel Crunch 2 reps;Limitations    Towel Crunch Limitations 2x 1 min L    Other Seated Ankle Exercises eversion/inversion with R tband 20 reps each      Ankle Exercises: Sidelying   Ankle Eversion AROM;Left;10 reps                    PT Short Term Goals - 01/22/21 1455      PT SHORT TERM GOAL #1   Title independent with initial HEP    Time 2    Period Weeks    Status On-going  Target Date 01/21/21             PT Long Term Goals - 01/22/21 1455      PT LONG TERM GOAL #1   Title Patient to be independent with advanced HEP.    Time 6    Period Weeks    Status On-going      PT LONG TERM GOAL #2   Title Patient to demonstrate B LE strength >/=4+/5.    Time 6    Period Weeks    Status On-going      PT LONG TERM GOAL #3   Title Patient to demonstrate L ankle AROM WFL and without pain limiting.    Time 6    Period Weeks    Status On-going      PT LONG TERM GOAL #4   Title Patient to report 1 full day of work without increasing pain >2/10.    Time 6    Period Weeks    Status On-going      PT LONG TERM GOAL #5   Title Patient to report 75% improvement in pain levels in the L foot.    Time 6    Period Weeks    Status On-going                 Plan - 01/22/21 1445    Clinical Impression Statement Pt reported to clinic with mod swelling in her L ankle, she reported only being able to ice once a day d/t her work hours. Advised her to ice more often with elevation and for 10-15 min at a time to minimize swelling. She has significant stiffness with  ankle eversion, focused treatment on IV/EV movements along with weight shifting exercises through the LEs. She showed good performance during the L SLS, except having LOB once and needing cues to use UE support intermittently. Good response with all other exercises. Offered her ice pack post treatment but pt declined noting that she had to make it to work.    Personal Factors and Comorbidities Age;Comorbidity 3+;Fitness;Past/Current Experience;Profession;Time since onset of injury/illness/exacerbation    Comorbidities stroke, HTN, DM, depression, anxiety, R foot surgery    PT Frequency 2x / week    PT Duration 6 weeks    PT Treatment/Interventions ADLs/Self Care Home Management;Cryotherapy;Electrical Stimulation;Iontophoresis 4mg /ml Dexamethasone;Moist Heat;Balance training;Therapeutic exercise;Therapeutic activities;Functional mobility training;Stair training;Gait training;Ultrasound;Neuromuscular re-education;Patient/family education;Manual techniques;Vasopneumatic Device;Taping;Energy conservation;Dry needling;Passive range of motion    PT Next Visit Plan progress AROM (mostly IV/EV) and strength to tolerance    Consulted and Agree with Plan of Care Patient           Patient will benefit from skilled therapeutic intervention in order to improve the following deficits and impairments:  Abnormal gait,Hypomobility,Increased edema,Decreased activity tolerance,Pain,Increased fascial restricitons,Decreased strength,Decreased balance,Difficulty walking,Increased muscle spasms,Improper body mechanics,Decreased range of motion,Impaired flexibility,Postural dysfunction  Visit Diagnosis: Pain in left foot  Stiffness of left ankle, not elsewhere classified  Difficulty in walking, not elsewhere classified  Localized edema     Problem List Patient Active Problem List   Diagnosis Date Noted  . Depression 11/07/2020  . Anxiety 11/07/2020  . Chronic sinusitis 09/24/2020  . Bilateral lower  extremity edema 05/06/2020  . Benign skin mole 05/06/2020  . Insomnia 05/06/2020  . BMI 34.0-34.9,adult 05/06/2020  . [name], left, sequela 02/15/2020  . Diabetes mellitus ([place]) 01/31/2020  . Dyslipidemia 01/31/2020  . Type 2 diabetes mellitus with diabetic polyneuropathy, with long-term current use of insulin ([place]) 01/31/2020  . Chronic  renal failure, stage 3a ([place]) 01/09/2020  . Constipation 01/08/2020  . DDD (degenerative disc disease), cervical 10/23/2018  . DDD (degenerative disc disease), lumbar 10/23/2018  . Renal insufficiency 10/13/2018  . OSA (obstructive sleep apnea) 06/29/2018  . Hypertension associated with diabetes ([place]) 03/30/2018  . GERD (gastroesophageal reflux disease) 09/06/2017  . Fatty liver 05/31/2017  . Anemia, chronic disease 04/11/2017  . Diabetic peripheral neuropathy associated with type 2 diabetes mellitus ([place]) 03/18/2016  . Hyperlipidemia associated with type 2 diabetes mellitus ([place]) 03/10/2016  . Type 2 diabetes mellitus with hyperglycemia, with long-term current use of insulin ([place]) 11/24/2015  . TIA (transient ischemic attack) 06/04/2015    Artist Pais, PTA 01/22/2021, 2:55 PM  [facility] [address]  [place], [place], [address] Phone: [phone]   Fax:  [phone]  Name: Helen Perez MRN: [id number] Date of Birth: [birth year]

## 2021-01-27 ENCOUNTER — Other Ambulatory Visit: Payer: Self-pay | Admitting: Medical-Surgical

## 2021-01-29 ENCOUNTER — Ambulatory Visit: Payer: Managed Care, Other (non HMO) | Admitting: Physical Therapy

## 2021-01-29 ENCOUNTER — Other Ambulatory Visit: Payer: Self-pay

## 2021-01-29 ENCOUNTER — Encounter: Payer: Self-pay | Admitting: Physical Therapy

## 2021-01-29 DIAGNOSIS — M25672 Stiffness of left ankle, not elsewhere classified: Secondary | ICD-10-CM

## 2021-01-29 DIAGNOSIS — M79672 Pain in left foot: Secondary | ICD-10-CM

## 2021-01-29 DIAGNOSIS — R600 Localized edema: Secondary | ICD-10-CM

## 2021-01-29 DIAGNOSIS — R262 Difficulty in walking, not elsewhere classified: Secondary | ICD-10-CM

## 2021-01-29 NOTE — Therapy (Signed)
[facility] [address]  [place], [place], [address] Phone: [phone]   Fax:  [phone]  Physical Therapy Treatment  Patient Details  Name: Helen Perez MRN: [id number] Date of Birth: 08-27-1966 Referring Provider (PT): Celesta Gentile, DPM   Encounter Date: 01/29/2021   PT End of Session - 01/29/21 1443    Visit Number 4    Number of Visits 13    Date for PT Re-Evaluation 02/18/21    Authorization Type [employer]    PT Start Time 1402    PT Stop Time 1441    PT Time Calculation (min) 39 min    Activity Tolerance Patient tolerated treatment well    Behavior During Therapy [facility] for tasks assessed/performed           Past Medical History:  Diagnosis Date  . Anxiety   . Depression   . Diabetes mellitus without complication ([place])   . H/O syphilis   . HSV-2 infection   . Hypertension associated with diabetes ([place]) 03/30/2018  . Renal insufficiency 10/13/2018  . Sleep apnea    CPAP  . Stroke [facility])     Past Surgical History:  Procedure Laterality Date  . ABDOMINAL HYSTERECTOMY    . FOOT SURGERY Right     There were no vitals filed for this visit.   Subjective Assessment - 01/29/21 1403    Subjective L ankle is getting a little better- better able to exercise it and move it around.    Pertinent History stroke, HTN, DM, depression, anxiety, R foot surgery    Diagnostic tests 10/03/20 R foot xray: Small avulsion fracture of the dorsal navicular with overlying  soft tissue swelling.    Patient Stated Goals avoid surgery    Currently in Pain? No/denies              [facility] PT Assessment - 01/29/21 0001      AROM   Left Ankle Dorsiflexion 6    Left Ankle Plantar Flexion 35    Left Ankle Inversion 20    Left Ankle Eversion 20                         [facility] Adult PT Treatment/Exercise - 01/29/21 0001      Knee/Hip Exercises: Aerobic   Stationary Bike L 1 x 6 min      Manual  Therapy   Manual Therapy Passive ROM;Joint mobilization    Joint Mobilization L ankle inversion, eversion jt mobs grade III    Passive ROM L ankle PROM in all directions with gentle distraction and prolonged holds for improved motion      Ankle Exercises: Sidelying   Ankle Inversion AROM;Strengthening;Left;10 reps   10# 0#, 10# 1# trying to acheive good stretch   Ankle Eversion AROM;Strengthening;Left;10 reps   10# 0#, 10# 1# trying to acheive good stretch     Ankle Exercises: Seated   Other Seated Ankle Exercises eversion/inversion with B tband 10 reps each   more limited ROM in inversion but good control     Ankle Exercises: Stretches   Gastroc Stretch 2 reps;30 seconds    Gastroc Stretch Limitations toes on cabinet                  PT Education - 01/29/21 1442    Education Details update to HEP; discussion on objective progress with ROM    Person(s) Educated Patient    Methods Explanation;Demonstration;Tactile  cues;Verbal cues;Handout    Comprehension Verbalized understanding;Returned demonstration            PT Short Term Goals - 01/29/21 1427      PT SHORT TERM GOAL #1   Title independent with initial HEP    Time 2    Period Weeks    Status Achieved    Target Date 01/21/21             PT Long Term Goals - 01/29/21 1427      PT LONG TERM GOAL #1   Title Patient to be independent with advanced HEP.    Time 6    Period Weeks    Status On-going      PT LONG TERM GOAL #2   Title Patient to demonstrate B LE strength >/=4+/5.    Time 6    Period Weeks    Status On-going      PT LONG TERM GOAL #3   Title Patient to demonstrate L ankle AROM WFL and without pain limiting.    Time 6    Period Weeks    Status On-going   no pain; improved but still limited     PT LONG TERM GOAL #4   Title Patient to report 1 full day of work without increasing pain >2/10.    Time 6    Period Weeks    Status On-going      PT LONG TERM GOAL #5   Title Patient to  report 75% improvement in pain levels in the L foot.    Time 6    Period Weeks    Status On-going                 Plan - 01/29/21 1443    Clinical Impression Statement Patient reporting improvement in L ankle since initial eval. Worked on L ankle PROM and joint mobilizations which patient tolerated well. Patient was able to demonstrate improvement in pain levels and ROM in dorsiflexion, plantar flexion, and inversion ROM since initial eval. Patient performed L ankle rotation strengthening with good speed and muscle control. Worked on more intense calf stretching for further improvement in calf flexibility, with patient demonstrating good tolerance. Also able to increase banded resistance with TB ankle inversion and eversion, thus updated this into HEP. Patient has tolerated session well and is demonstrating good progress towards goals.    Personal Factors and Comorbidities Age;Comorbidity 3+;Fitness;Past/Current Experience;Profession;Time since onset of injury/illness/exacerbation    Comorbidities stroke, HTN, DM, depression, anxiety, R foot surgery    PT Frequency 2x / week    PT Duration 6 weeks    PT Treatment/Interventions ADLs/Self Care Home Management;Cryotherapy;Electrical Stimulation;Iontophoresis 4mg /ml Dexamethasone;Moist Heat;Balance training;Therapeutic exercise;Therapeutic activities;Functional mobility training;Stair training;Gait training;Ultrasound;Neuromuscular re-education;Patient/family education;Manual techniques;Vasopneumatic Device;Taping;Energy conservation;Dry needling;Passive range of motion    PT Next Visit Plan progress AROM (mostly IV/EV) and strength to tolerance; ankle stability    Consulted and Agree with Plan of Care Patient           Patient will benefit from skilled therapeutic intervention in order to improve the following deficits and impairments:  Abnormal gait,Hypomobility,Increased edema,Decreased activity tolerance,Pain,Increased fascial  restricitons,Decreased strength,Decreased balance,Difficulty walking,Increased muscle spasms,Improper body mechanics,Decreased range of motion,Impaired flexibility,Postural dysfunction  Visit Diagnosis: Pain in left foot  Stiffness of left ankle, not elsewhere classified  Difficulty in walking, not elsewhere classified  Localized edema     Problem List Patient Active Problem List   Diagnosis Date Noted  . Depression 11/07/2020  . Anxiety 11/07/2020  .  Chronic sinusitis 09/24/2020  . Bilateral lower extremity edema 05/06/2020  . Benign skin mole 05/06/2020  . Insomnia 05/06/2020  . BMI 34.0-34.9,adult 05/06/2020  . [name], left, sequela 02/15/2020  . Diabetes mellitus ([place]) 01/31/2020  . Dyslipidemia 01/31/2020  . Type 2 diabetes mellitus with diabetic polyneuropathy, with long-term current use of insulin ([place]) 01/31/2020  . Chronic renal failure, stage 3a ([place]) 01/09/2020  . Constipation 01/08/2020  . DDD (degenerative disc disease), cervical 10/23/2018  . DDD (degenerative disc disease), lumbar 10/23/2018  . Renal insufficiency 10/13/2018  . OSA (obstructive sleep apnea) 06/29/2018  . Hypertension associated with diabetes ([place]) 03/30/2018  . GERD (gastroesophageal reflux disease) 09/06/2017  . Fatty liver 05/31/2017  . Anemia, chronic disease 04/11/2017  . Diabetic peripheral neuropathy associated with type 2 diabetes mellitus ([place]) 03/18/2016  . Hyperlipidemia associated with type 2 diabetes mellitus ([place]) 03/10/2016  . Type 2 diabetes mellitus with hyperglycemia, with long-term current use of insulin ([place]) 11/24/2015  . TIA (transient ischemic attack) 06/04/2015     Janene Harvey, PT, DPT 01/29/21 2:45 PM   [facility] [address]  [place], [place], [address] Phone: [phone]   Fax:  [phone]  Name: Georgene Kopper MRN: [id number] Date of Birth: 1966/06/06

## 2021-02-02 ENCOUNTER — Other Ambulatory Visit: Payer: Self-pay

## 2021-02-02 ENCOUNTER — Ambulatory Visit: Payer: Managed Care, Other (non HMO) | Admitting: Physical Therapy

## 2021-02-02 ENCOUNTER — Encounter: Payer: Self-pay | Admitting: Physical Therapy

## 2021-02-02 DIAGNOSIS — M25672 Stiffness of left ankle, not elsewhere classified: Secondary | ICD-10-CM

## 2021-02-02 DIAGNOSIS — R262 Difficulty in walking, not elsewhere classified: Secondary | ICD-10-CM

## 2021-02-02 DIAGNOSIS — M79672 Pain in left foot: Secondary | ICD-10-CM

## 2021-02-02 DIAGNOSIS — R600 Localized edema: Secondary | ICD-10-CM

## 2021-02-02 NOTE — Therapy (Addendum)
[facility] [address]  [place], [place], [address] Phone: [phone]   Fax:  [phone]  Physical Therapy Treatment  Patient Details  Name: Lezley Bedgood MRN: [id number] Date of Birth: 30-Jun-1966 Referring Provider (PT): Celesta Gentile, DPM   Encounter Date: 02/02/2021   PT End of Session - 02/02/21 1505    Visit Number 5    Number of Visits 13    Date for PT Re-Evaluation 02/18/21    Authorization Type [employer]    PT Start Time 1446    PT Stop Time 1514    PT Time Calculation (min) 28 min    Activity Tolerance Patient limited by pain    Behavior During Therapy [facility] for tasks assessed/performed           Past Medical History:  Diagnosis Date  . Anxiety   . Depression   . Diabetes mellitus without complication ([place])   . H/O syphilis   . HSV-2 infection   . Hypertension associated with diabetes ([place]) 03/30/2018  . Renal insufficiency 10/13/2018  . Sleep apnea    CPAP  . Stroke [facility])     Past Surgical History:  Procedure Laterality Date  . ABDOMINAL HYSTERECTOMY    . FOOT SURGERY Right     There were no vitals filed for this visit.   Subjective Assessment - 02/02/21 1447    Subjective "I'm not sure what I can do today, I think I broke my toe" Reports that she stubbed her toe on the bed post on friday. not able to wear a normal shoe.    Pertinent History stroke, HTN, DM, depression, anxiety, R foot surgery    Diagnostic tests 10/03/20 R foot xray: Small avulsion fracture of the dorsal navicular with overlying  soft tissue swelling.    Patient Stated Goals avoid surgery    Currently in Pain? Yes    Pain Score 7     Pain Location Toe (Comment which one)   middle 3   Pain Orientation Left    Pain Descriptors / Indicators Throbbing    Pain Type Acute pain                             [facility] Adult PT Treatment/Exercise - 02/02/21 0001      Modalities   Modalities Vasopneumatic       Vasopneumatic   Number Minutes Vasopneumatic  15 minutes    Vasopnuematic Location  Ankle   L   Vasopneumatic Pressure Low    Vasopneumatic Temperature  lowest                  PT Education - 02/02/21 1504    Education Details discussion on patient's L toes d/t concern over fracture- advised patient to move up her PCP or podiatrist appointment later this week for workup; educated on RICE    Person(s) Educated Patient    Methods Explanation;Demonstration;Tactile cues;Verbal cues    Comprehension Verbalized understanding            PT Short Term Goals - 01/29/21 1427      PT SHORT TERM GOAL #1   Title independent with initial HEP    Time 2    Period Weeks    Status Achieved    Target Date 01/21/21             PT Long Term Goals - 01/29/21 1427  PT LONG TERM GOAL #1   Title Patient to be independent with advanced HEP.    Time 6    Period Weeks    Status On-going      PT LONG TERM GOAL #2   Title Patient to demonstrate B LE strength >/=4+/5.    Time 6    Period Weeks    Status On-going      PT LONG TERM GOAL #3   Title Patient to demonstrate L ankle AROM WFL and without pain limiting.    Time 6    Period Weeks    Status On-going   no pain; improved but still limited     PT LONG TERM GOAL #4   Title Patient to report 1 full day of work without increasing pain >2/10.    Time 6    Period Weeks    Status On-going      PT LONG TERM GOAL #5   Title Patient to report 75% improvement in pain levels in the L foot.    Time 6    Period Weeks    Status On-going                 Plan - 02/02/21 1508    Clinical Impression Statement Patient arrived to session ambulating without AD, with report that she fears that she broke her toe on Friday from [place] her toe on a bedpost. L digits 2-4 appear bruised and edematous and are TTP, particularly over dorsum of digit 4. Educated patient on RICE and advised to f/u with PCP or podiatrist d/t  patient's concern of fx. Ended session with [place] to L foot for edema relief. Patient reported understanding of all edu provided today. Patient remarked on decreased edema and pain at end of session.    Personal Factors and Comorbidities Age;Comorbidity 3+;Fitness;Past/Current Experience;Profession;Time since onset of injury/illness/exacerbation    Comorbidities stroke, HTN, DM, depression, anxiety, R foot surgery    PT Frequency 2x / week    PT Duration 6 weeks    PT Treatment/Interventions ADLs/Self Care Home Management;Cryotherapy;Electrical Stimulation;Iontophoresis 41m/ml Dexamethasone;Moist Heat;Balance training;Therapeutic exercise;Therapeutic activities;Functional mobility training;Stair training;Gait training;Ultrasound;Neuromuscular re-education;Patient/family education;Manual techniques;Vasopneumatic Device;Taping;Energy conservation;Dry needling;Passive range of motion    PT Next Visit Plan progress AROM (mostly IV/EV) and strength to tolerance; ankle stability    Consulted and Agree with Plan of Care Patient           Patient will benefit from skilled therapeutic intervention in order to improve the following deficits and impairments:  Abnormal gait,Hypomobility,Increased edema,Decreased activity tolerance,Pain,Increased fascial restricitons,Decreased strength,Decreased balance,Difficulty walking,Increased muscle spasms,Improper body mechanics,Decreased range of motion,Impaired flexibility,Postural dysfunction  Visit Diagnosis: Pain in left foot  Stiffness of left ankle, not elsewhere classified  Difficulty in walking, not elsewhere classified  Localized edema     Problem List Patient Active Problem List   Diagnosis Date Noted  . Depression 11/07/2020  . Anxiety 11/07/2020  . Chronic sinusitis 09/24/2020  . Bilateral lower extremity edema 05/06/2020  . Benign skin mole 05/06/2020  . Insomnia 05/06/2020  . BMI 34.0-34.9,adult 05/06/2020  . [name], left,  sequela 02/15/2020  . Diabetes mellitus ([place] 01/31/2020  . Dyslipidemia 01/31/2020  . Type 2 diabetes mellitus with diabetic polyneuropathy, with long-term current use of insulin ([place] 01/31/2020  . Chronic renal failure, stage 3a ([place] 01/09/2020  . Constipation 01/08/2020  . DDD (degenerative disc disease), cervical 10/23/2018  . DDD (degenerative disc disease), lumbar 10/23/2018  . Renal insufficiency 10/13/2018  . OSA (obstructive sleep apnea)  06/29/2018  . Hypertension associated with diabetes ([place]) 03/30/2018  . GERD (gastroesophageal reflux disease) 09/06/2017  . Fatty liver 05/31/2017  . Anemia, chronic disease 04/11/2017  . Diabetic peripheral neuropathy associated with type 2 diabetes mellitus ([place]) 03/18/2016  . Hyperlipidemia associated with type 2 diabetes mellitus ([place]) 03/10/2016  . Type 2 diabetes mellitus with hyperglycemia, with long-term current use of insulin ([place]) 11/24/2015  . TIA (transient ischemic attack) 06/04/2015     Janene Harvey, PT, DPT 02/02/21 3:21 PM   [facility] [address]  [place], [place], [address] Phone: [phone]   Fax:  [phone]  Name: Riniyah Speich MRN: [id number] Date of Birth: February 04, 1966  PHYSICAL THERAPY DISCHARGE SUMMARY  Visits from Start of Care: 5  Current functional level related to goals / functional outcomes: Unable to assess; patient did not return d/t sustaining a toe fx   Remaining deficits: Unable to assess   Education / Equipment: HEP  Plan: Patient agrees to discharge.  Patient goals were not met. Patient is being discharged due to a change in medical status.  ?????     Janene Harvey, PT, DPT 03/05/21 3:38 PM

## 2021-02-04 ENCOUNTER — Encounter: Payer: Self-pay | Admitting: Medical-Surgical

## 2021-02-04 ENCOUNTER — Ambulatory Visit (INDEPENDENT_AMBULATORY_CARE_PROVIDER_SITE_OTHER): Payer: Managed Care, Other (non HMO) | Admitting: Medical-Surgical

## 2021-02-04 ENCOUNTER — Other Ambulatory Visit: Payer: Self-pay

## 2021-02-04 ENCOUNTER — Ambulatory Visit (INDEPENDENT_AMBULATORY_CARE_PROVIDER_SITE_OTHER): Payer: Managed Care, Other (non HMO)

## 2021-02-04 VITALS — BP 147/87 | HR 119 | Temp 97.7°F | Ht 67.0 in | Wt 221.0 lb

## 2021-02-04 DIAGNOSIS — M79672 Pain in left foot: Secondary | ICD-10-CM | POA: Diagnosis not present

## 2021-02-04 DIAGNOSIS — I152 Hypertension secondary to endocrine disorders: Secondary | ICD-10-CM

## 2021-02-04 DIAGNOSIS — E785 Hyperlipidemia, unspecified: Secondary | ICD-10-CM

## 2021-02-04 DIAGNOSIS — F419 Anxiety disorder, unspecified: Secondary | ICD-10-CM

## 2021-02-04 DIAGNOSIS — G4700 Insomnia, unspecified: Secondary | ICD-10-CM

## 2021-02-04 DIAGNOSIS — E1159 Type 2 diabetes mellitus with other circulatory complications: Secondary | ICD-10-CM | POA: Diagnosis not present

## 2021-02-04 DIAGNOSIS — F32A Depression, unspecified: Secondary | ICD-10-CM

## 2021-02-04 DIAGNOSIS — N1831 Chronic kidney disease, stage 3a: Secondary | ICD-10-CM

## 2021-02-04 MED ORDER — FLUCONAZOLE 150 MG PO TABS: 150.0000 mg | ORAL_TABLET | Freq: Once | ORAL | 2 refills | Status: AC

## 2021-02-04 NOTE — Progress Notes (Signed)
Subjective:    CC: Chronic disease follow-up  HPI: Pleasant 55 year old female presenting today for chronic disease follow-up.  HTN- BP mildly elevated today but patient is in significant pain. Not currently on medications for BP management.   DM- followed by endocrinology. Not doing well lately with her sugars since she has a snacking habit. Has been eating chocolate and peanut M&Ms to give her energy so she can get through her long work shifts. Very worried about her weight and notes the Ozempic isn't helping at all. Wants to be put on a weight loss medication.   CKD- No changes in urination. Taking Lasix for lower extremity edema.   HLD- Taking Lipitor 40mg  daily, tolerating well.    Mood- has really been struggling lately. Lots of stress with losing her father and having to move in with her mother. Long work hours and chronic pain haven't helped. Taking Amitriptyline 25mg  at bedtime which helps a little but not as much as she'd like. Has frequent thoughts that she would be better off dead but no plans in place for suicide or self harm. No HI.  Insomnia- Difficulty sleeping continues to be an issue especially in the setting of chronic pain, stress, anjd long unpredictable work hours. Ambien helps a little but not like it used to.   Left foot- still struggling with her left foot pain from the past year. Seeing podiatry and doing PT but pain is not any better. Taking Lyrica but it's not helping. Recently had an accident where she kicked the end of her bed and notes that her middle toe is black from the bruising. Thinks she broke her toe. Has not sought treatment or evaluation for the injury since she figured there was nothing to be done for it.   I reviewed the past medical history, family history, social history, surgical history, and allergies today and no changes were needed.  Please see the problem list section below in epic for further details.  Past Medical History: Past Medical  History:  Diagnosis Date  . Anxiety   . Depression   . Diabetes mellitus without complication ([place])   . H/O syphilis   . HSV-2 infection   . Hypertension associated with diabetes ([place]) 03/30/2018  . Renal insufficiency 10/13/2018  . Sleep apnea    CPAP  . Stroke [facility])    Past Surgical History: Past Surgical History:  Procedure Laterality Date  . ABDOMINAL HYSTERECTOMY    . FOOT SURGERY Right    Social History: Social History   Socioeconomic History  . Marital status: Single    Spouse name: Not on file  . Number of children: 1  . Years of education: Not on file  . Highest education level: Not on file  Occupational History  . Not on file  Tobacco Use  . Smoking status: Former Smoker    Types: Cigarettes    Quit date: 07/27/2012    Years since quitting: 8.5  . Smokeless tobacco: Never Used  Vaping Use  . Vaping Use: Never used  Substance and Sexual Activity  . Alcohol use: Yes    Comment: 2-3 drinks/month, wine liquor  . Drug use: No  . Sexual activity: Yes    Partners: Male    Birth control/protection: Surgical  Other Topics Concern  . Not on file  Social History Narrative  . Not on file   Social Determinants of Health   Financial Resource Strain: Not on file  Food Insecurity: Not on file  Transportation Needs:  Not on file  Physical Activity: Not on file  Stress: Not on file  Social Connections: Not on file   Family History: Family History  Problem Relation Age of Onset  . Diabetes Mother   . Heart disease Mother   . Heart failure Mother   . Diabetes Sister   . Kidney failure Sister   . Other Brother        quadraplegia  . Diabetes Maternal Grandmother   . Diabetes Brother   . Kidney failure Brother   . Diabetes Brother   . Kidney failure Father   . Colon cancer Neg Hx   . Esophageal cancer Neg Hx   . Pancreatic cancer Neg Hx   . Stomach cancer Neg Hx   . Liver disease Neg Hx    Allergies: Allergies  Allergen Reactions  . Adhesive [Tape]      From dexcom  . Latex Itching and Swelling  . Other Dermatitis    FREESTYLE LIBRE SENSOR- cellulitis, wound on skin    Medications: See med rec.  Review of Systems: See HPI for pertinent positives and negatives.   Objective:    General: Well Developed, well nourished, and in no acute distress.  Neuro: Alert and oriented x3.  HEENT: Normocephalic, atraumatic.  Skin: Warm and dry. Bruising to the left 3rd toe.  Cardiac: Regular rate and rhythm, no murmurs rubs or gallops, no lower extremity edema.  Respiratory: Clear to auscultation bilaterally. Not using accessory muscles, speaking in full sentences.  Impression and Recommendations:    1. Hypertension associated with diabetes ([place]) Checking CBC and CMP. Not initiating medication today but will plan to monitor at next provider visit. Likely will return to normal once pain is better addressed.  - CBC  2. Dyslipidemia Checking lipid panel. Continue Lipitor 40mg  daily.  - Lipid panel  3. Anxiety 4. Depression, unspecified depression type Continue Amitriptyline 25mg  at bedtime. Considering adding Wellbutrin to help with energy, appetite suppression, and mood. Consulting with clinical pharmacist for her imput.   5. Insomnia, unspecified type Continue Ambien CR 12.5mg  at bedtime as needed for sleep.   6. Chronic renal failure, stage 3a (HCC) Checking CMP.  - COMPLETE METABOLIC PANEL WITH GFR  7. Left foot pain Getting x-rays today for further evaluation.  - DG Foot Complete Left; Future  Return in about 3 months (around 05/06/2021) for chronic disease follow up. ___________________________________________ Clearnce Sorrel, DNP, APRN, FNP-BC [facility] [place]

## 2021-02-05 ENCOUNTER — Ambulatory Visit: Payer: Managed Care, Other (non HMO)

## 2021-02-05 ENCOUNTER — Other Ambulatory Visit: Payer: Self-pay | Admitting: Medical-Surgical

## 2021-02-05 MED ORDER — NALTREXONE-BUPROPION HCL ER 8-90 MG PO TB12: ORAL_TABLET | ORAL | 0 refills | Status: DC

## 2021-02-05 NOTE — Progress Notes (Signed)
Discussed weight loss options with the clinical pharmacist and feel that Contrave is the best option at this point. It looks like her insurance will require a prior auth so will get that process started as soon as possible. Patient called to discuss the plan and is agreeable. Also notified patient of her x-ray findings. Notified Dr. Jacqualyn Posey of the findings and he plans to reach out to her to see if she can come in tomorrow for evaluation. For now, recommend avoiding weight bearing as much as possible and if she has to be on her feet, resume wearing the walking boot that she kept. If unable to get in with Dr. Jacqualyn Posey sooner than early May, will refer to Dr. Lucia Gaskins in the hopes that she can get in sooner.  Clearnce Sorrel, DNP, APRN, FNP-BC [facility]

## 2021-02-06 ENCOUNTER — Encounter: Payer: Self-pay | Admitting: Podiatry

## 2021-02-06 ENCOUNTER — Ambulatory Visit (INDEPENDENT_AMBULATORY_CARE_PROVIDER_SITE_OTHER): Payer: Managed Care, Other (non HMO) | Admitting: Podiatry

## 2021-02-06 ENCOUNTER — Other Ambulatory Visit: Payer: Self-pay

## 2021-02-06 ENCOUNTER — Ambulatory Visit: Payer: Managed Care, Other (non HMO) | Admitting: Medical-Surgical

## 2021-02-06 ENCOUNTER — Ambulatory Visit: Payer: Managed Care, Other (non HMO) | Admitting: Podiatry

## 2021-02-06 DIAGNOSIS — S92512A Displaced fracture of proximal phalanx of left lesser toe(s), initial encounter for closed fracture: Secondary | ICD-10-CM

## 2021-02-06 DIAGNOSIS — E1165 Type 2 diabetes mellitus with hyperglycemia: Secondary | ICD-10-CM

## 2021-02-06 DIAGNOSIS — M2041 Other hammer toe(s) (acquired), right foot: Secondary | ICD-10-CM

## 2021-02-06 DIAGNOSIS — E1149 Type 2 diabetes mellitus with other diabetic neurological complication: Secondary | ICD-10-CM

## 2021-02-06 DIAGNOSIS — S93622S Sprain of tarsometatarsal ligament of left foot, sequela: Secondary | ICD-10-CM

## 2021-02-06 DIAGNOSIS — M2042 Other hammer toe(s) (acquired), left foot: Secondary | ICD-10-CM

## 2021-02-06 DIAGNOSIS — IMO0002 Reserved for concepts with insufficient information to code with codable children: Secondary | ICD-10-CM

## 2021-02-09 ENCOUNTER — Encounter: Payer: Managed Care, Other (non HMO) | Admitting: Physical Therapy

## 2021-02-09 ENCOUNTER — Telehealth: Payer: Self-pay

## 2021-02-09 NOTE — Telephone Encounter (Signed)
PA for naltrexone-bupropion ER 8-90 mg (Contrave) submitted through [email or web address] PA is pending

## 2021-02-10 LAB — COMPLETE METABOLIC PANEL WITH GFR
AG Ratio: 1 (calc) (ref 1.0–2.5)
ALT: 50 U/L — ABNORMAL HIGH (ref 6–29)
AST: 38 U/L — ABNORMAL HIGH (ref 10–35)
Albumin: 4 g/dL (ref 3.6–5.1)
Alkaline phosphatase (APISO): 188 U/L — ABNORMAL HIGH (ref 37–153)
BUN/Creatinine Ratio: 21 (calc) (ref 6–22)
BUN: 25 mg/dL (ref 7–25)
CO2: 25 mmol/L (ref 20–32)
Calcium: 9.5 mg/dL (ref 8.6–10.4)
Chloride: 101 mmol/L (ref 98–110)
Creat: 1.18 mg/dL — ABNORMAL HIGH (ref 0.50–1.05)
GFR, Est African American: 60 mL/min/{1.73_m2} (ref >=60)
GFR, Est Non African American: 52 mL/min/{1.73_m2} — ABNORMAL LOW (ref >=60)
Globulin: 4.2 g/dL (calc) — ABNORMAL HIGH (ref 1.9–3.7)
Glucose, Bld: 257 mg/dL — ABNORMAL HIGH (ref 65–99)
Potassium: 4.4 mmol/L (ref 3.5–5.3)
Sodium: 136 mmol/L (ref 135–146)
Total Bilirubin: 0.5 mg/dL (ref 0.2–1.2)
Total Protein: 8.2 g/dL — ABNORMAL HIGH (ref 6.1–8.1)

## 2021-02-10 LAB — CBC
HCT: 42.1 % (ref 35.0–45.0)
Hemoglobin: 12.7 g/dL (ref 11.7–15.5)
MCH: 24.9 pg — ABNORMAL LOW (ref 27.0–33.0)
MCHC: 30.2 g/dL — ABNORMAL LOW (ref 32.0–36.0)
MCV: 82.4 fL (ref 80.0–100.0)
MPV: 10.5 fL (ref 7.5–12.5)
Platelets: 349 10*3/uL (ref 140–400)
RBC: 5.11 10*6/uL — ABNORMAL HIGH (ref 3.80–5.10)
RDW: 13.3 % (ref 11.0–15.0)
WBC: 8.9 10*3/uL (ref 3.8–10.8)

## 2021-02-10 LAB — HEMOGLOBINOPATHY EVALUATION
Fetal Hemoglobin Testing: <1 % (ref 0.0–1.9)
HCT: 44.3 % (ref 35.0–45.0)
Hemoglobin A2 - HGBRFX: 2.4 % (ref 2.2–3.2)
Hemoglobin: 12.6 g/dL (ref 11.7–15.5)
Hgb A: 97.6 % (ref >96.0)
MCH: 25 pg — ABNORMAL LOW (ref 27.0–33.0)
MCV: 88.1 fL (ref 80.0–100.0)
RBC: 5.03 10*6/uL (ref 3.80–5.10)
RDW: 13.6 % (ref 11.0–15.0)

## 2021-02-10 LAB — LIPID PANEL
Cholesterol: 129 mg/dL (ref <200)
HDL: 52 mg/dL (ref >=50)
LDL Cholesterol (Calc): 58 mg/dL (calc)
Non-HDL Cholesterol (Calc): 77 mg/dL (calc) (ref <130)
Total CHOL/HDL Ratio: 2.5 (calc) (ref <5.0)
Triglycerides: 108 mg/dL (ref <150)

## 2021-02-10 LAB — FERRITIN: Ferritin: 174 ng/mL (ref 16–232)

## 2021-02-10 LAB — IRON, TOTAL/TOTAL IRON BINDING CAP
%SAT: 16 % (calc) (ref 16–45)
Iron: 50 ug/dL (ref 45–160)
TIBC: 306 mcg/dL (calc) (ref 250–450)

## 2021-02-10 NOTE — Progress Notes (Signed)
Subjective: 55 year old female presents the office today for concerns of toe fractures.  She states that she hit the toes about a week ago.  She did not notice it until she had swelling.  She said that she does not have any pain although she does have neuropathy able to move her toes without any pain.  Prior to this surgery she states that her feet were feeling much better.  She said the physical therapy is been helpful and she was not having any significant swelling or pain to her foot.  Also concerned about the other toes becoming hammertoes most of the first toe and second toe which is been a chronic issue but seems to be getting worse.  She also reports that her A1c is elevated. Denies any systemic complaints such as fevers, chills, nausea, vomiting. No acute changes since last appointment, and no other complaints at this time.   Objective: AAO x3, NAD DP/PT pulses palpable bilaterally, CRT less than 3 seconds Sensation decreased with Semmes Weinstein monofilament There is no sign of discomfort on Lisfranc joint left foot or the right foot.  There is no edema to the foot itself there is edema mostly to the third and fourth digits of the left foot.  The toes are in rectus position.  Hammertoes were present of the first and second digits of the left foot.  There is no open lesion identified. No pain with calf compression, swelling, warmth, erythema  Assessment: Displaced toe fractures left third and fourth digits; hammertoes; neuropathy  Plan: -All treatment options discussed with the patient including all alternatives, risks, complications.  -I independently reviewed the x-rays from yesterday.  Displaced fractures of the third and fourth toes.  Due to the displacement discussed surgical intervention.  However after discussion her A1c is up to a 9.5 which has put her at increased risk of complications.  After long discussion with her and she also has concerns about her hammertoes in the first and  second digits.  She is incision surgery for those as well but given her A1c had not been performed elective surgery.  I think that discussion today we decided to treat the toes conservatively and see if regarding heel and if they cause problems once her A1c is down we can fix all the toes at one time.  However if there is no signs of healing discussed surgical intervention. -Prior to the injury she states that her left foot was feeling much better not having any significant pain or swelling.  She is to remain in the cam boot for the toes but also this could help with the Lisfranc injury as well although not, significant issues at this point. -Patient encouraged to call the office with any questions, concerns, change in symptoms.   Trula Slade DPM

## 2021-02-11 ENCOUNTER — Ambulatory Visit: Payer: Managed Care, Other (non HMO) | Admitting: Family Medicine

## 2021-02-11 MED ORDER — AMOXICILLIN-POT CLAVULANATE 875-125 MG PO TABS: 1.0000 | ORAL_TABLET | Freq: Two times a day (BID) | ORAL | 0 refills | Status: DC

## 2021-02-11 NOTE — Telephone Encounter (Addendum)
Pt aware and will stop by the office tomorrow after she gets off work to sign the forms for the appeal.  As a side note, she states she has a sinus infection and would like something called in. Per verbal info by Caryl Asp, she will send in a Rx for Augmentin to take BID x 7 days. Pt aware this Rx will be sent to [place] on file. Rx has been tee'd up below and ready for review and approval/denial.

## 2021-02-11 NOTE — Telephone Encounter (Signed)
Notification received from CoverMyMeds regarding PA for naltrexone-bupropion ER 8-90  PA has been denied.

## 2021-02-11 NOTE — Telephone Encounter (Signed)
Prior authorization for Contrave was denied.  Paperwork was received via fax that will allow for appeal.  I have completed the provider part for the appeal but she will need to come by and sign the medical records release section of the form before we are able to fax the appeal request back to the insurance agency.  This will not guarantee approval but it is worth a second shot.  Paperwork has been placed in [employer].

## 2021-02-12 NOTE — Telephone Encounter (Signed)
PA appeal form placed in accordion file at front desk. Please return to [place] once pt has signed the designated spots.   No charge for form completion.

## 2021-02-16 ENCOUNTER — Encounter: Payer: Self-pay | Admitting: Internal Medicine

## 2021-02-16 ENCOUNTER — Ambulatory Visit (INDEPENDENT_AMBULATORY_CARE_PROVIDER_SITE_OTHER): Payer: Managed Care, Other (non HMO) | Admitting: Internal Medicine

## 2021-02-16 ENCOUNTER — Other Ambulatory Visit: Payer: Self-pay

## 2021-02-16 VITALS — BP 140/78 | HR 105 | Ht 67.0 in | Wt 216.2 lb

## 2021-02-16 DIAGNOSIS — E1159 Type 2 diabetes mellitus with other circulatory complications: Secondary | ICD-10-CM

## 2021-02-16 DIAGNOSIS — Z794 Long term (current) use of insulin: Secondary | ICD-10-CM | POA: Diagnosis not present

## 2021-02-16 DIAGNOSIS — E1165 Type 2 diabetes mellitus with hyperglycemia: Secondary | ICD-10-CM | POA: Diagnosis not present

## 2021-02-16 DIAGNOSIS — E1142 Type 2 diabetes mellitus with diabetic polyneuropathy: Secondary | ICD-10-CM

## 2021-02-16 LAB — POCT GLYCOSYLATED HEMOGLOBIN (HGB A1C): Hemoglobin A1C: 9.5 % — AB (ref 4.0–5.6)

## 2021-02-16 MED ORDER — INSULIN ASPART 100 UNIT/ML CARTRIDGE (PENFILL): SUBCUTANEOUS | 6 refills | Status: DC

## 2021-02-16 MED ORDER — INPEN 100-PINK-NOVOLOG-FIASP DEVI: 1.0000 | Freq: Once | 0 refills | Status: AC

## 2021-02-16 NOTE — Progress Notes (Signed)
Name: Mariane Burpee  Age/ Sex: 55 y.o., female   MRN/ DOB: [id number], [birth year]     PCP: Samuel Bouche, NP   Reason for Endocrinology Evaluation: Type 2 Diabetes Mellitus  Initial Endocrine Consultative Visit: 01/30/2020    PATIENT IDENTIFIER: Ms. Tameko Halder is a 55 y.o. female with a past medical history of HTN, TIA, OSA and T2DM. The patient has followed with Endocrinology clinic since 01/30/2020 for consultative assistance with management of her diabetes.  DIABETIC HISTORY:  Ms. Masterson was diagnosed with T2DM in 2000. She reports intolerance to Metformin.  She has been on an insulin pump for years.  Her hemoglobin A1c has ranged from 10.1% in 12/10/2019, peaking at 15.7% in 2016.  On her initial visit to our clinic her A1c was 12.5% , she was on Ozempic and novolog through insulin pump. We did not make any changes as she  Uses the pump only periodically as well as the Decox. Chronic hx of non-compliance, and was given the option to switch to MDI vs using the pump properly , she opted to continue with the pump at the time.     Works 3 pm to 2:30 Am , goes to bed at 4:30 , wakes up at 10- 11 Am   By 04/2020 we stopped insulin pump and started MDI regimen   Farxiga started 10/2020  SUBJECTIVE:   During the last visit (11/17/2020): A1c 9.5% . Continued Ozempic , MDI regimen started farxiga     Today (02/16/2021): Ms. Schreier is here for a follow up on diabetes  She checks her blood sugars multiple  times daily, through the dexcom. The patient has not had hypoglycemic episodes since the last clinic visit.  Denies nausea or diarrhea  Farxiga worsened her recurrent genital infections    Has a left  unhealed fracture,  planning on sx with an A1c < 7.5%     HOME DIABETES REGIMEN:  - Lantus 36 units once daily  - Novolog 14 units with each meal - has been taking 12  - Ozempic 1 mg weekly  - Farxiga 5 mg daily  - Novolog 3 units with snacks   Statin: Yes ACE-I/ARB:  yes     CONTINUOUS GLUCOSE MONITORING RECORD INTERPRETATION    Dates of Recording:4/17-4/30/2022   Sensor description:Dexcom  Results statistics:   CGM use % of time 100%  Average and SD 205/67  Time in range   42 %  % Time Above 180 31  % Time above 250 26  % Time Below target <1       Glycemic patterns summary: Hyperglycemia during the day , at times overnight   Hyperglycemic episodes  postprandial  Hypoglycemic episodes occurred none  Overnight periods: trends down , variable          DIABETIC COMPLICATIONS: Microvascular complications:   Neuropathy  Denies: retinopathy,  CKD  Last eye exam: Completed 2021  Macrovascular complications:   CVA  Denies: CAD, PVD   HISTORY:  Past Medical History:  Past Medical History:  Diagnosis Date  . Anxiety   . Depression   . Diabetes mellitus without complication ([place])   . H/O syphilis   . HSV-2 infection   . Hypertension associated with diabetes ([place]) 03/30/2018  . Renal insufficiency 10/13/2018  . Sleep apnea    CPAP  . Stroke [facility])    Past Surgical History:  Past Surgical History:  Procedure Laterality Date  . ABDOMINAL HYSTERECTOMY    . FOOT SURGERY Right  Social History:  reports that she quit smoking about 8 years ago. Her smoking use included cigarettes. She has never used smokeless tobacco. She reports current alcohol use. She reports that she does not use drugs. Family History:  Family History  Problem Relation Age of Onset  . Diabetes Mother   . Heart disease Mother   . Heart failure Mother   . Diabetes Sister   . Kidney failure Sister   . Other Brother        quadraplegia  . Diabetes Maternal Grandmother   . Diabetes Brother   . Kidney failure Brother   . Diabetes Brother   . Kidney failure Father   . Colon cancer Neg Hx   . Esophageal cancer Neg Hx   . Pancreatic cancer Neg Hx   . Stomach cancer Neg Hx   . Liver disease Neg Hx      HOME MEDICATIONS: Allergies as  of 02/16/2021      Reactions   Adhesive [tape]    From dexcom   Latex Itching, Swelling   Other Dermatitis   FREESTYLE LIBRE SENSOR- cellulitis, wound on skin       Medication List       Accurate as of Feb 16, 2021  9:32 AM. If you have any questions, ask your nurse or doctor.        acetaminophen 650 MG CR tablet Commonly known as: TYLENOL Take 1 tablet by mouth as needed.   acyclovir ointment 5 % Commonly known as: ZOVIRAX Apply topically.   amitriptyline 25 MG tablet Commonly known as: ELAVIL Take 1 tablet (25 mg total) by mouth at bedtime.   amoxicillin-clavulanate 875-125 MG tablet Commonly known as: AUGMENTIN Take 1 tablet by mouth 2 (two) times daily.   aspirin EC 81 MG tablet Take 1 tablet by mouth daily.   atorvastatin 40 MG tablet Commonly known as: LIPITOR Take 1 tablet (40 mg total) by mouth daily.   dapagliflozin propanediol 5 MG Tabs tablet Commonly known as: Farxiga Take 1 tablet (5 mg total) by mouth daily before breakfast.   Dexcom G6 Receiver Devi Use instructed to check blood sugar daily   Dexcom G6 Sensor Misc INJECT 1 DEVICE INTO THE SKIN AS DIRECTED EVERY 10 DAYS   Dexcom G6 Transmitter Misc 1 Package by Does not apply route every 3 (three) months.   escitalopram 20 MG tablet Commonly known as: LEXAPRO Take 1 tablet by mouth daily.   fexofenadine-pseudoephedrine 180-240 MG 24 hr tablet Commonly known as: ALLEGRA-D 24 Take 1 tablet by mouth daily as needed.   fluconazole 150 MG tablet Commonly known as: DIFLUCAN Take 150 mg by mouth once.   furosemide 20 MG tablet Commonly known as: LASIX Take 1 tablet (20 mg total) by mouth daily. If significant swelling that is not responding to elevation and compression, take 1 extra tablet daily no more than 3 times weekly.   Insulin Pen Needle 29G X Misc 1 Device by Does not apply route as directed.   lisinopril 5 MG tablet Commonly known as: ZESTRIL Take 1 tablet by mouth daily.    meloxicam 7.5 MG tablet Commonly known as: MOBIC Take by mouth.   mupirocin ointment 2 % Commonly known as: BACTROBAN Apply 1 application topically. 2-3 times daily   Naltrexone-buPROPion HCl ER 8-90 MG Tb12 1 tab daily for week 1, then 1 tab BID for week 2, then 2 tab PO qAM and 1 tab PO qPM for week 3, then 2 tabs BID.  NONFORMULARY OR COMPOUNDED ITEM Antifungal solution: Terbinafine 3%, Fluconazole 2%, Tea Tree Oil 5%, Urea 10%, Ibuprofen 2% in DMSO suspension #43mL   NovoLOG FlexPen 100 UNIT/ML FlexPen Generic drug: insulin aspart Inject 12 Units into the skin 3 (three) times daily with meals.   omeprazole 20 MG capsule Commonly known as: PRILOSEC Take 1 capsule by mouth daily.   Ozempic (1 MG/DOSE) 4 MG/3ML Sopn Generic drug: Semaglutide (1 MG/DOSE) DIAL AND INJECT UNDER THE SKIN 1 MG WEEKLY   prednisoLONE acetate 1 % ophthalmic suspension Commonly known as: PRED FORTE SMARTSIG:In Eye(s)   pregabalin 150 MG capsule Commonly known as: LYRICA Take 1 capsule (150 mg total) by mouth 2 (two) times daily.   senna-docusate 8.6-50 MG tablet Commonly known as: Senokot-S Take by mouth.   Skin Tac Adhesive Barrier Wipe Misc 1 applicator by Does not apply route as directed.   Toujeo Max SoloStar 300 UNIT/ML Solostar Pen Generic drug: insulin glargine (2 Unit Dial) Inject 36 Units into the skin daily.   triamcinolone 55 MCG/ACT Aero nasal inhaler Commonly known as: NASACORT Place 2 sprays into the nose daily.   Trulance 3 MG Tabs Generic drug: Plecanatide Take 1 tablet by mouth daily as needed.   valACYclovir 500 MG tablet Commonly known as: VALTREX Take 1 tablet (500 mg total) by mouth daily.   zolpidem 12.5 MG CR tablet Commonly known as: AMBIEN CR TAKE ONE TABLET BY MOUTH EVERY NIGHT AT BEDTIME AS NEEDED FOR SLEEP        OBJECTIVE:   Vital Signs: BP 140/78   Pulse (!) 105   Ht 5\' 7"  (1.702 m)   Wt 216 lb 4 oz (98.1 kg)   SpO2 94%   BMI 33.87 kg/m    Wt Readings from Last 3 Encounters:  02/16/21 216 lb 4 oz (98.1 kg)  02/04/21 221 lb 0.6 oz (100.3 kg)  11/17/20 215 lb (97.5 kg)     Exam: General: Pt appears well and is in NAD  Lungs: Clear with good BS bilat with no rales, rhonchi, or wheezes  Heart: RRR   Abdomen: Normoactive bowel sounds, soft, nontender  Extremities: No pretibial edema.  Neuro: MS is good with appropriate affect, pt is alert and Ox3    DM foot exam: 01/30/2020  The skin of the feet is intact without sores or ulcerations. The pedal pulses are 2+ on right and 2+ on left. The sensation is decreased  to a screening 5.07, 10 gram monofilament bilaterally    DATA REVIEWED:  Lab Results  Component Value Date   HGBA1C 9.5 (A) 02/16/2021   HGBA1C 9.5 (A) 11/17/2020   HGBA1C 8.0 (A) 08/14/2020   Lab Results  Component Value Date   MICROALBUR 38.5 (H) 01/30/2020   LDLCALC 58 02/04/2021   CREATININE 1.18 (H) 02/04/2021   Lab Results  Component Value Date   MICRALBCREAT 59.1 (H) 01/30/2020     Lab Results  Component Value Date   CHOL 129 02/04/2021   HDL 52 02/04/2021   LDLCALC 58 02/04/2021   TRIG 108 02/04/2021   CHOLHDL 2.5 02/04/2021         ASSESSMENT / PLAN / RECOMMENDATIONS:   1) Type 2 Diabetes Mellitus, Poorly controlled, With neuropathic and macrovascular  complications - Most recent A1c of 9.5 %. Goal A1c < 7.0 %.    - She was not a great candidate for an insulin pump  Due to sporadic use and when she would not use the pump, she was not  injecting herself.  - Wilder Glade has caused worsening of genital infections, will stop this  - Will increase Prandial insulin as below  - She will be provided with a correction scale  - We discussed the InPEn and she would like to try, prescription sent to the pharmacy as well as a referral for proper education on this   MEDICATIONS: -Continue Lantus 36 units once daily  -Increase  Novolog to 16  units with each meal  -Continue Novolog 3 units  with a snack  - Continue Ozempic 1 mg weekly  - Stop Farxiga 5 mg daily  - CF : Novolog ( BG-130/20)    EDUCATION / INSTRUCTIONS:  BG monitoring instructions: Patient is instructed to check her blood sugars 4 times a day, before meals and bedtime .  Call [facility] if: BG persistently < 70  . I reviewed the Rule of 15 for the treatment of hypoglycemia in detail with the patient. Literature supplied.    2) Diabetic complications:   Eye: Does not have known diabetic retinopathy.   Neuro/ Feet: Does have known diabetic peripheral neuropathy .   Renal: Patient does not have known baseline CKD. She   is  on an ACEI/ARB at present.    3) BMI 33:   - She continues to  be frustrated with her weight . BMI did not meet criteria for weight loss sx, or medications - I have reviewed postprandial hyperglycemia and how limiting the amount of start will improve her weight as well as glucose control, I also have advised to avoid snacks this will also have dual benefit to weight as well as glucose control        F/U in 3 months    Signed electronically by: Mack Guise, MD  [facility]  [facility] [address] [place], [place] [address] Phone: [phone] FAX: [phone]   CC: Samuel Bouche, [place] [address] [place] [address] Phone: [phone]  Fax: [phone]  Return to Endocrinology clinic as below: Future Appointments  Date Time Provider [place]  02/19/2021 11:15 AM Trula Slade, DPM [facility]  03/06/2021  9:15 AM Jacqualyn Posey Bonna Gains, DPM [facility] None

## 2021-02-16 NOTE — Patient Instructions (Addendum)
-   Stop farxiga  - Continue Lantus 36 units daily  - Increase Novolog to 16 units with each meal  - Novolog 3 units with a snack  - Continue Ozempic 1 mg weekly  -Novolog correctional insulin: ADD extra units on insulin to your meal-time Novolog dose if your blood sugars are higher than 150. Use the scale below to help guide you:   Blood sugar before meal Number of units to inject  Less than 150 0 unit  151 -  170 1 units  171 -  190 2 units  191 -  210 3 units  211 -  230 4 units  231 -  250 5 units  251 -  270 6 units  271 -  290 7 units  291 -  310 8 units  311 - 330 9 units           HOW TO TREAT LOW BLOOD SUGARS (Blood sugar LESS THAN 70 MG/DL)  Please follow the RULE OF 15 for the treatment of hypoglycemia treatment (when your (blood sugars are less than 70 mg/dL)    STEP 1: Take 15 grams of carbohydrates when your blood sugar is low, which includes:   3-4 GLUCOSE TABS  OR  3-4 OZ OF JUICE OR REGULAR SODA OR  ONE TUBE OF GLUCOSE GEL     STEP 2: RECHECK blood sugar in 15 MINUTES STEP 3: If your blood sugar is still low at the 15 minute recheck --> then, go back to STEP 1 and treat AGAIN with another 15 grams of carbohydrates.

## 2021-02-19 ENCOUNTER — Ambulatory Visit: Payer: Managed Care, Other (non HMO) | Admitting: Podiatry

## 2021-02-23 ENCOUNTER — Other Ambulatory Visit: Payer: Self-pay | Admitting: Internal Medicine

## 2021-02-24 ENCOUNTER — Other Ambulatory Visit: Payer: Self-pay

## 2021-02-24 ENCOUNTER — Encounter: Payer: Managed Care, Other (non HMO) | Attending: Internal Medicine | Admitting: Nutrition

## 2021-02-24 DIAGNOSIS — Z794 Long term (current) use of insulin: Secondary | ICD-10-CM | POA: Diagnosis present

## 2021-02-24 DIAGNOSIS — E1165 Type 2 diabetes mellitus with hyperglycemia: Secondary | ICD-10-CM

## 2021-02-24 NOTE — Progress Notes (Signed)
Patient was trained on how to use the InPen.  It was linked to her app, and settings were put in:  Set fixed dosing of 16u, with a snack of 3u,  ISF: 20,  Target 150, timing 4 hours.  Lantus dose was set to 36u at 11:00AM as a reminder.   She was shown how to use the pen to calculate her insulin dose, and we discussed IOB, and what this means.  She reported good understanding of this, and re demononstrated how to calculate the insulin dose correctly X2.  She inserted the Novolog cartridge and shown how to give an air shot We tried to link her dexcom to her InPen app, but she could not remember her user name and password.  She was told to call the Dexcom help line for help with this.   She was strongly recommended to read the manual today, and agreed to do this.  She was also told to call the In Pen help line if questions.  She had no final questions.

## 2021-02-24 NOTE — Patient Instructions (Signed)
Read over manual on the InPen Call help line if questions.

## 2021-02-25 ENCOUNTER — Other Ambulatory Visit: Payer: Self-pay | Admitting: Medical-Surgical

## 2021-03-06 ENCOUNTER — Other Ambulatory Visit: Payer: Self-pay

## 2021-03-06 ENCOUNTER — Ambulatory Visit (INDEPENDENT_AMBULATORY_CARE_PROVIDER_SITE_OTHER): Payer: Managed Care, Other (non HMO) | Admitting: Podiatry

## 2021-03-06 ENCOUNTER — Encounter: Payer: Self-pay | Admitting: Podiatry

## 2021-03-06 ENCOUNTER — Ambulatory Visit (INDEPENDENT_AMBULATORY_CARE_PROVIDER_SITE_OTHER): Payer: Managed Care, Other (non HMO)

## 2021-03-06 DIAGNOSIS — S92512A Displaced fracture of proximal phalanx of left lesser toe(s), initial encounter for closed fracture: Secondary | ICD-10-CM

## 2021-03-06 DIAGNOSIS — S93622S Sprain of tarsometatarsal ligament of left foot, sequela: Secondary | ICD-10-CM

## 2021-03-06 DIAGNOSIS — IMO0002 Reserved for concepts with insufficient information to code with codable children: Secondary | ICD-10-CM

## 2021-03-06 DIAGNOSIS — M14672 Charcot's joint, left ankle and foot: Secondary | ICD-10-CM

## 2021-03-06 DIAGNOSIS — S92512D Displaced fracture of proximal phalanx of left lesser toe(s), subsequent encounter for fracture with routine healing: Secondary | ICD-10-CM

## 2021-03-06 DIAGNOSIS — E1165 Type 2 diabetes mellitus with hyperglycemia: Secondary | ICD-10-CM

## 2021-03-06 DIAGNOSIS — E1149 Type 2 diabetes mellitus with other diabetic neurological complication: Secondary | ICD-10-CM

## 2021-03-06 NOTE — Progress Notes (Signed)
Subjective: 55 year old female presents the office today for follow-up evaluation of fractures to her left foot.  She states that she feels getting better although still having some swelling most of the third toe.  She has been keeping the toes taped.  She wants to proceed with surgery have the toe straight as they are all causing discomfort even prior to the injury there was hammertoes and also for the midfoot pain.  She has follow-up with endocrinology medications been adjusted.  Last A1c was 9.5 but she states that daily blood sugars have been running better.  She is asking for shorter cam boot today. Denies any systemic complaints such as fevers, chills, nausea, vomiting. No acute changes since last appointment, and no other complaints at this time.   Objective: AAO x3, NAD DP/PT pulses palpable bilaterally, CRT less than 3 seconds Sensation decreased with Semmes Weinstein monofilament There is no significant discomfort on Lisfranc joint left foot or the right foot.  Minimal edema to the foot.  Edema present to the third toe.  Decreasing to motion of the PIPJ of the left third toe.  Hammertoe contractures present to lesser digits.  Small bruise present on the third toe, she is been taping the toes.  No open lesions. No pain with calf compression, swelling, warmth, erythema  Assessment: Displaced toe fractures left third and fourth digits; hammertoes; neuropathy  Plan: -All treatment options discussed with the patient including all alternatives, risks, complications.  -Repeat x-rays obtained and independent reviewed.  Discussed them with her.  Still radiolucent line still evident with displacement. -We discussed with conservative as well as surgical options.  She wants to have the surgery on her toes as well as the midfoot and I agree with her about this.  Hopefully the A1c will continue to improve and undergo surgery.  I did discuss with her the postoperative course for both operations.  For now  remain in cam boot and updated note was provided.  I dispensed a new cam boot today for her.  Trula Slade DPM

## 2021-04-03 ENCOUNTER — Ambulatory Visit (INDEPENDENT_AMBULATORY_CARE_PROVIDER_SITE_OTHER): Payer: Managed Care, Other (non HMO) | Admitting: Podiatry

## 2021-04-03 ENCOUNTER — Ambulatory Visit (INDEPENDENT_AMBULATORY_CARE_PROVIDER_SITE_OTHER): Payer: Managed Care, Other (non HMO)

## 2021-04-03 ENCOUNTER — Encounter: Payer: Self-pay | Admitting: Podiatry

## 2021-04-03 ENCOUNTER — Other Ambulatory Visit: Payer: Self-pay

## 2021-04-03 DIAGNOSIS — E1149 Type 2 diabetes mellitus with other diabetic neurological complication: Secondary | ICD-10-CM

## 2021-04-03 DIAGNOSIS — S93622S Sprain of tarsometatarsal ligament of left foot, sequela: Secondary | ICD-10-CM

## 2021-04-03 DIAGNOSIS — M14672 Charcot's joint, left ankle and foot: Secondary | ICD-10-CM

## 2021-04-03 DIAGNOSIS — S92512D Displaced fracture of proximal phalanx of left lesser toe(s), subsequent encounter for fracture with routine healing: Secondary | ICD-10-CM

## 2021-04-03 DIAGNOSIS — IMO0002 Reserved for concepts with insufficient information to code with codable children: Secondary | ICD-10-CM

## 2021-04-03 DIAGNOSIS — E1165 Type 2 diabetes mellitus with hyperglycemia: Secondary | ICD-10-CM

## 2021-04-03 NOTE — Progress Notes (Signed)
Subjective: 55 year old female presents the office today for follow-up evaluation of fractures to her left foot.  She states that she is doing somewhat better and the pain is improved as well as the swelling.  She has continued to work this is the 35th day straight of work she reports.  She states that her feet actually hurt more as the swelling improves and she thinks that she is losing the padding which causes increased discomfort at times.  No recent injury or trauma any changes otherwise since I last saw her.  She is scheduled in [place] for vacation coming up.  She states that she has good days and bad days with her blood sugar but more good days she reports.  Denies any systemic complaints such as fevers, chills, nausea, vomiting. No acute changes since last appointment, and no other complaints at this time.   Objective: AAO x3, NAD DP/PT pulses palpable bilaterally, CRT less than 3 seconds Sensation decreased with Semmes Weinstein monofilament There is no significant discomfort on Lisfranc joint left foot or the right foot but there is mild diffuse tenderness.  Minimal edema to the foot itself.  Edema present to the third toe.  Decreasing to motion of the PIPJ of the left third toe.  Hammertoe contractures present to lesser digits.  On the lateral third toe small scab is present there is no ulceration drainage or signs of infection.  No open lesions. No pain with calf compression, swelling, warmth, erythema  Assessment: Displaced toe fractures left third and fourth digits; hammertoes; neuropathy  Plan: -All treatment options discussed with the patient including all alternatives, risks, complications.  -Repeat x-rays obtained and independently reviewed.  Discussed them with her.  Still radiolucent line still evident with displacement but some mild evidence of healing. -We had discussed glucose control.  Discussed trying wear stiffer soled shoe.  She is to look for the graphite insert if not  she will come by the office to pick up 1, size 11.  Discussed with her when she has new onset shoes she wears a stiff soled shoe in order to help take pressure off the foot.  Once her sugars improve discussed surgical intervention again today.  Overall she seems to be doing well.  Trula Slade DPM

## 2021-04-06 ENCOUNTER — Telehealth: Payer: Self-pay | Admitting: *Deleted

## 2021-04-06 NOTE — Telephone Encounter (Signed)
Patient is wanting to let the doctor know that she did not find a graphite insert for her shoe and to please bring one to [place] on Friday if possible.

## 2021-04-07 NOTE — Telephone Encounter (Signed)
Called and spoke with the patient and stated that I would bring the graphite with me to [facility] on Friday. Helen Perez

## 2021-04-10 NOTE — Telephone Encounter (Signed)
Patient is calling and wants to know if the plate for her shoes are at office.please call.

## 2021-04-24 ENCOUNTER — Other Ambulatory Visit: Payer: Self-pay | Admitting: Internal Medicine

## 2021-04-30 ENCOUNTER — Encounter: Payer: Self-pay | Admitting: Family Medicine

## 2021-04-30 ENCOUNTER — Ambulatory Visit (INDEPENDENT_AMBULATORY_CARE_PROVIDER_SITE_OTHER): Payer: Managed Care, Other (non HMO) | Admitting: Family Medicine

## 2021-04-30 ENCOUNTER — Other Ambulatory Visit: Payer: Self-pay

## 2021-04-30 VITALS — BP 148/78 | HR 120 | Temp 98.7°F | Ht 67.0 in | Wt 225.0 lb

## 2021-04-30 DIAGNOSIS — J0140 Acute pansinusitis, unspecified: Secondary | ICD-10-CM

## 2021-04-30 MED ORDER — FLUCONAZOLE 150 MG PO TABS: 150.0000 mg | ORAL_TABLET | Freq: Once | ORAL | 0 refills | Status: AC

## 2021-04-30 MED ORDER — PREDNISONE 20 MG PO TABS
40.0000 mg | ORAL_TABLET | Freq: Every day | ORAL | 0 refills | Status: AC
Start: 2021-04-30 — End: 2021-05-05

## 2021-04-30 MED ORDER — DOXYCYCLINE HYCLATE 100 MG PO TABS: 100.0000 mg | ORAL_TABLET | Freq: Two times a day (BID) | ORAL | 0 refills | Status: AC

## 2021-04-30 NOTE — Progress Notes (Signed)
Acute Office Visit  Subjective:    Patient ID: Helen Perez, female    DOB: 14-Aug-1966, 55 y.o.   MRN: [id number]  Chief Complaint  Patient presents with   Sinusitis    HPI Patient is in today for sinusitis.  About a week ago patient felt like she was starting to get a sinus infection.  Reports she has chronic seasonal allergies that tend to lead to sinus infections.  She has even seen ENT in the past- no other causes found.  Reports she has been experiencing postnasal drainage causing a cough, sinus pressure 10 out of 10 mostly on the right, bilateral ear pressure.  She denies any fevers, chest pain, shortness of breath, nausea, vomiting or diarrhea.  Last antibiotic was Augmentin in April.   Past Medical History:  Diagnosis Date   Anxiety    Depression    Diabetes mellitus without complication ([place])    H/O syphilis    HSV-2 infection    Hypertension associated with diabetes ([place]) 03/30/2018   Renal insufficiency 10/13/2018   Sleep apnea    CPAP   Stroke [facility])     Past Surgical History:  Procedure Laterality Date   ABDOMINAL HYSTERECTOMY     FOOT SURGERY Right     Family History  Problem Relation Age of Onset   Diabetes Mother    Heart disease Mother    Heart failure Mother    Diabetes Sister    Kidney failure Sister    Other Brother        quadraplegia   Diabetes Maternal Grandmother    Diabetes Brother    Kidney failure Brother    Diabetes Brother    Kidney failure Father    Colon cancer Neg Hx    Esophageal cancer Neg Hx    Pancreatic cancer Neg Hx    Stomach cancer Neg Hx    Liver disease Neg Hx     Social History   Socioeconomic History   Marital status: Single    Spouse name: Not on file   Number of children: 1   Years of education: Not on file   Highest education level: Not on file  Occupational History   Not on file  Tobacco Use   Smoking status: Former    Types: Cigarettes    Quit date: 07/27/2012    Years since quitting: 8.7    Smokeless tobacco: Never  Vaping Use   Vaping Use: Never used  Substance and Sexual Activity   Alcohol use: Yes    Comment: 2-3 drinks/month, wine liquor   Drug use: No   Sexual activity: Yes    Partners: Male    Birth control/protection: Surgical  Other Topics Concern   Not on file  Social History Narrative   Not on file   Social Determinants of Health   Financial Resource Strain: Not on file  Food Insecurity: Not on file  Transportation Needs: Not on file  Physical Activity: Not on file  Stress: Not on file  Social Connections: Not on file  Intimate Partner Violence: Not on file    Outpatient Medications Prior to Visit  Medication Sig Dispense Refill   acetaminophen (TYLENOL) 650 MG CR tablet Take 1 tablet by mouth as needed.     acyclovir ointment (ZOVIRAX) 5 % Apply topically.     amitriptyline (ELAVIL) 25 MG tablet Take 1 tablet (25 mg total) by mouth at bedtime. 90 tablet 1   aspirin EC 81 MG tablet Take 1 tablet  by mouth daily.     atorvastatin (LIPITOR) 40 MG tablet Take 1 tablet (40 mg total) by mouth daily. 90 tablet 3   Continuous Blood Gluc Receiver (DEXCOM G6 RECEIVER) DEVI Use instructed to check blood sugar daily 1 each 0   Continuous Blood Gluc Sensor (DEXCOM G6 SENSOR) MISC INJECT 1 DEVICE INTO THE SKIN AS DIRECTED EVERY 10 DAYS 3 each 6   Continuous Blood Gluc Transmit (DEXCOM G6 TRANSMITTER) MISC 1 Package by Does not apply route every 3 (three) months. 3 each 1   escitalopram (LEXAPRO) 20 MG tablet Take 1 tablet by mouth daily.     fexofenadine-pseudoephedrine (ALLEGRA-D 24) 180-240 MG 24 hr tablet Take 1 tablet by mouth daily as needed.     furosemide (LASIX) 20 MG tablet Take 1 tablet (20 mg total) by mouth daily. If significant swelling that is not responding to elevation and compression, take 1 extra tablet daily no more than 3 times weekly. 105 tablet 3   insulin aspart (NOVOLOG) cartridge Max daily 80 units 45 mL 6   insulin glargine, 2 Unit Dial,  (TOUJEO MAX SOLOSTAR) 300 UNIT/ML Solostar Pen Inject 36 Units into the skin daily. 15 mL 3   Insulin Pen Needle 29G X 5MM MISC 1 Device by Does not apply route as directed. 150 each 11   lisinopril (ZESTRIL) 5 MG tablet Take 1 tablet by mouth daily.     meloxicam (MOBIC) 7.5 MG tablet Take by mouth.     mupirocin ointment (BACTROBAN) 2 % Apply 1 application topically. 2-3 times daily     Naltrexone-buPROPion HCl ER 8-90 MG TB12 1 tab daily for week 1, then 1 tab BID for week 2, then 2 tab PO qAM and 1 tab PO qPM for week 3, then 2 tabs BID. 120 tablet 0   NONFORMULARY OR COMPOUNDED ITEM Antifungal solution: Terbinafine 3%, Fluconazole 2%, Tea Tree Oil 5%, Urea 10%, Ibuprofen 2% in DMSO suspension #59mL 1 each 3   omeprazole (PRILOSEC) 20 MG capsule Take 1 capsule by mouth daily.     Ostomy Supplies (SKIN TAC ADHESIVE BARRIER WIPE) MISC 1 applicator by Does not apply route as directed. 50 each 6   OZEMPIC, 1 MG/DOSE, 4 MG/3ML SOPN DIAL AND INJECT UNDER THE SKIN 1 MG WEEKLY 3 mL 2   Plecanatide (TRULANCE) 3 MG TABS Take 1 tablet by mouth daily as needed. 90 tablet 3   prednisoLONE acetate (PRED FORTE) 1 % ophthalmic suspension SMARTSIG:In Eye(s)     pregabalin (LYRICA) 150 MG capsule Take 1 capsule (150 mg total) by mouth 2 (two) times daily. 180 capsule 3   senna-docusate (SENOKOT-S) 8.6-50 MG tablet Take by mouth.     triamcinolone (NASACORT) 55 MCG/ACT AERO nasal inhaler Place 2 sprays into the nose daily. 1 each 12   valACYclovir (VALTREX) 500 MG tablet Take 1 tablet (500 mg total) by mouth daily. 90 tablet 3   zolpidem (AMBIEN CR) 12.5 MG CR tablet TAKE ONE TABLET BY MOUTH EVERY NIGHT AT BEDTIME AS NEEDED FOR SLEEP 30 tablet 2   amoxicillin-clavulanate (AUGMENTIN) 875-125 MG tablet Take 1 tablet by mouth 2 (two) times daily. 14 tablet 0   fluconazole (DIFLUCAN) 150 MG tablet Take 150 mg by mouth once.     No facility-administered medications prior to visit.    Allergies  Allergen  Reactions   Adhesive [Tape]     From dexcom   Latex Itching and Swelling   Other Dermatitis    FREESTYLE LIBRE SENSOR-  cellulitis, wound on skin     Review of Systems All review of systems negative except what is listed in the HPI     Objective:    Physical Exam Vitals reviewed.  Constitutional:      Appearance: Normal appearance.  HENT:     Head: Normocephalic and atraumatic.     Comments: Tenderness of maxillary and frontal sinuses, R>L    Ears:     Comments: Bilateral effusions    Nose: Congestion present.  Eyes:     Conjunctiva/sclera: Conjunctivae normal.  Cardiovascular:     Rate and Rhythm: Normal rate and regular rhythm.  Pulmonary:     Effort: Pulmonary effort is normal.     Breath sounds: Normal breath sounds.  Skin:    Findings: No rash.  Neurological:     General: No focal deficit present.     Mental Status: She is alert and oriented to person, place, and time. Mental status is at baseline.  Psychiatric:        Mood and Affect: Mood normal.        Behavior: Behavior normal.        Thought Content: Thought content normal.        Judgment: Judgment normal.    BP (!) 148/78   Pulse (!) 120   Temp 98.7 F (37.1 C)   Ht 5\' 7"  (1.702 m)   Wt 225 lb (102.1 kg) Comment: wearing ortho boot  SpO2 96%   BMI 35.24 kg/m  Wt Readings from Last 3 Encounters:  04/30/21 225 lb (102.1 kg)  02/16/21 216 lb 4 oz (98.1 kg)  02/04/21 221 lb 0.6 oz (100.3 kg)    Health Maintenance Due  Topic Date Due   Zoster Vaccines- Shingrix (1 of 2) Never done   Pneumococcal Vaccine 67-12 Years old (2 - PCV) 06/06/2016   MAMMOGRAM  11/24/2020   PAP SMEAR-Modifier  01/14/2021   OPHTHALMOLOGY EXAM  03/26/2021    There are no preventive care reminders to display for this patient.   No results found for: TSH Lab Results  Component Value Date   WBC 8.9 02/04/2021   HGB 12.7 02/04/2021   HGB 12.6 02/04/2021   HCT 42.1 02/04/2021   HCT 44.3 02/04/2021   MCV 82.4  02/04/2021   MCV 88.1 02/04/2021   PLT 349 02/04/2021   Lab Results  Component Value Date   NA 136 02/04/2021   K 4.4 02/04/2021   CO2 25 02/04/2021   GLUCOSE 257 (H) 02/04/2021   BUN 25 02/04/2021   CREATININE 1.18 (H) 02/04/2021   BILITOT 0.5 02/04/2021   ALKPHOS 139 (H) 10/06/2018   AST 38 (H) 02/04/2021   ALT 50 (H) 02/04/2021   PROT 8.2 (H) 02/04/2021   ALBUMIN 3.6 10/06/2018   CALCIUM 9.5 02/04/2021   ANIONGAP 7 10/06/2018   Lab Results  Component Value Date   CHOL 129 02/04/2021   Lab Results  Component Value Date   HDL 52 02/04/2021   Lab Results  Component Value Date   LDLCALC 58 02/04/2021   Lab Results  Component Value Date   TRIG 108 02/04/2021   Lab Results  Component Value Date   CHOLHDL 2.5 02/04/2021   Lab Results  Component Value Date   HGBA1C 9.5 (A) 02/16/2021       Assessment & Plan:   1. Acute pansinusitis, recurrence not specified Given duration and severity of symptoms going into the weekend, will go ahead and send in doxycycline and  a burst of prednisone.  Reports that she tends to get yeast infections following antibiotics so we will go and send in some Diflucan.  Continue supportive measures including rest, hydration, humidifier use, warm compresses, over-the-counter analgesics. Patient aware of signs/symptoms requiring further/urgent evaluation.  - doxycycline (VIBRA-TABS) 100 MG tablet; Take 1 tablet (100 mg total) by mouth 2 (two) times daily for 7 days.  Dispense: 14 tablet; Refill: 0 - fluconazole (DIFLUCAN) 150 MG tablet; Take 1 tablet (150 mg total) by mouth once for 1 dose.  Dispense: 1 tablet; Refill: 0 - predniSONE (DELTASONE) 20 MG tablet; Take 2 tablets (40 mg total) by mouth daily with breakfast for 5 days.  Dispense: 10 tablet; Refill: 0  Follow-up if symptoms worsen or fail to improve.  Purcell Nails Olevia Bowens, DNP, FNP-C

## 2021-05-08 ENCOUNTER — Encounter: Payer: Self-pay | Admitting: Podiatry

## 2021-05-08 ENCOUNTER — Ambulatory Visit (INDEPENDENT_AMBULATORY_CARE_PROVIDER_SITE_OTHER): Payer: Managed Care, Other (non HMO) | Admitting: Podiatry

## 2021-05-08 ENCOUNTER — Ambulatory Visit (INDEPENDENT_AMBULATORY_CARE_PROVIDER_SITE_OTHER): Payer: Managed Care, Other (non HMO)

## 2021-05-08 ENCOUNTER — Other Ambulatory Visit: Payer: Self-pay

## 2021-05-08 DIAGNOSIS — S9031XA Contusion of right foot, initial encounter: Secondary | ICD-10-CM | POA: Diagnosis not present

## 2021-05-08 DIAGNOSIS — S92512A Displaced fracture of proximal phalanx of left lesser toe(s), initial encounter for closed fracture: Secondary | ICD-10-CM

## 2021-05-08 DIAGNOSIS — E1165 Type 2 diabetes mellitus with hyperglycemia: Secondary | ICD-10-CM

## 2021-05-08 DIAGNOSIS — E1149 Type 2 diabetes mellitus with other diabetic neurological complication: Secondary | ICD-10-CM | POA: Diagnosis not present

## 2021-05-08 DIAGNOSIS — S92251S Displaced fracture of navicular [scaphoid] of right foot, sequela: Secondary | ICD-10-CM | POA: Diagnosis not present

## 2021-05-08 DIAGNOSIS — IMO0002 Reserved for concepts with insufficient information to code with codable children: Secondary | ICD-10-CM

## 2021-05-08 DIAGNOSIS — M25571 Pain in right ankle and joints of right foot: Secondary | ICD-10-CM

## 2021-05-08 DIAGNOSIS — S92512D Displaced fracture of proximal phalanx of left lesser toe(s), subsequent encounter for fracture with routine healing: Secondary | ICD-10-CM

## 2021-05-13 NOTE — Progress Notes (Signed)
Subjective: 55 year old female presents the office today for follow-up evaluation of fractures to her left foot.  States that she is doing okay.  She modifications, she is returned to work.  She states that she gets when she wears the cam boot.  She said her blood sugars been high due to recent infection and she got medication for this.  Occasional discomfort of top of her right foot.  No recent injuries.  No changes otherwise.  No open sores.  Objective: AAO x3, NAD DP/PT pulses palpable bilaterally, CRT less than 3 seconds Sensation decreased with Semmes Weinstein monofilament There is slight discomfort on Lisfranc joint left foot today.  There is still edema present most of the third toe but appears to be improved.  On the lateral third toe this Is present previously has resolved there is no ulceration.  On discomfort the dorsal to the right foot. No pain with calf compression, swelling, warmth, erythema  Assessment: Displaced toe fractures left third and fourth digits; hammertoes; neuropathy; right dorsal foot pain  Plan: -All treatment options discussed with the patient including all alternatives, risks, complications.  -Repeat x-rays obtained and independently reviewed of the left foot at the time of the patient's appointment.  Discussed them with her.  Still radiolucent line still evident with displacement but some mild evidence of healing. -Order right foot x-rays -For now continue stiffer soled shoes.  Discussed glucose control again and I do think that once her sugar is adequately controlled discussed Lisfranc arthrodesis as well as hammertoe repair.  Trula Slade DPM

## 2021-05-18 ENCOUNTER — Ambulatory Visit: Payer: Managed Care, Other (non HMO) | Admitting: Internal Medicine

## 2021-05-18 NOTE — Progress Notes (Deleted)
Name: Helen Perez  Age/ Sex: 55 y.o., female   MRN/ DOB: [id number], [birth year]     PCP: Samuel Bouche, NP   Reason for Endocrinology Evaluation: Type 2 Diabetes Mellitus  Initial Endocrine Consultative Visit: 01/30/2020    PATIENT IDENTIFIER: Helen Perez is a 55 y.o. female with a past medical history of HTN, TIA, OSA and T2DM. The patient has followed with Endocrinology clinic since 01/30/2020 for consultative assistance with management of her diabetes.  DIABETIC HISTORY:  Helen Perez was diagnosed with T2DM in 2000. She reports intolerance to Metformin.  She has been on an insulin pump for years.  Her hemoglobin A1c has ranged from 10.1% in 12/10/2019, peaking at 15.7% in 2016.  On her initial visit to our clinic her A1c was 12.5% , she was on Ozempic and novolog through insulin pump. We did not make any changes as she  Uses the pump only periodically as well as the Decox. Chronic hx of non-compliance, and was given the option to switch to MDI vs using the pump properly , she opted to continue with the pump at the time.     Works 3 pm to 2:30 Am , goes to bed at 4:30 , wakes up at 10- 11 Am   By 04/2020 we stopped insulin pump and started MDI regimen   Farxiga started 10/2020 and stopped by 02/2021 due to worsening genital infections   SUBJECTIVE:   During the last visit (02/16/2021): A1c 9.5% . Continued Ozempic , MDI regimen , stopped farxiga     Today (05/18/2021): Helen Perez is here for a follow up on diabetes  She checks her blood sugars multiple  times daily, through the dexcom. The patient has not had hypoglycemic episodes since the last clinic visit.  Denies nausea or diarrhea  Farxiga worsened her recurrent genital infections    Has a left  unhealed fracture,  planning on sx with an A1c < 7.5%     HOME DIABETES REGIMEN:  - Lantus 36 units once daily  - Novolog 16 units with each meal - Ozempic 1 mg weekly  - Novolog (BG-130/20)     Statin: Yes ACE-I/ARB:  yes     CONTINUOUS GLUCOSE MONITORING RECORD INTERPRETATION    Dates of Recording:4/17-4/30/2022   Sensor description:Dexcom  Results statistics:   CGM use % of time 100%  Average and SD 205/67  Time in range   42 %  % Time Above 180 31  % Time above 250 26  % Time Below target <1       Glycemic patterns summary: Hyperglycemia during the day , at times overnight   Hyperglycemic episodes  postprandial  Hypoglycemic episodes occurred none  Overnight periods: trends down , variable          DIABETIC COMPLICATIONS: Microvascular complications:  Neuropathy Denies: retinopathy,  CKD Last eye exam: Completed 2021   Macrovascular complications:  CVA Denies: CAD, PVD    HISTORY:  Past Medical History:  Past Medical History:  Diagnosis Date   Anxiety    Depression    Diabetes mellitus without complication ([place])    H/O syphilis    HSV-2 infection    Hypertension associated with diabetes ([place]) 03/30/2018   Renal insufficiency 10/13/2018   Sleep apnea    CPAP   Stroke [facility])    Past Surgical History:  Past Surgical History:  Procedure Laterality Date   ABDOMINAL HYSTERECTOMY     FOOT SURGERY Right    Social History:  reports  that she quit smoking about 8 years ago. Her smoking use included cigarettes. She has never used smokeless tobacco. She reports current alcohol use. She reports that she does not use drugs. Family History:  Family History  Problem Relation Age of Onset   Diabetes Mother    Heart disease Mother    Heart failure Mother    Diabetes Sister    Kidney failure Sister    Other Brother        quadraplegia   Diabetes Maternal Grandmother    Diabetes Brother    Kidney failure Brother    Diabetes Brother    Kidney failure Father    Colon cancer Neg Hx    Esophageal cancer Neg Hx    Pancreatic cancer Neg Hx    Stomach cancer Neg Hx    Liver disease Neg Hx      HOME MEDICATIONS: Allergies as of 05/18/2021       Reactions    Adhesive [tape]    From dexcom   Latex Itching, Swelling   Other Dermatitis   FREESTYLE LIBRE SENSOR- cellulitis, wound on skin         Medication List        Accurate as of May 18, 2021  7:26 AM. If you have any questions, ask your nurse or doctor.          acetaminophen 650 MG CR tablet Commonly known as: TYLENOL Take 1 tablet by mouth as needed.   acyclovir ointment 5 % Commonly known as: ZOVIRAX Apply topically.   amitriptyline 25 MG tablet Commonly known as: ELAVIL Take 1 tablet (25 mg total) by mouth at bedtime.   aspirin EC 81 MG tablet Take 1 tablet by mouth daily.   atorvastatin 40 MG tablet Commonly known as: LIPITOR Take 1 tablet (40 mg total) by mouth daily.   Dexcom G6 Receiver Devi Use instructed to check blood sugar daily   Dexcom G6 Sensor Misc INJECT 1 DEVICE INTO THE SKIN AS DIRECTED EVERY 10 DAYS   Dexcom G6 Transmitter Misc 1 Package by Does not apply route every 3 (three) months.   escitalopram 20 MG tablet Commonly known as: LEXAPRO Take 1 tablet by mouth daily.   fexofenadine-pseudoephedrine 180-240 MG 24 hr tablet Commonly known as: ALLEGRA-D 24 Take 1 tablet by mouth daily as needed.   fluconazole 150 MG tablet Commonly known as: DIFLUCAN Take 150 mg by mouth once.   furosemide 20 MG tablet Commonly known as: LASIX Take 1 tablet (20 mg total) by mouth daily. If significant swelling that is not responding to elevation and compression, take 1 extra tablet daily no more than 3 times weekly.   insulin aspart cartridge Commonly known as: NOVOLOG Max daily 80 units   Insulin Pen Needle 29G X 5MM Misc 1 Device by Does not apply route as directed.   lisinopril 5 MG tablet Commonly known as: ZESTRIL Take 1 tablet by mouth daily.   meloxicam 7.5 MG tablet Commonly known as: MOBIC Take by mouth.   mupirocin ointment 2 % Commonly known as: BACTROBAN Apply 1 application topically. 2-3 times daily   Naltrexone-buPROPion  HCl ER 8-90 MG Tb12 1 tab daily for week 1, then 1 tab BID for week 2, then 2 tab PO qAM and 1 tab PO qPM for week 3, then 2 tabs BID.   NONFORMULARY OR COMPOUNDED ITEM Antifungal solution: Terbinafine 3%, Fluconazole 2%, Tea Tree Oil 5%, Urea 10%, Ibuprofen 2% in DMSO suspension #74m   omeprazole  20 MG capsule Commonly known as: PRILOSEC Take 1 capsule by mouth daily.   Ozempic (1 MG/DOSE) 4 MG/3ML Sopn Generic drug: Semaglutide (1 MG/DOSE) DIAL AND INJECT UNDER THE SKIN 1 MG WEEKLY   prednisoLONE acetate 1 % ophthalmic suspension Commonly known as: PRED FORTE SMARTSIG:In Eye(s)   pregabalin 150 MG capsule Commonly known as: LYRICA Take 1 capsule (150 mg total) by mouth 2 (two) times daily.   senna-docusate 8.6-50 MG tablet Commonly known as: Senokot-S Take by mouth.   Skin Tac Adhesive Barrier Wipe Misc 1 applicator by Does not apply route as directed.   Toujeo Max SoloStar 300 UNIT/ML Solostar Pen Generic drug: insulin glargine (2 Unit Dial) Inject 36 Units into the skin daily.   triamcinolone 55 MCG/ACT Aero nasal inhaler Commonly known as: NASACORT Place 2 sprays into the nose daily.   Trulance 3 MG Tabs Generic drug: Plecanatide Take 1 tablet by mouth daily as needed.   valACYclovir 500 MG tablet Commonly known as: VALTREX Take 1 tablet (500 mg total) by mouth daily.   zolpidem 12.5 MG CR tablet Commonly known as: AMBIEN CR TAKE ONE TABLET BY MOUTH EVERY NIGHT AT BEDTIME AS NEEDED FOR SLEEP         OBJECTIVE:   Vital Signs: There were no vitals taken for this visit.  Wt Readings from Last 3 Encounters:  04/30/21 225 lb (102.1 kg)  02/16/21 216 lb 4 oz (98.1 kg)  02/04/21 221 lb 0.6 oz (100.3 kg)     Exam: General: Pt appears well and is in NAD  Lungs: Clear with good BS bilat with no rales, rhonchi, or wheezes  Heart: RRR   Abdomen: Normoactive bowel sounds, soft, nontender  Extremities: No pretibial edema.  Neuro: MS is good with  appropriate affect, pt is alert and Ox3    DM foot exam: 01/30/2020   The skin of the feet is intact without sores or ulcerations. The pedal pulses are 2+ on right and 2+ on left. The sensation is decreased  to a screening 5.07, 10 gram monofilament bilaterally    DATA REVIEWED:  Lab Results  Component Value Date   HGBA1C 9.5 (A) 02/16/2021   HGBA1C 9.5 (A) 11/17/2020   HGBA1C 8.0 (A) 08/14/2020   Lab Results  Component Value Date   MICROALBUR 38.5 (H) 01/30/2020   LDLCALC 58 02/04/2021   CREATININE 1.18 (H) 02/04/2021   Lab Results  Component Value Date   MICRALBCREAT 59.1 (H) 01/30/2020     Lab Results  Component Value Date   CHOL 129 02/04/2021   HDL 52 02/04/2021   LDLCALC 58 02/04/2021   TRIG 108 02/04/2021   CHOLHDL 2.5 02/04/2021         ASSESSMENT / PLAN / RECOMMENDATIONS:   1) Type 2 Diabetes Mellitus, Poorly controlled, With neuropathic and macrovascular  complications - Most recent A1c of 9.5 %. Goal A1c < 7.0 %.    - She was not a great candidate for an insulin pump  Due to sporadic use and when she would not use the pump, she was not injecting herself.  - Wilder Glade has caused worsening of genital infections, will stop this  - Will increase Prandial insulin as below  - She will be provided with a correction scale  - We discussed the InPEn and she would like to try, prescription sent to the pharmacy as well as a referral for proper education on this   MEDICATIONS: -Continue Lantus 36 units once daily  -Increase  Novolog to 16  units with each meal  -Continue Novolog 3 units with a snack  - Continue Ozempic 1 mg weekly  - Stop Farxiga 5 mg daily  - CF : Novolog ( BG-130/20)    EDUCATION / INSTRUCTIONS: BG monitoring instructions: Patient is instructed to check her blood sugars 4 times a day, before meals and bedtime . Call [facility] if: BG persistently < 70  I reviewed the Rule of 15 for the treatment of hypoglycemia in detail  with the patient. Literature supplied.    2) Diabetic complications:  Eye: Does not have known diabetic retinopathy.  Neuro/ Feet: Does have known diabetic peripheral neuropathy .  Renal: Patient does not have known baseline CKD. She   is  on an ACEI/ARB at present.    3) BMI 33:   - She continues to  be frustrated with her weight . BMI did not meet criteria for weight loss sx, or medications - I have reviewed postprandial hyperglycemia and how limiting the amount of start will improve her weight as well as glucose control, I also have advised to avoid snacks this will also have dual benefit to weight as well as glucose control        F/U in 3 months    Signed electronically by: Mack Guise, MD  [facility]  [facility] [address] [place], [place] [address] Phone: [phone] FAX: [phone]   CC: Samuel Bouche, [place] [place] [address] Phone: [phone]  Fax: [phone]  Return to Endocrinology clinic as below: Future Appointments  Date Time Provider [place]  05/18/2021  9:30 AM Marland Reine, Melanie Crazier, MD [id number] None  06/26/2021 11:00 AM Trula Slade, DPM [facility] None

## 2021-05-20 ENCOUNTER — Other Ambulatory Visit: Payer: Self-pay

## 2021-05-20 ENCOUNTER — Encounter: Payer: Self-pay | Admitting: Internal Medicine

## 2021-05-20 ENCOUNTER — Ambulatory Visit (INDEPENDENT_AMBULATORY_CARE_PROVIDER_SITE_OTHER): Payer: Managed Care, Other (non HMO) | Admitting: Internal Medicine

## 2021-05-20 VITALS — BP 126/82 | HR 109 | Ht 67.0 in | Wt 226.8 lb

## 2021-05-20 DIAGNOSIS — E1159 Type 2 diabetes mellitus with other circulatory complications: Secondary | ICD-10-CM | POA: Diagnosis not present

## 2021-05-20 DIAGNOSIS — Z794 Long term (current) use of insulin: Secondary | ICD-10-CM | POA: Diagnosis not present

## 2021-05-20 DIAGNOSIS — E1142 Type 2 diabetes mellitus with diabetic polyneuropathy: Secondary | ICD-10-CM | POA: Diagnosis not present

## 2021-05-20 DIAGNOSIS — E1165 Type 2 diabetes mellitus with hyperglycemia: Secondary | ICD-10-CM

## 2021-05-20 DIAGNOSIS — Z6833 Body mass index (BMI) 33.0-33.9, adult: Secondary | ICD-10-CM

## 2021-05-20 LAB — POCT GLYCOSYLATED HEMOGLOBIN (HGB A1C): Hemoglobin A1C: 10 % — AB (ref 4.0–5.6)

## 2021-05-20 MED ORDER — OZEMPIC (2 MG/DOSE) 8 MG/3ML ~~LOC~~ SOPN: 2.0000 mg | PEN_INJECTOR | SUBCUTANEOUS | 3 refills | Status: DC

## 2021-05-20 NOTE — Patient Instructions (Addendum)
-   Continue Lantus 36 units daily  - Change  Novolog 16 units with Breakfast and Lunch and 20 units with Supper - Novolog 3 units with a snack  - Increase Ozempic 2 mg weekly         HOW TO TREAT LOW BLOOD SUGARS (Blood sugar LESS THAN 70 MG/DL) Please follow the RULE OF 15 for the treatment of hypoglycemia treatment (when your (blood sugars are less than 70 mg/dL)   STEP 1: Take 15 grams of carbohydrates when your blood sugar is low, which includes:  3-4 GLUCOSE TABS  OR 3-4 OZ OF JUICE OR REGULAR SODA OR ONE TUBE OF GLUCOSE GEL    STEP 2: RECHECK blood sugar in 15 MINUTES STEP 3: If your blood sugar is still low at the 15 minute recheck --> then, go back to STEP 1 and treat AGAIN with another 15 grams of carbohydrates.

## 2021-05-20 NOTE — Progress Notes (Signed)
Name: Helen Perez  Age/ Sex: 55 y.o., female   MRN/ DOB: [id number], [birth year]     PCP: Samuel Bouche, NP   Reason for Endocrinology Evaluation: Type 2 Diabetes Mellitus  Initial Endocrine Consultative Visit: 01/30/2020    PATIENT IDENTIFIER: Helen Perez is a 55 y.o. female with a past medical history of HTN, TIA, OSA and T2DM. The patient has followed with Endocrinology clinic since 01/30/2020 for consultative assistance with management of her diabetes.  DIABETIC HISTORY:  Helen Perez was diagnosed with T2DM in 2000. She reports intolerance to Metformin.  She has been on an insulin pump for years.  Her hemoglobin A1c has ranged from 10.1% in 12/10/2019, peaking at 15.7% in 2016.  On her initial visit to our clinic her A1c was 12.5% , she was on Ozempic and novolog through insulin pump. We did not make any changes as she  Uses the pump only periodically as well as the Decox. Chronic hx of non-compliance, and was given the option to switch to MDI vs using the pump properly , she opted to continue with the pump at the time.     Works 3 pm to 2:30 Am , goes to bed at 4:30 , wakes up at 10- 11 Am   By 04/2020 we stopped insulin pump and started MDI regimen   Farxiga started 10/2020 and stopped by 02/2021 due to worsening genital infections   SUBJECTIVE:   During the last visit (02/16/2021): A1c 9.5% . Continued Ozempic , MDI regimen , stopped farxiga     Today (05/20/2021): Helen Perez is here for a follow up on diabetes  She checks her blood sugars multiple  times daily, through the dexcom. The patient has not had hypoglycemic episodes since the last clinic visit.  Denies nausea or diarrhea  She continues to complain about weight gain , I have tried to refer her to surgical weight management but did not qualify. She admits to having difficulty with portion control    Has knee pain  Pending left foot sx   HOME DIABETES REGIMEN:  - Lantus 36 units once daily  - Novolog 16 units with  each meal - Ozempic 1 mg weekly  - Novolog (BG-130/20)     Statin: Yes ACE-I/ARB: yes     CONTINUOUS GLUCOSE MONITORING RECORD INTERPRETATION    Dates of Recording:7/21-05/20/2021   Sensor description:Dexcom  Results statistics:   CGM use % of time 86%  Average and SD 258/63  Time in range   9%  % Time Above 180 42  % Time above 250 49  % Time Below target 0       Glycemic patterns summary: Hyperglycemia during the day and night   Hyperglycemic episodes  postprandial  Hypoglycemic episodes occurred none  Overnight periods: high but trends down     INpen Report : Average 256 mg/dL  Missed doses : 34 SD 57 mg/dL       DIABETIC COMPLICATIONS: Microvascular complications:  Neuropathy Denies: retinopathy,  CKD Last eye exam: Completed 2021   Macrovascular complications:  CVA Denies: CAD, PVD    HISTORY:  Past Medical History:  Past Medical History:  Diagnosis Date   Anxiety    Depression    Diabetes mellitus without complication ([place])    H/O syphilis    HSV-2 infection    Hypertension associated with diabetes ([place]) 03/30/2018   Renal insufficiency 10/13/2018   Sleep apnea    CPAP   Stroke [facility])    Past Surgical  History:  Past Surgical History:  Procedure Laterality Date   ABDOMINAL HYSTERECTOMY     FOOT SURGERY Right    Social History:  reports that she quit smoking about 8 years ago. Her smoking use included cigarettes. She has never used smokeless tobacco. She reports current alcohol use. She reports that she does not use drugs. Family History:  Family History  Problem Relation Age of Onset   Diabetes Mother    Heart disease Mother    Heart failure Mother    Diabetes Sister    Kidney failure Sister    Other Brother        quadraplegia   Diabetes Maternal Grandmother    Diabetes Brother    Kidney failure Brother    Diabetes Brother    Kidney failure Father    Colon cancer Neg Hx    Esophageal cancer Neg Hx    Pancreatic  cancer Neg Hx    Stomach cancer Neg Hx    Liver disease Neg Hx      HOME MEDICATIONS: Allergies as of 05/20/2021       Reactions   Adhesive [tape]    From dexcom   Latex Itching, Swelling   Other Dermatitis   FREESTYLE LIBRE SENSOR- cellulitis, wound on skin         Medication List        Accurate as of May 20, 2021  1:20 PM. If you have any questions, ask your nurse or doctor.          acetaminophen 650 MG CR tablet Commonly known as: TYLENOL Take 1 tablet by mouth as needed.   acyclovir ointment 5 % Commonly known as: ZOVIRAX Apply topically.   amitriptyline 25 MG tablet Commonly known as: ELAVIL Take 1 tablet (25 mg total) by mouth at bedtime.   aspirin EC 81 MG tablet Take 1 tablet by mouth daily.   atorvastatin 40 MG tablet Commonly known as: LIPITOR Take 1 tablet (40 mg total) by mouth daily.   Dexcom G6 Receiver Devi Use instructed to check blood sugar daily   Dexcom G6 Sensor Misc INJECT 1 DEVICE INTO THE SKIN AS DIRECTED EVERY 10 DAYS   Dexcom G6 Transmitter Misc 1 Package by Does not apply route every 3 (three) months.   escitalopram 20 MG tablet Commonly known as: LEXAPRO Take 1 tablet by mouth daily.   fexofenadine-pseudoephedrine 180-240 MG 24 hr tablet Commonly known as: ALLEGRA-D 24 Take 1 tablet by mouth daily as needed.   fluconazole 150 MG tablet Commonly known as: DIFLUCAN Take 150 mg by mouth once.   furosemide 20 MG tablet Commonly known as: LASIX Take 1 tablet (20 mg total) by mouth daily. If significant swelling that is not responding to elevation and compression, take 1 extra tablet daily no more than 3 times weekly.   insulin aspart cartridge Commonly known as: NOVOLOG Max daily 80 units   Insulin Pen Needle 29G X 5MM Misc 1 Device by Does not apply route as directed.   lisinopril 5 MG tablet Commonly known as: ZESTRIL Take 1 tablet by mouth daily.   meloxicam 7.5 MG tablet Commonly known as: MOBIC Take by  mouth.   mupirocin ointment 2 % Commonly known as: BACTROBAN Apply 1 application topically. 2-3 times daily   Naltrexone-buPROPion HCl ER 8-90 MG Tb12 1 tab daily for week 1, then 1 tab BID for week 2, then 2 tab PO qAM and 1 tab PO qPM for week 3, then 2 tabs BID.  NONFORMULARY OR COMPOUNDED ITEM Antifungal solution: Terbinafine 3%, Fluconazole 2%, Tea Tree Oil 5%, Urea 10%, Ibuprofen 2% in DMSO suspension #68m   omeprazole 20 MG capsule Commonly known as: PRILOSEC Take 1 capsule by mouth daily.   Ozempic (1 MG/DOSE) 4 MG/3ML Sopn Generic drug: Semaglutide (1 MG/DOSE) DIAL AND INJECT UNDER THE SKIN 1 MG WEEKLY   prednisoLONE acetate 1 % ophthalmic suspension Commonly known as: PRED FORTE SMARTSIG:In Eye(s)   pregabalin 150 MG capsule Commonly known as: LYRICA Take 1 capsule (150 mg total) by mouth 2 (two) times daily.   senna-docusate 8.6-50 MG tablet Commonly known as: Senokot-S Take by mouth.   Skin Tac Adhesive Barrier Wipe Misc 1 applicator by Does not apply route as directed.   Toujeo Max SoloStar 300 UNIT/ML Solostar Pen Generic drug: insulin glargine (2 Unit Dial) Inject 36 Units into the skin daily.   triamcinolone 55 MCG/ACT Aero nasal inhaler Commonly known as: NASACORT Place 2 sprays into the nose daily.   Trulance 3 MG Tabs Generic drug: Plecanatide Take 1 tablet by mouth daily as needed.   valACYclovir 500 MG tablet Commonly known as: VALTREX Take 1 tablet (500 mg total) by mouth daily.   zolpidem 12.5 MG CR tablet Commonly known as: AMBIEN CR TAKE ONE TABLET BY MOUTH EVERY NIGHT AT BEDTIME AS NEEDED FOR SLEEP         OBJECTIVE:   Vital Signs: BP 126/82   Pulse (!) 109   Ht '5\' 7"'$  (1.702 m)   Wt 226 lb 12.8 oz (102.9 kg)   SpO2 94%   BMI 35.52 kg/m   Wt Readings from Last 3 Encounters:  05/20/21 226 lb 12.8 oz (102.9 kg)  04/30/21 225 lb (102.1 kg)  02/16/21 216 lb 4 oz (98.1 kg)     Exam: General: Helen Perez appears well and is in  NAD  Lungs: Clear with good BS bilat with no rales, rhonchi, or wheezes  Heart: RRR   Abdomen: Normoactive bowel sounds, soft, nontender  Extremities: No pretibial edema.  Neuro: MS is good with appropriate affect, Helen Perez is alert and Ox3    DM foot exam: 01/30/2020   The skin of the feet is intact without sores or ulcerations. The pedal pulses are 2+ on right and 2+ on left. The sensation is decreased  to a screening 5.07, 10 gram monofilament bilaterally    DATA REVIEWED:  Lab Results  Component Value Date   HGBA1C 10.0 (A) 05/20/2021   HGBA1C 9.5 (A) 02/16/2021   HGBA1C 9.5 (A) 11/17/2020   Lab Results  Component Value Date   MICROALBUR 38.5 (H) 01/30/2020   LDLCALC 58 02/04/2021   CREATININE 1.18 (H) 02/04/2021   Lab Results  Component Value Date   MICRALBCREAT 59.1 (H) 01/30/2020     Lab Results  Component Value Date   CHOL 129 02/04/2021   HDL 52 02/04/2021   LDLCALC 58 02/04/2021   TRIG 108 02/04/2021   CHOLHDL 2.5 02/04/2021         ASSESSMENT / PLAN / RECOMMENDATIONS:   1) Type 2 Diabetes Mellitus, Poorly controlled, With neuropathic and macrovascular  complications - Most recent A1c of 10.0 %. Goal A1c < 7.0 %.     - Continues with medication non-adherence and dietary indiscretions.  - we have reviewed her inpen report which shows 34 missed doses of insulin in the past 2 weeks, there are also instances where she snacks but does not bolus for that, we reviewed all this   -  Intolerant to [place] due to recurrent genital infections  - Will increase Ozempic    MEDICATIONS: -Continue Lantus 36 units once daily  -Change Novolog to 16  units with Breakfast and Lunch and 20 units with Supper  - Continue Novolog 3 units with a snack  - Increase Ozempic 2 mg weekly  - CF : Novolog ( BG-130/20)    EDUCATION / INSTRUCTIONS: BG monitoring instructions: Patient is instructed to check her blood sugars 4 times a day, before meals and bedtime . Call [place]  [facility] clinic if: BG persistently < 70  I reviewed the Rule of 15 for the treatment of hypoglycemia in detail with the patient. Literature supplied.    2) Diabetic complications:  Eye: Does not have known diabetic retinopathy.  Neuro/ Feet: Does have known diabetic peripheral neuropathy .  Renal: Patient does not have known baseline CKD. She   is  on an ACEI/ARB at present.     3) Weight gain :   - She continues with weight gain and difficult with impulse control. Will refer her for medical weight management    F/U in 3 months    Signed electronically by: Mack Guise, MD  [facility] End[facility]  [facility] [address] [place], [place] [address] Phone: [phone] FAX: [phone]   CC: Samuel Bouche, [place] [employer] [place] [address] Phone: [phone]  Fax: [phone]  Return to Endocrinology clinic as below: Future Appointments  Date Time Provider [place]  06/26/2021 11:00 AM Trula Slade, DPM [facility] None

## 2021-05-26 ENCOUNTER — Ambulatory Visit (INDEPENDENT_AMBULATORY_CARE_PROVIDER_SITE_OTHER): Payer: Managed Care, Other (non HMO) | Admitting: Medical-Surgical

## 2021-05-26 ENCOUNTER — Encounter: Payer: Self-pay | Admitting: Medical-Surgical

## 2021-05-26 VITALS — BP 132/80 | HR 115 | Resp 20 | Wt 226.7 lb

## 2021-05-26 DIAGNOSIS — Z794 Long term (current) use of insulin: Secondary | ICD-10-CM | POA: Diagnosis not present

## 2021-05-26 DIAGNOSIS — E1165 Type 2 diabetes mellitus with hyperglycemia: Secondary | ICD-10-CM

## 2021-05-26 DIAGNOSIS — B3731 Acute candidiasis of vulva and vagina: Secondary | ICD-10-CM

## 2021-05-26 DIAGNOSIS — R21 Rash and other nonspecific skin eruption: Secondary | ICD-10-CM | POA: Diagnosis not present

## 2021-05-26 DIAGNOSIS — B373 Candidiasis of vulva and vagina: Secondary | ICD-10-CM

## 2021-05-26 DIAGNOSIS — Z6835 Body mass index (BMI) 35.0-35.9, adult: Secondary | ICD-10-CM

## 2021-05-26 DIAGNOSIS — F32A Depression, unspecified: Secondary | ICD-10-CM | POA: Diagnosis not present

## 2021-05-26 LAB — POCT UA - MICROALBUMIN

## 2021-05-26 MED ORDER — BUPROPION HCL ER (XL) 150 MG PO TB24: ORAL_TABLET | ORAL | 1 refills | Status: DC

## 2021-05-26 MED ORDER — TRIAMCINOLONE ACETONIDE 0.5 % EX CREA: 1.0000 "application " | TOPICAL_CREAM | Freq: Two times a day (BID) | CUTANEOUS | 3 refills | Status: DC

## 2021-05-26 MED ORDER — FLUCONAZOLE 150 MG PO TABS: 150.0000 mg | ORAL_TABLET | Freq: Once | ORAL | 0 refills | Status: AC

## 2021-05-26 NOTE — Progress Notes (Signed)
HPI with pertinent ROS:   CC: Rash on arms  HPI: Pleasant 55 year old female presenting today for evaluation of a rash on her right forearm.  The rash developed approximately 1 month ago after a visit to [place].  Notes that the area on her right forearm is very itchy and bothersome.  She has tried using cortisone 10 but it was not helpful.  No new exposures to chemicals, cosmetics, detergents, environmental agents, pets, medications, or foods.  She continues to be quite concerned about her weight.  Notes that her weight is at an all-time high and she is very limited in her ability to be mobile.  Admits that she has trouble pushing away from the table and often finds her self snacking on foods that are less than healthy for a diabetic.  Currently admits to using food as comfort.  She does have significant issues with depression due to limited mobility, weight concerns, uncontrolled diabetes, and chronic pain with her foot injuries.  She is currently using Ozempic 2 mg weekly but this has not been helpful to help her lose weight.  Insurance did not cover Contrave and there is concern for use of phentermine in a diabetic patient currently treated with polypharmacy.  Having thick white vaginal discharge and vaginal irritation.  Blood sugars have been elevated and she does get recurrent vulvovaginal yeast infections.  Would like to have Diflucan sent in today.  I reviewed the past medical history, family history, social history, surgical history, and allergies today and no changes were needed.  Please see the problem list section below in epic for further details.   Physical exam:   General: Well Developed, well nourished, and in no acute distress.  Neuro: Alert and oriented x3.  HEENT: Normocephalic, atraumatic.  Skin: Warm and dry.  Small slightly erythematous lesions scattered to the lateral right forearm.  No pustules, vesicles, papules, or edema. Cardiac: Regular rate and rhythm, no  murmurs rubs or gallops, no lower extremity edema.  Respiratory: Clear to auscultation bilaterally. Not using accessory muscles, speaking in full sentences.  Impression and Recommendations:    1. Rash Unclear etiology.  Since this has been there for a month and not responsive to low-dose steroids, sending in triamcinolone cream to use sparingly twice a day for the next 7 to 14 days. - triamcinolone cream (KENALOG) 0.5 %; Apply 1 application topically 2 (two) times daily. To affected areas.  Dispense: 30 g; Refill: 3  2. Depression, unspecified depression type Referring to behavioral health for counseling.  Continue current amitriptyline 25 mg nightly and Lexapro 20 mg daily. - Ambulatory referral to [place]  3. Class 2 severe obesity due to excess calories with serious comorbidity and body mass index (BMI) of 35.0 to 35.9 in adult [facility]) We have discussed several different weight loss options but her chronic conditions and current medications make this tricky.  Contrave was not covered by her insurance and Ozempic has been unhelpful even at the 2 mg dose.  Starting Wellbutrin at 150 mg daily for 7 days and increase to 300 mg daily.  Advised that this may increase anxiety over the first 2 weeks but this should resolve on its own. - buPROPion (WELLBUTRIN XL) 150 MG 24 hr tablet; Take 1 tablet (150 mg total) by mouth daily for 7 days, THEN 2 tablets (300 mg total) daily for 23 days.  Dispense: 53 tablet; Refill: 1  4. 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  Sending in Whitney  150 mg x 1. - fluconazole (DIFLUCAN) 150 MG tablet; Take 1 tablet (150 mg total) by mouth once for 1 dose.  Dispense: 1 tablet; Refill: 0  Return in about 4 weeks (around 06/23/2021) for mood/weight follow up. ___________________________________________ Clearnce Sorrel, DNP, APRN, FNP-BC Primary Care and [place]

## 2021-05-27 ENCOUNTER — Other Ambulatory Visit: Payer: Self-pay | Admitting: Internal Medicine

## 2021-05-27 ENCOUNTER — Other Ambulatory Visit: Payer: Self-pay | Admitting: Medical-Surgical

## 2021-05-27 NOTE — Telephone Encounter (Signed)
Ok for refill? 

## 2021-06-23 ENCOUNTER — Ambulatory Visit: Payer: Managed Care, Other (non HMO) | Admitting: Medical-Surgical

## 2021-06-26 ENCOUNTER — Encounter: Payer: Self-pay | Admitting: Podiatry

## 2021-06-26 ENCOUNTER — Other Ambulatory Visit: Payer: Self-pay

## 2021-06-26 ENCOUNTER — Ambulatory Visit (INDEPENDENT_AMBULATORY_CARE_PROVIDER_SITE_OTHER): Payer: Managed Care, Other (non HMO)

## 2021-06-26 ENCOUNTER — Ambulatory Visit (INDEPENDENT_AMBULATORY_CARE_PROVIDER_SITE_OTHER): Payer: Managed Care, Other (non HMO) | Admitting: Podiatry

## 2021-06-26 DIAGNOSIS — S92512D Displaced fracture of proximal phalanx of left lesser toe(s), subsequent encounter for fracture with routine healing: Secondary | ICD-10-CM

## 2021-07-01 NOTE — Progress Notes (Signed)
Subjective: 55 year old female presents the office today for follow-up evaluation of fractures to her left foot.  She states that the swelling to the toes is improving not having significant pain.  She states that she does get intermittent swelling to the foot she wears compression socks.  She is continuing to work as well.  She denies any skin breakdown or ulcerations.  No recent injury or trauma.    Objective: AAO x3, NAD DP/PT pulses palpable bilaterally, CRT less than 3 seconds Sensation decreased with Semmes Weinstein monofilament There is no significant discomfort on Lisfranc joint left foot today.  There are some chronic edema present of the left foot.  The toes are in rectus position there is decreased edema to the digit.  There is no erythema, skin breakdown or preulcerative calluses.  No significant discomfort in the right foot today.   Hammertoes present. No pain with calf compression, swelling, warmth, erythema  Assessment: Displaced toe fractures left third and fourth digits; hammertoes; neuropathy  Plan: -All treatment options discussed with the patient including all alternatives, risks, complications.  -Repeat x-rays obtained and independently reviewed of the left foot at the time of the patient's appointment.  Fractures are still evident with some callus formation.  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  Also discussed Charcot again today as well.  -At this point continue with compression topically swelling.  Not significant pain in the foot and no significant collapse present.  Working to if needed but otherwise continue with supportive shoe gear.  Discussed surgical management given her uncontrolled diabetes we will hold off on that for now.  Trula Slade DPM

## 2021-07-02 ENCOUNTER — Other Ambulatory Visit: Payer: Self-pay | Admitting: Internal Medicine

## 2021-07-10 ENCOUNTER — Other Ambulatory Visit: Payer: Self-pay

## 2021-07-10 ENCOUNTER — Ambulatory Visit (INDEPENDENT_AMBULATORY_CARE_PROVIDER_SITE_OTHER): Payer: Managed Care, Other (non HMO) | Admitting: Internal Medicine

## 2021-07-10 ENCOUNTER — Encounter: Payer: Self-pay | Admitting: Internal Medicine

## 2021-07-10 VITALS — BP 124/80 | HR 100 | Ht 67.0 in | Wt 224.0 lb

## 2021-07-10 DIAGNOSIS — E1142 Type 2 diabetes mellitus with diabetic polyneuropathy: Secondary | ICD-10-CM

## 2021-07-10 DIAGNOSIS — E1159 Type 2 diabetes mellitus with other circulatory complications: Secondary | ICD-10-CM

## 2021-07-10 DIAGNOSIS — Z794 Long term (current) use of insulin: Secondary | ICD-10-CM | POA: Diagnosis not present

## 2021-07-10 DIAGNOSIS — E1165 Type 2 diabetes mellitus with hyperglycemia: Secondary | ICD-10-CM | POA: Diagnosis not present

## 2021-07-10 LAB — POCT GLYCOSYLATED HEMOGLOBIN (HGB A1C): Hemoglobin A1C: 8.6 % — AB (ref 4.0–5.6)

## 2021-07-10 MED ORDER — TOUJEO MAX SOLOSTAR 300 UNIT/ML ~~LOC~~ SOPN: 34.0000 [IU] | PEN_INJECTOR | Freq: Every day | SUBCUTANEOUS | 3 refills | Status: DC

## 2021-07-10 NOTE — Progress Notes (Signed)
Name: Helen Perez  Age/ Sex: 55 y.o., female   MRN/ DOB: [id number], 10-04-1966     PCP: Samuel Bouche, NP   Reason for Endocrinology Evaluation: Type 2 Diabetes Mellitus  Initial Endocrine Consultative Visit: 01/30/2020    PATIENT IDENTIFIER: Helen Perez is a 55 y.o. female with a past medical history of HTN, TIA, OSA and T2DM. The patient has followed with Endocrinology clinic since 01/30/2020 for consultative assistance with management of her diabetes.  DIABETIC HISTORY:  Helen Perez was diagnosed with T2DM in 2000. She reports intolerance to Metformin.  She has been on an insulin pump for years.  Her hemoglobin A1c has ranged from 10.1% in 12/10/2019, peaking at 15.7% in 2016.  On her initial visit to our clinic her A1c was 12.5% , she was on Ozempic and novolog through insulin pump. We did not make any changes as she  Uses the pump only periodically as well as the Decox. Chronic hx of non-compliance, and was given the option to switch to MDI vs using the pump properly , she opted to continue with the pump at the time.     By 04/2020 we stopped insulin pump and started MDI regimen   Farxiga started 10/2020 and stopped by 02/2021 due to worsening genital infections   SUBJECTIVE:   During the last visit (05/20/2021): A1c 10.0% . Increased  Ozempic ,continued  MDI regimen     Today (07/10/2021): Helen Perez is here for a follow up on diabetes  She checks her blood sugars multiple  times daily, through the dexcom. The patient has not had hypoglycemic episodes since the last clinic visit.  Denies nausea or diarrhea  Pending Left knee sx - Dr. Berenice Primas'   Eats 2 meals a day (2 pm and 7:30 pm) Work hours 4:30 pm -2:15 AM  Sleep 3 AM to 11 AM    Saw Dr. Jacqualyn Posey 06/26/2021 she has toe fractures   HOME DIABETES REGIMEN:  - Toujeo  36 units once daily  - Novolog 16 units with Breakfast and Lunch and 20 units with Supper  - Ozempic 2 mg weekly  - Novolog (BG-130/20)     Statin:  Yes ACE-I/ARB: yes     CONTINUOUS GLUCOSE MONITORING RECORD INTERPRETATION    Dates of Recording:9/10-9/23/2022   Sensor description:Dexcom  Results statistics:   CGM use % of time 86%  Average and SD 184/66  Time in range  54  % Time Above 180 26  % Time above 250 18  % Time Below target 1       Glycemic patterns summary: Bg's optimal during the night, hyperglycemia noted during the day postprandial   Hyperglycemic episodes  postprandial  Hypoglycemic episodes occurred followung a bolus and at night   Overnight periods: trends down     INpen Report :9/9-9/22/2022 Average 180 mg/dL  Missed doses : 35 SD 68  mg/dL       DIABETIC COMPLICATIONS: Microvascular complications:  Neuropathy Denies: retinopathy,  CKD Last eye exam: Completed 2021   Macrovascular complications:  CVA Denies: CAD, PVD    HISTORY:  Past Medical History:  Past Medical History:  Diagnosis Date   Anxiety    Depression    Diabetes mellitus without complication ([place])    H/O syphilis    HSV-2 infection    Hypertension associated with diabetes ([place]) 03/30/2018   Renal insufficiency 10/13/2018   Sleep apnea    CPAP   Stroke [facility])    Past Surgical History:  Past  Surgical History:  Procedure Laterality Date   ABDOMINAL HYSTERECTOMY     FOOT SURGERY Right    Social History:  reports that she quit smoking about 8 years ago. Her smoking use included cigarettes. She has never used smokeless tobacco. She reports current alcohol use. She reports that she does not use drugs. Family History:  Family History  Problem Relation Age of Onset   Diabetes Mother    Heart disease Mother    Heart failure Mother    Diabetes Sister    Kidney failure Sister    Other Brother        quadraplegia   Diabetes Maternal Grandmother    Diabetes Brother    Kidney failure Brother    Diabetes Brother    Kidney failure Father    Colon cancer Neg Hx    Esophageal cancer Neg Hx    Pancreatic  cancer Neg Hx    Stomach cancer Neg Hx    Liver disease Neg Hx      HOME MEDICATIONS: Allergies as of 07/10/2021       Reactions   Adhesive [tape]    From dexcom   Latex Itching, Swelling   Other Dermatitis   FREESTYLE LIBRE SENSOR- cellulitis, wound on skin         Medication List        Accurate as of July 10, 2021  2:53 PM. If you have any questions, ask your nurse or doctor.          acetaminophen 650 MG CR tablet Commonly known as: TYLENOL Take 1 tablet by mouth as needed.   acyclovir ointment 5 % Commonly known as: ZOVIRAX Apply topically.   amitriptyline 25 MG tablet Commonly known as: ELAVIL Take 1 tablet (25 mg total) by mouth at bedtime.   aspirin EC 81 MG tablet Take 1 tablet by mouth daily.   atorvastatin 40 MG tablet Commonly known as: LIPITOR Take 1 tablet (40 mg total) by mouth daily.   buPROPion 150 MG 24 hr tablet Commonly known as: Wellbutrin XL Take 1 tablet (150 mg total) by mouth daily for 7 days, THEN 2 tablets (300 mg total) daily for 23 days. Start taking on: May 26, 2021   Dexcom G6 Receiver Kerrin Mo Use instructed to check blood sugar daily   Dexcom G6 Sensor Misc INJECT 1 DEVICE INTO THE SKIN AS DIRECTED EVERY 10 DAYS   Dexcom G6 Transmitter Misc 1 EVERY 3 MONTHS   escitalopram 20 MG tablet Commonly known as: LEXAPRO Take 1 tablet by mouth daily.   fexofenadine-pseudoephedrine 180-240 MG 24 hr tablet Commonly known as: ALLEGRA-D 24 Take 1 tablet by mouth daily as needed.   fluconazole 150 MG tablet Commonly known as: DIFLUCAN Take 150 mg by mouth once.   furosemide 20 MG tablet Commonly known as: LASIX Take 1 tablet (20 mg total) by mouth daily. If significant swelling that is not responding to elevation and compression, take 1 extra tablet daily no more than 3 times weekly.   insulin aspart cartridge Commonly known as: NOVOLOG Max daily 80 units   Insulin Pen Needle 29G X 5MM Misc 1 Device by Does not  apply route as directed.   lisinopril 5 MG tablet Commonly known as: ZESTRIL Take 1 tablet by mouth daily.   meloxicam 7.5 MG tablet Commonly known as: MOBIC Take by mouth.   mupirocin ointment 2 % Commonly known as: BACTROBAN Apply 1 application topically. 2-3 times daily   NONFORMULARY OR COMPOUNDED ITEM Antifungal  solution: Terbinafine 3%, Fluconazole 2%, Tea Tree Oil 5%, Urea 10%, Ibuprofen 2% in DMSO suspension #63mL   omeprazole 20 MG capsule Commonly known as: PRILOSEC Take 1 capsule by mouth daily.   Ozempic (2 MG/DOSE) 8 MG/3ML Sopn Generic drug: Semaglutide (2 MG/DOSE) Inject 2 mg into the skin once a week.   prednisoLONE acetate 1 % ophthalmic suspension Commonly known as: PRED FORTE SMARTSIG:In Eye(s)   pregabalin 150 MG capsule Commonly known as: LYRICA Take 1 capsule (150 mg total) by mouth 2 (two) times daily.   senna-docusate 8.6-50 MG tablet Commonly known as: Senokot-S Take by mouth.   Skin Tac Adhesive Barrier Wipe Misc 1 applicator by Does not apply route as directed.   Toujeo Max SoloStar 300 UNIT/ML Solostar Pen Generic drug: insulin glargine (2 Unit Dial) Inject 36 Units into the skin daily.   triamcinolone 55 MCG/ACT Aero nasal inhaler Commonly known as: NASACORT Place 2 sprays into the nose daily.   triamcinolone cream 0.5 % Commonly known as: KENALOG Apply 1 application topically 2 (two) times daily. To affected areas.   Trulance 3 MG Tabs Generic drug: Plecanatide Take 1 tablet by mouth daily as needed.   valACYclovir 500 MG tablet Commonly known as: VALTREX Take 1 tablet (500 mg total) by mouth daily.   zolpidem 12.5 MG CR tablet Commonly known as: AMBIEN CR TAKE ONE TABLET BY MOUTH EVERY NIGHT AT BEDTIME AS NEEDED FOR SLEEP         OBJECTIVE:   Vital Signs: BP 124/80 (BP Location: Left Arm, Patient Position: Sitting, Cuff Size: Small)   Pulse 100   Ht 5\' 7"  (1.702 m)   Wt 224 lb (101.6 kg)   SpO2 99%   BMI 35.08  kg/m   Wt Readings from Last 3 Encounters:  07/10/21 224 lb (101.6 kg)  05/26/21 226 lb 11.2 oz (102.8 kg)  05/20/21 226 lb 12.8 oz (102.9 kg)     Exam: General: Pt appears well and is in NAD  Lungs: Clear with good BS bilat with no rales, rhonchi, or wheezes  Heart: RRR   Abdomen: Normoactive bowel sounds, soft, nontender  Extremities: No pretibial edema.  Neuro: MS is good with appropriate affect, pt is alert and Ox3    DM foot exam: 01/30/2020   The skin of the feet is intact without sores or ulcerations. The pedal pulses are 2+ on right and 2+ on left. The sensation is decreased  to a screening 5.07, 10 gram monofilament bilaterally    DATA REVIEWED:  Lab Results  Component Value Date   HGBA1C 8.6 (A) 07/10/2021   HGBA1C 10.0 (A) 05/20/2021   HGBA1C 9.5 (A) 02/16/2021   Lab Results  Component Value Date   MICROALBUR 150mg  05/26/2021   LDLCALC 58 02/04/2021   CREATININE 1.18 (H) 02/04/2021   Lab Results  Component Value Date   MICRALBCREAT 30-300mg /g 05/26/2021     Lab Results  Component Value Date   CHOL 129 02/04/2021   HDL 52 02/04/2021   LDLCALC 58 02/04/2021   TRIG 108 02/04/2021   CHOLHDL 2.5 02/04/2021         ASSESSMENT / PLAN / RECOMMENDATIONS:   1) Type 2 Diabetes Mellitus, with improving glycemic control, With neuropathic and macrovascular  complications - Most recent A1c of 8.6  %. Goal A1c < 7.0 %.    -Her A1c is down from 10.0%, I have praised the patient on improved glycemic control -She is motivated to improve her glycemic control so  she can have left knee surgery - Intolerant to [place] due to recurrent genital infections  -She is tolerating the Ozempic without any side effects, but we discussed once she is done with her surgery I will switch her to [facility], due to better weight loss benefits -She has been noted with hypoglycemia during the day, this is attributed to insulin stacking, the patient would bolus for a meal within 2  hours, patient has been advised to separate meals by 3-4 hours  MEDICATIONS: -Decrease Toujeo 34 units once daily  -Change Novolog to 20 units with Breakfast, 20 units with lunch and 22 units with Supper  -Increase Novolog for units with a snack  -Continue Ozempic 2 mg weekly  -Change correction factor : Novolog ( BG-135/15)    EDUCATION / INSTRUCTIONS: BG monitoring instructions: Patient is instructed to check her blood sugars 4 times a day, before meals and bedtime . Call [place] [facility] clinic if: BG persistently < 70  I reviewed the Rule of 15 for the treatment of hypoglycemia in detail with the patient. Literature supplied.    2) Diabetic complications:  Eye: Does not have known diabetic retinopathy.  Neuro/ Feet: Does have known diabetic peripheral neuropathy .  Renal: Patient does not have known baseline CKD. She   is  on an ACEI/ARB at present.     3) Weight gain :   - She continues with weight gain and difficult with impulse control.  I have referred her to our medical weight management clinic but no response yet, will follow up on this -She will be a good candidate for Mounjaro, but we will wait until she is done with her knee surgery    F/U in 3 months    Signed electronically by: Mack Guise, MD  [facility]  [facility] [address] [place], [place] [address] Phone: [phone] FAX: [phone]   CC: Samuel Bouche, [place] [place] [address] Phone: [phone]  Fax: [phone]  Return to Endocrinology clinic as below: No future appointments.

## 2021-07-10 NOTE — Patient Instructions (Addendum)
-   Decrease  Toujeo 34 units daily  - Change  Novolog 20 units with Breakfast and 20 units Lunch and 22 units with Supper - Novolog 4 units with a snack  - Continue Ozempic 2 mg weekly         HOW TO TREAT LOW BLOOD SUGARS (Blood sugar LESS THAN 70 MG/DL) Please follow the RULE OF 15 for the treatment of hypoglycemia treatment (when your (blood sugars are less than 70 mg/dL)   STEP 1: Take 15 grams of carbohydrates when your blood sugar is low, which includes:  3-4 GLUCOSE TABS  OR 3-4 OZ OF JUICE OR REGULAR SODA OR ONE TUBE OF GLUCOSE GEL    STEP 2: RECHECK blood sugar in 15 MINUTES STEP 3: If your blood sugar is still low at the 15 minute recheck --> then, go back to STEP 1 and treat AGAIN with another 15 grams of carbohydrates.

## 2021-07-21 ENCOUNTER — Telehealth: Payer: Self-pay | Admitting: Internal Medicine

## 2021-07-21 MED ORDER — INPEN 100-BLUE-NOVOLOG-FIASP DEVI: 0 refills | Status: DC

## 2021-07-21 NOTE — Telephone Encounter (Signed)
Script has been sent and patient notified

## 2021-07-21 NOTE — Telephone Encounter (Signed)
Patient requests to be called at ph# [phone] re: Patient lost her InPen and wants to know what to do about it  Patient's PHARM is: [id number] - [place], [place] Phone:  [phone]  Fax:  [phone]

## 2021-07-23 ENCOUNTER — Other Ambulatory Visit: Payer: Self-pay | Admitting: Medical-Surgical

## 2021-07-25 ENCOUNTER — Other Ambulatory Visit: Payer: Self-pay | Admitting: Internal Medicine

## 2021-07-26 ENCOUNTER — Other Ambulatory Visit: Payer: Self-pay | Admitting: Medical-Surgical

## 2021-08-03 ENCOUNTER — Other Ambulatory Visit: Payer: Self-pay | Admitting: Medical-Surgical

## 2021-08-14 ENCOUNTER — Ambulatory Visit (INDEPENDENT_AMBULATORY_CARE_PROVIDER_SITE_OTHER): Payer: Managed Care, Other (non HMO)

## 2021-08-14 ENCOUNTER — Other Ambulatory Visit: Payer: Self-pay

## 2021-08-14 DIAGNOSIS — Z794 Long term (current) use of insulin: Secondary | ICD-10-CM | POA: Diagnosis not present

## 2021-08-14 DIAGNOSIS — E1165 Type 2 diabetes mellitus with hyperglycemia: Secondary | ICD-10-CM

## 2021-08-14 LAB — POCT GLYCOSYLATED HEMOGLOBIN (HGB A1C): Hemoglobin A1C: 8.1 % — AB (ref 4.0–5.6)

## 2021-08-14 NOTE — Progress Notes (Addendum)
Patient was advised that A1c was 8.1 and still probably to high for surgery. Patient will notify surgeon.

## 2021-08-21 ENCOUNTER — Other Ambulatory Visit: Payer: Self-pay | Admitting: Medical-Surgical

## 2021-08-21 DIAGNOSIS — Z6835 Body mass index (BMI) 35.0-35.9, adult: Secondary | ICD-10-CM

## 2021-08-22 ENCOUNTER — Other Ambulatory Visit: Payer: Self-pay | Admitting: Medical-Surgical

## 2021-08-23 ENCOUNTER — Other Ambulatory Visit: Payer: Self-pay | Admitting: Internal Medicine

## 2021-08-24 ENCOUNTER — Ambulatory Visit: Payer: Managed Care, Other (non HMO) | Admitting: Internal Medicine

## 2021-08-29 ENCOUNTER — Other Ambulatory Visit: Payer: Self-pay | Admitting: Medical-Surgical

## 2021-08-31 ENCOUNTER — Encounter: Payer: Self-pay | Admitting: Medical-Surgical

## 2021-08-31 ENCOUNTER — Ambulatory Visit (INDEPENDENT_AMBULATORY_CARE_PROVIDER_SITE_OTHER): Payer: Managed Care, Other (non HMO) | Admitting: Medical-Surgical

## 2021-08-31 VITALS — BP 126/70 | HR 108 | Resp 20 | Ht 67.0 in | Wt 220.0 lb

## 2021-08-31 DIAGNOSIS — E1159 Type 2 diabetes mellitus with other circulatory complications: Secondary | ICD-10-CM

## 2021-08-31 DIAGNOSIS — N898 Other specified noninflammatory disorders of vagina: Secondary | ICD-10-CM | POA: Diagnosis not present

## 2021-08-31 DIAGNOSIS — Z760 Encounter for issue of repeat prescription: Secondary | ICD-10-CM | POA: Diagnosis not present

## 2021-08-31 DIAGNOSIS — G4700 Insomnia, unspecified: Secondary | ICD-10-CM

## 2021-08-31 DIAGNOSIS — I152 Hypertension secondary to endocrine disorders: Secondary | ICD-10-CM

## 2021-08-31 DIAGNOSIS — Z23 Encounter for immunization: Secondary | ICD-10-CM

## 2021-08-31 DIAGNOSIS — Z6835 Body mass index (BMI) 35.0-35.9, adult: Secondary | ICD-10-CM

## 2021-08-31 MED ORDER — BUPROPION HCL ER (XL) 450 MG PO TB24: 450.0000 mg | ORAL_TABLET | Freq: Every day | ORAL | 1 refills | Status: DC

## 2021-08-31 MED ORDER — FLUCONAZOLE 150 MG PO TABS: 150.0000 mg | ORAL_TABLET | Freq: Once | ORAL | 1 refills | Status: DC

## 2021-08-31 MED ORDER — AMITRIPTYLINE HCL 25 MG PO TABS: 25.0000 mg | ORAL_TABLET | Freq: Every day | ORAL | 1 refills | Status: DC

## 2021-08-31 NOTE — Progress Notes (Deleted)
  HPI with pertinent ROS:   CC: Medication Refills  HPI:   I reviewed the past medical history, family history, social history, surgical history, and allergies today and no changes were needed.  Please see the problem list section below in epic for further details.   Physical exam:   General: Well Developed, well nourished, and in no acute distress.  Neuro: Alert and oriented x3, extra-ocular muscles intact, sensation grossly intact.  HEENT: Normocephalic, atraumatic, pupils equal round reactive to light, neck supple, no masses, no lymphadenopathy, thyroid nonpalpable.  Skin: Warm and dry, no rashes. Cardiac: Regular rate and rhythm, no murmurs rubs or gallops, no lower extremity edema.  Respiratory: Clear to auscultation bilaterally. Not using accessory muscles, speaking in full sentences.  Impression and Recommendations:    1. Class 2 severe obesity due to excess calories with serious comorbidity and body mass index (BMI) of 35.0 to 35.9 in adult (HCC) ***   No follow-ups on file. ___________________________________________ Clearnce Sorrel, DNP, APRN, FNP-BC Primary Care and [place]

## 2021-08-31 NOTE — Progress Notes (Signed)
  HPI with pertinent ROS:   CC: Chronic condition follow up  HPI: Pleasant 55 year old female presenting for a general follow up. She has been doing well overall since our last visit. Recently underwent left knee arthroscopy and is healing well from that. Plans to discuss surgical repair to her left foot with her podiatrist at her next appointment with him. Recently saw endocrinology and reports her A1c is down to 8.1%. She has also lost about 10lbs and is happy to see that her clothes fit better. Has been taking her medications as prescribed and is aware that she needs refills but is not sure which ones she needs. Has had some significant vaginal irritation and itching since her knee surgery and feels that it is a return of her chronic yeast infection symptoms. Has tried using OTC yeast creams but hasn't noted a big difference in her symptoms. No other concerning symptoms or issues to report.   I reviewed the past medical history, family history, social history, surgical history, and allergies today and no changes were needed.  Please see the problem list section below in epic for further details.   Physical exam:   General: Well Developed, well nourished, and in no acute distress.  Neuro: Alert and oriented x3.  HEENT: Normocephalic, atraumatic.  Skin: Warm and dry. Cardiac: Regular rate and rhythm, no murmurs rubs or gallops, no lower extremity edema.  Respiratory: Clear to auscultation bilaterally. Not using accessory muscles, speaking in full sentences.  Impression and Recommendations:    1. Encounter for medication refill Reviewed medications and refills sent as appropriate.    2. Class 2 severe obesity due to excess calories with serious comorbidity and body mass index (BMI) of 35.0 to 35.9 in adult [facility]) Continue Wellbutrin 450 mg daily. - buPROPion 450 MG TB24; Take 450 mg by mouth daily.  Dispense: 90 tablet; Refill: 1  3. Vaginal irritation Wet prep stat.  Result positive for  bacterial vaginosis.  Treating with oral metronidazole twice daily x7 days. - WET PREP FOR [place], YEAST, CLUE  4. Insomnia, unspecified type Continue amitriptyline as prescribed.  5. Hypertension associated with diabetes ([place]) At goal today.  Continue lisinopril 5 mg daily.  6. Flu vaccine need Flu vaccine given in office today. - Flu Vaccine QUAD 20mo+IM (Fluarix, Fluzone & Alfiuria Quad PF)  Return in about 6 months (around 02/28/2022) for chronic disease follow up. ___________________________________________ Clearnce Sorrel, DNP, APRN, FNP-BC Primary Care and [place]

## 2021-08-31 NOTE — Telephone Encounter (Signed)
Pt last OV 05/26/21

## 2021-09-01 LAB — WET PREP FOR TRICH, YEAST, CLUE
MICRO NUMBER:: 12633011
Specimen Quality: ADEQUATE

## 2021-09-01 MED ORDER — METRONIDAZOLE 500 MG PO TABS
500.0000 mg | ORAL_TABLET | Freq: Two times a day (BID) | ORAL | 0 refills | Status: AC
Start: 2021-09-01 — End: 2021-09-08

## 2021-09-15 ENCOUNTER — Ambulatory Visit (INDEPENDENT_AMBULATORY_CARE_PROVIDER_SITE_OTHER): Payer: Managed Care, Other (non HMO)

## 2021-09-15 ENCOUNTER — Ambulatory Visit (INDEPENDENT_AMBULATORY_CARE_PROVIDER_SITE_OTHER): Payer: Managed Care, Other (non HMO) | Admitting: Podiatry

## 2021-09-15 ENCOUNTER — Other Ambulatory Visit: Payer: Self-pay

## 2021-09-15 ENCOUNTER — Encounter: Payer: Self-pay | Admitting: Podiatry

## 2021-09-15 DIAGNOSIS — M14672 Charcot's joint, left ankle and foot: Secondary | ICD-10-CM | POA: Diagnosis not present

## 2021-09-15 DIAGNOSIS — M2042 Other hammer toe(s) (acquired), left foot: Secondary | ICD-10-CM | POA: Diagnosis not present

## 2021-09-15 DIAGNOSIS — S92512D Displaced fracture of proximal phalanx of left lesser toe(s), subsequent encounter for fracture with routine healing: Secondary | ICD-10-CM

## 2021-09-15 NOTE — Patient Instructions (Signed)
I ordered the CT of the left foot for the [facility] in [place]. Their number is [phone]

## 2021-09-16 ENCOUNTER — Telehealth: Payer: Self-pay | Admitting: *Deleted

## 2021-09-16 NOTE — Telephone Encounter (Addendum)
Called and spoke with Amy C from [place] and the authorization was approved for the CT of the left foot and the case # is [id number] and the insurance company is going to call the patient and verify that she is having it done at the [facility] and we are awaiting the fax from [place] and it will have the authorization number it. Lattie Haw

## 2021-09-17 NOTE — Progress Notes (Signed)
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  She said that she was can proceed with surgery as the toes are hurting as well as the top of her foot still.  Her A1c is improving and she is down from 8.1.  She is currently out of work given her left knee that she recently had surgery on.  Objective: AAO x3, NAD DP/PT pulses palpable bilaterally, CRT less than 3 seconds Sensation decreased with Semmes Weinstein monofilament Left foot is present.  Mild swelling dorsal aspect the foot.  I am concerned about the proximal midfoot today there appears to be some collapse present clinically.  There is hammertoe contractures present which are rigid.  No specific area of pinpoint tenderness.  Charcot, kneeling toe fractures, hammertoes No pain with calf compression, swelling, warmth, erythema  Assessment: Displaced toe fractures left third and fourth digits; hammertoes; neuropathy  Plan: -All treatment options discussed with the patient including all alternatives, risks, complications. -Repeat x-rays obtained and reviewed.  Significant arthritic changes present Lisfranc joint.  I am concerned about the midtarsal joint as well.   -We discussed with conservative as well as surgical options.  A1c is improving.  She will consider surgery once again proceed with this.  Prior to surgery order CT scan for further evaluation and for surgical planning purposes.  Once I get the CT scan back then we will further discuss the surgery.  Trula Slade DPM

## 2021-09-22 ENCOUNTER — Other Ambulatory Visit: Payer: Self-pay

## 2021-09-22 ENCOUNTER — Ambulatory Visit (INDEPENDENT_AMBULATORY_CARE_PROVIDER_SITE_OTHER): Payer: Self-pay

## 2021-09-22 DIAGNOSIS — M14672 Charcot's joint, left ankle and foot: Secondary | ICD-10-CM

## 2021-09-28 ENCOUNTER — Other Ambulatory Visit: Payer: Self-pay | Admitting: Medical-Surgical

## 2021-10-02 ENCOUNTER — Ambulatory Visit (INDEPENDENT_AMBULATORY_CARE_PROVIDER_SITE_OTHER): Payer: Self-pay | Admitting: Podiatry

## 2021-10-02 ENCOUNTER — Other Ambulatory Visit: Payer: Self-pay

## 2021-10-02 ENCOUNTER — Other Ambulatory Visit: Payer: Self-pay | Admitting: Internal Medicine

## 2021-10-02 DIAGNOSIS — M2042 Other hammer toe(s) (acquired), left foot: Secondary | ICD-10-CM

## 2021-10-02 DIAGNOSIS — Z01818 Encounter for other preprocedural examination: Secondary | ICD-10-CM

## 2021-10-02 DIAGNOSIS — M14672 Charcot's joint, left ankle and foot: Secondary | ICD-10-CM

## 2021-10-02 DIAGNOSIS — M2041 Other hammer toe(s) (acquired), right foot: Secondary | ICD-10-CM | POA: Diagnosis not present

## 2021-10-02 DIAGNOSIS — E1149 Type 2 diabetes mellitus with other diabetic neurological complication: Secondary | ICD-10-CM | POA: Diagnosis not present

## 2021-10-02 NOTE — Patient Instructions (Signed)

## 2021-10-03 LAB — CBC WITH DIFFERENTIAL/PLATELET
Basophils Absolute: 0.1 10*3/uL (ref 0.0–0.2)
Basos: 1 %
EOS (ABSOLUTE): 0.1 10*3/uL (ref 0.0–0.4)
Eos: 1 %
Hematocrit: 39 % (ref 34.0–46.6)
Hemoglobin: 12.2 g/dL (ref 11.1–15.9)
Immature Grans (Abs): 0 10*3/uL (ref 0.0–0.1)
Immature Granulocytes: 0 %
Lymphocytes Absolute: 5.2 10*3/uL — ABNORMAL HIGH (ref 0.7–3.1)
Lymphs: 42 %
MCH: 26 pg — ABNORMAL LOW (ref 26.6–33.0)
MCHC: 31.3 g/dL — ABNORMAL LOW (ref 31.5–35.7)
MCV: 83 fL (ref 79–97)
Monocytes Absolute: 0.6 10*3/uL (ref 0.1–0.9)
Monocytes: 5 %
Neutrophils Absolute: 6.3 10*3/uL (ref 1.4–7.0)
Neutrophils: 51 %
Platelets: 382 10*3/uL (ref 150–450)
RBC: 4.7 x10E6/uL (ref 3.77–5.28)
RDW: 13.9 % (ref 11.7–15.4)
WBC: 12.2 10*3/uL — ABNORMAL HIGH (ref 3.4–10.8)

## 2021-10-03 LAB — BASIC METABOLIC PANEL
BUN/Creatinine Ratio: 13 (ref 9–23)
BUN: 16 mg/dL (ref 6–24)
CO2: 23 mmol/L (ref 20–29)
Calcium: 9.6 mg/dL (ref 8.7–10.2)
Chloride: 102 mmol/L (ref 96–106)
Creatinine, Ser: 1.25 mg/dL — ABNORMAL HIGH (ref 0.57–1.00)
Glucose: 45 mg/dL — ABNORMAL LOW (ref 70–99)
Potassium: 4.3 mmol/L (ref 3.5–5.2)
Sodium: 140 mmol/L (ref 134–144)
eGFR: 51 mL/min/{1.73_m2} — ABNORMAL LOW (ref >59)

## 2021-10-03 LAB — HEMOGLOBIN A1C
Est. average glucose Bld gHb Est-mCnc: 177 mg/dL
Hgb A1c MFr Bld: 7.8 % — ABNORMAL HIGH (ref 4.8–5.6)

## 2021-10-05 NOTE — Progress Notes (Signed)
Subjective: 55 year old female presents the office today for follow-up evaluation of Charcot, history of Lisfranc injury to her left foot as well as hammertoes, toe fractures.  She states that she is continued discomfort and pain is up to her foot which is limiting her activity.  She wants to proceed with surgery at this point.  She is not sure how her sugar has been running and she states it has been running somewhat high after she had a steroid injection.  Denies any open sores.  Objective: AAO x3, NAD DP/PT pulses palpable bilaterally, CRT less than 3 seconds Decreased medial arch upon weightbearing.  Tenderness palpation on dorsal aspect midfoot and Lisfranc joint.  Hammertoes are also present on discomfort side shoes.  She is also recently been started to get some more discomfort along the rear foot as well.  No pain with calf compression, swelling, warmth, erythema  Assessment: Charcot foot, Lisfranc arthritis, hammertoes  Plan: -All treatment options discussed with the patient including all alternatives, risks, complications.  -Reviewed the CT scan with her.  We had a long discussion regards to options.  I think at this point would recommend discomfort on the Lisfranc joint notes where the patient indicates that there is severe arthritis noted.  Discussed with her going ahead and proceed with surgery for arthrodesis of the Lisfranc joint with arthroplasty of the fourth and fifth metatarsal cuboid joint.  If time allows during surgery discussed hammertoe repair of digits 1 through 5 but I discussed with her that if surgery is running long as need to be a staged procedure not do it all at 1 time.  Also discussed with her the Barefoot has some arthritic changes present she may need to have further surgery to address those issues as well.  Long-term she is likely to need bracing.   -We will check blood work prior to surgery including CBC, BMP, A1c -The incision placement as well as the  postoperative course was discussed with the patient. I discussed risks of the surgery which include, but not limited to, infection, bleeding, pain, swelling, need for further surgery, delayed or nonhealing, painful or ugly scar, numbness or sensation changes, over/under correction, recurrence, transfer lesions, further deformity, hardware failure, DVT/PE, loss of toe/foot. Patient understands these risks and wishes to proceed with surgery. The surgical consent was reviewed with the patient all 3 pages were signed. No promises or guarantees were given to the outcome of the procedure. All questions were answered to the best of my ability. Before the surgery the patient was encouraged to call the office if there is any further questions. The surgery will be performed at the [facility] on an outpatient basis. -Patient encouraged to call the office with any questions, concerns, change in symptoms.   Trula Slade DPM

## 2021-10-06 ENCOUNTER — Telehealth: Payer: Self-pay | Admitting: Urology

## 2021-10-06 NOTE — Telephone Encounter (Signed)
DOS - 10/16/21  ARTHRODESIS LIS FRAC 1-5 LEFT  --- [id number] HAMMERTOE REPAIR 1-5 LEFT --- [id number]   [employer]IGNA EFFECTIVE DATE - 07/28/15  PER [employer]IGNA'S AUTOMATIVE SYSTEM FOR CPT CODES 57972 AND 82060 NO PRIOR AUTH IS REQUIRED.  REF # - [id number] REF # - [id number] REF # - [id number] REF # - [id number] REF # - [id number] REF # - [id number] REF # - [id number] REF # - [id number] REF # - [id number] REF # - [id number]

## 2021-10-07 ENCOUNTER — Telehealth: Payer: Self-pay | Admitting: Podiatry

## 2021-10-07 ENCOUNTER — Other Ambulatory Visit: Payer: Self-pay | Admitting: Podiatry

## 2021-10-07 ENCOUNTER — Other Ambulatory Visit: Payer: Self-pay | Admitting: Medical-Surgical

## 2021-10-07 DIAGNOSIS — D72829 Elevated white blood cell count, unspecified: Secondary | ICD-10-CM

## 2021-10-07 DIAGNOSIS — Z01818 Encounter for other preprocedural examination: Secondary | ICD-10-CM

## 2021-10-07 DIAGNOSIS — E559 Vitamin D deficiency, unspecified: Secondary | ICD-10-CM

## 2021-10-07 NOTE — Telephone Encounter (Signed)
Patient called and stated that Dr. Jacqualyn Posey called her and she would like a return call back

## 2021-10-14 ENCOUNTER — Other Ambulatory Visit: Payer: Self-pay | Admitting: Podiatry

## 2021-10-14 LAB — BASIC METABOLIC PANEL
BUN/Creatinine Ratio: 20 (ref 9–23)
BUN: 20 mg/dL (ref 6–24)
CO2: 21 mmol/L (ref 20–29)
Calcium: 9.3 mg/dL (ref 8.7–10.2)
Chloride: 101 mmol/L (ref 96–106)
Creatinine, Ser: 0.99 mg/dL (ref 0.57–1.00)
Glucose: 335 mg/dL — ABNORMAL HIGH (ref 70–99)
Potassium: 4.3 mmol/L (ref 3.5–5.2)
Sodium: 137 mmol/L (ref 134–144)
eGFR: 67 mL/min/{1.73_m2} (ref >59)

## 2021-10-14 LAB — CBC WITH DIFFERENTIAL/PLATELET
Basophils Absolute: 0 10*3/uL (ref 0.0–0.2)
Basos: 0 %
EOS (ABSOLUTE): 0.1 10*3/uL (ref 0.0–0.4)
Eos: 1 %
Hematocrit: 40.9 % (ref 34.0–46.6)
Hemoglobin: 12.4 g/dL (ref 11.1–15.9)
Immature Grans (Abs): 0 10*3/uL (ref 0.0–0.1)
Immature Granulocytes: 0 %
Lymphocytes Absolute: 2.6 10*3/uL (ref 0.7–3.1)
Lymphs: 37 %
MCH: 25.8 pg — ABNORMAL LOW (ref 26.6–33.0)
MCHC: 30.3 g/dL — ABNORMAL LOW (ref 31.5–35.7)
MCV: 85 fL (ref 79–97)
Monocytes Absolute: 0.3 10*3/uL (ref 0.1–0.9)
Monocytes: 5 %
Neutrophils Absolute: 4 10*3/uL (ref 1.4–7.0)
Neutrophils: 57 %
Platelets: 361 10*3/uL (ref 150–450)
RBC: 4.81 x10E6/uL (ref 3.77–5.28)
RDW: 13.4 % (ref 11.7–15.4)
WBC: 7.1 10*3/uL (ref 3.4–10.8)

## 2021-10-14 LAB — VITAMIN D 25 HYDROXY (VIT D DEFICIENCY, FRACTURES): Vit D, 25-Hydroxy: 14.9 ng/mL — ABNORMAL LOW (ref 30.0–100.0)

## 2021-10-14 MED ORDER — VITAMIN D (ERGOCALCIFEROL) 1.25 MG (50000 UNIT) PO CAPS: 50000.0000 [IU] | ORAL_CAPSULE | ORAL | 0 refills | Status: DC

## 2021-10-16 ENCOUNTER — Encounter: Payer: Self-pay | Admitting: Podiatry

## 2021-10-16 ENCOUNTER — Other Ambulatory Visit: Payer: Self-pay | Admitting: Podiatry

## 2021-10-16 DIAGNOSIS — M19072 Primary osteoarthritis, left ankle and foot: Secondary | ICD-10-CM

## 2021-10-16 MED ORDER — PROMETHAZINE HCL 25 MG PO TABS: 25.0000 mg | ORAL_TABLET | Freq: Three times a day (TID) | ORAL | 0 refills | Status: DC | PRN

## 2021-10-16 MED ORDER — OXYCODONE-ACETAMINOPHEN 5-325 MG PO TABS
1.0000 | ORAL_TABLET | Freq: Four times a day (QID) | ORAL | 0 refills | Status: DC | PRN
Start: 2021-10-16 — End: 2021-11-18

## 2021-10-16 MED ORDER — CEPHALEXIN 500 MG PO CAPS: 500.0000 mg | ORAL_CAPSULE | Freq: Three times a day (TID) | ORAL | 0 refills | Status: DC

## 2021-10-16 NOTE — Progress Notes (Signed)
Post-op medications sent   Per the surgery center chart it has oxycodone as an allergy (rash) but she states it was from a long time ago and can likely do percocet. Per her chart she has been on Vicodin and percocet in the last few years.

## 2021-10-20 ENCOUNTER — Ambulatory Visit (INDEPENDENT_AMBULATORY_CARE_PROVIDER_SITE_OTHER): Payer: Managed Care, Other (non HMO) | Admitting: Podiatry

## 2021-10-20 ENCOUNTER — Other Ambulatory Visit: Payer: Self-pay

## 2021-10-20 ENCOUNTER — Telehealth: Payer: Self-pay

## 2021-10-20 ENCOUNTER — Ambulatory Visit (INDEPENDENT_AMBULATORY_CARE_PROVIDER_SITE_OTHER): Payer: Managed Care, Other (non HMO)

## 2021-10-20 DIAGNOSIS — M19072 Primary osteoarthritis, left ankle and foot: Secondary | ICD-10-CM | POA: Diagnosis not present

## 2021-10-20 DIAGNOSIS — Z9889 Other specified postprocedural states: Secondary | ICD-10-CM

## 2021-10-20 DIAGNOSIS — S93622S Sprain of tarsometatarsal ligament of left foot, sequela: Secondary | ICD-10-CM

## 2021-10-20 MED ORDER — FLUCONAZOLE 150 MG PO TABS: 150.0000 mg | ORAL_TABLET | Freq: Once | ORAL | 0 refills | Status: AC

## 2021-10-20 MED ORDER — HYDROCODONE-ACETAMINOPHEN 5-325 MG PO TABS: 1.0000 | ORAL_TABLET | Freq: Four times a day (QID) | ORAL | 0 refills | Status: DC | PRN

## 2021-10-20 NOTE — Progress Notes (Signed)
Subjective: Helen Perez is a 56 y.o. is seen today in office s/p left foot Lisfranc fusion preformed on October 16, 2021.  She states the pain is controlled is actually doing well.  The pain medicine does cause some nausea.  She denies any fevers or chills.  No chest pain or shortness of breath.  No calf pain.  Objective: General: No acute distress, AAOx3  DP/PT pulses palpable 2/4, CRT < 3 sec to all digits.  Protective sensation intact. Motor function intact.  Left foot: Incision is well coapted without any evidence of dehiscence with sutures intact. There is no surrounding erythema, ascending cellulitis, fluctuance, crepitus, malodor, drainage/purulence. There is mild to moderate edema around the surgical site. There is mild pain along the surgical site on exam today.  No other areas of tenderness to bilateral lower extremities.  No other open lesions or pre-ulcerative lesions.  No pain with calf compression, swelling, warmth, erythema.   Assessment and Plan:  Status post Lisfranc fusion left, doing well with no complications   -Treatment options discussed including all alternatives, risks, and complications -X-rays obtained reviewed.  Status post arthrodesis of the first, second, third metatarsal cuneiform joints.  Arthritic changes present of the foot.  No evidence of acute fracture. -I cleaned the incisions.  A small amount of antibiotic ointment was applied followed by dressing.  Keep dressing clean, dry, intact -Continue nonweightbearing -Ice/elevation -Pain medication as needed-prescribed Vicodin as she has done well with this previously -Continue vitamin D supplementation -Continue to monitor her glucose on a regular basis. -Monitor for any clinical signs or symptoms of infection and DVT/PE and directed to call the office immediately should any occur or go to the ER. -Follow-up as scheduled for possible suture removal or sooner if any problems arise. In the meantime, encouraged  to call the office with any questions, concerns, change in symptoms.   *I discussed with the patient today that I did not do the arthroplasty of the fourth and fifth metatarsal cuboid joints.  Prior to surgery I discussed with her not doing the arthrodesis.  I discussed the reasons for why do not proceed with the arthroplasty today.  I will discuss with the group at her next physician meeting later this week.  We also discussed prior to surgery as well as again today going back to the operating room in the next several weeks to fix the hammertoes.  She is having second thoughts about that today.  She will go back and do significant hammertoes should she have issues with the fourth and fifth metatarsal cuboid joint we can address that at that time.  Celesta Gentile, DPM

## 2021-10-20 NOTE — Telephone Encounter (Signed)
Knee scooter order placed with [facility]. Helen Perez notified that they will be in contact with her.

## 2021-10-26 ENCOUNTER — Ambulatory Visit (INDEPENDENT_AMBULATORY_CARE_PROVIDER_SITE_OTHER): Payer: Managed Care, Other (non HMO) | Admitting: Internal Medicine

## 2021-10-26 ENCOUNTER — Other Ambulatory Visit: Payer: Self-pay

## 2021-10-26 ENCOUNTER — Encounter: Payer: Self-pay | Admitting: Internal Medicine

## 2021-10-26 VITALS — BP 128/88 | HR 108 | Ht 67.0 in | Wt 229.0 lb

## 2021-10-26 DIAGNOSIS — Z794 Long term (current) use of insulin: Secondary | ICD-10-CM

## 2021-10-26 DIAGNOSIS — E1142 Type 2 diabetes mellitus with diabetic polyneuropathy: Secondary | ICD-10-CM

## 2021-10-26 DIAGNOSIS — E1165 Type 2 diabetes mellitus with hyperglycemia: Secondary | ICD-10-CM

## 2021-10-26 DIAGNOSIS — E1159 Type 2 diabetes mellitus with other circulatory complications: Secondary | ICD-10-CM | POA: Diagnosis not present

## 2021-10-26 NOTE — Patient Instructions (Signed)
°-   Continue  Toujeo 34 units daily  - Novolog 20 units with Breakfast and 20 units Lunch and 24 units with Supper - Novolog 4 units with a snack  - Continue Ozempic 2 mg weekly         HOW TO TREAT LOW BLOOD SUGARS (Blood sugar LESS THAN 70 MG/DL) Please follow the RULE OF 15 for the treatment of hypoglycemia treatment (when your (blood sugars are less than 70 mg/dL)   STEP 1: Take 15 grams of carbohydrates when your blood sugar is low, which includes:  3-4 GLUCOSE TABS  OR 3-4 OZ OF JUICE OR REGULAR SODA OR ONE TUBE OF GLUCOSE GEL    STEP 2: RECHECK blood sugar in 15 MINUTES STEP 3: If your blood sugar is still low at the 15 minute recheck --> then, go back to STEP 1 and treat AGAIN with another 15 grams of carbohydrates.

## 2021-10-26 NOTE — Progress Notes (Signed)
Name: Helen Perez  Age/ Sex: 56 y.o., female   MRN/ DOB: [id number], 09-Apr-1966     PCP: Samuel Bouche, NP   Reason for Endocrinology Evaluation: Type 2 Diabetes Mellitus  Initial Endocrine Consultative Visit: 01/30/2020    PATIENT IDENTIFIER: Helen Perez is a 56 y.o. female with a past medical history of HTN, TIA, OSA and T2DM. The patient has followed with Endocrinology clinic since 01/30/2020 for consultative assistance with management of her diabetes.  DIABETIC HISTORY:  Helen Perez was diagnosed with T2DM in 2000. She reports intolerance to Metformin.  She has been on an insulin pump for years.  Her hemoglobin A1c has ranged from 10.1% in 12/10/2019, peaking at 15.7% in 2016.  On her initial visit to our clinic her A1c was 12.5% , she was on Ozempic and novolog through insulin pump. We did not make any changes as she  Uses the pump only periodically as well as the Decox. Chronic hx of non-compliance, and was given the option to switch to MDI vs using the pump properly , she opted to continue with the pump at the time.     By 04/2020 we stopped insulin pump and started MDI regimen   Farxiga started 10/2020 and stopped by 02/2021 due to worsening genital infections   SUBJECTIVE:   During the last visit (07/10/2021): A1c 8.6% . Adjusted MDI regimen and continued  Ozempic     Today (10/26/2021): Helen Perez is here for a follow up on diabetes  She checks her blood sugars multiple  times daily, through the dexcom. The patient has not had hypoglycemic episodes since the last clinic visit.   She is Left knee sx - Dr. Berenice Primas' 11/7th  Saw Dr. Jacqualyn Posey 10/20/2020 for charcot foot 10/16/2021  Denies nausea, vomiting or diarrhea     HOME DIABETES REGIMEN:  - Toujeo  34 units once daily  - Novolog 20 units with Breakfast and 20 units with  Lunch and 22 units with Supper  - Ozempic 2 mg weekly  - Novolog (BG-130/20)     Statin: Yes ACE-I/ARB: yes     CONTINUOUS GLUCOSE  MONITORING RECORD INTERPRETATION    Dates of Recording:12/27-10/26/2020   Sensor description:Dexcom  Results statistics:   CGM use % of time  79%  Average and SD 214/76  Time in range  35  % Time Above 180 36  % Time above 250 28  % Time Below target <1       Glycemic patterns summary: Bg's optimal during the night, hyperglycemia noted during the day  Hyperglycemic episodes  postprandial  Hypoglycemic episodes occurred followung a bolus   Overnight periods: trends down     INpen Report :12/26-10/25/2021 Not available       DIABETIC COMPLICATIONS: Microvascular complications:  Neuropathy Denies: retinopathy,  CKD Last eye exam: Completed 2021   Macrovascular complications:  CVA Denies: CAD, PVD    HISTORY:  Past Medical History:  Past Medical History:  Diagnosis Date   Anxiety    Depression    Diabetes mellitus without complication ([place])    H/O syphilis    HSV-2 infection    Hypertension associated with diabetes ([place]) 03/30/2018   Renal insufficiency 10/13/2018   Sleep apnea    CPAP   Stroke [facility])    Past Surgical History:  Past Surgical History:  Procedure Laterality Date   ABDOMINAL HYSTERECTOMY     FOOT SURGERY Right    Social History:  reports that she quit smoking about 9 years  ago. Her smoking use included cigarettes. She has never used smokeless tobacco. She reports current alcohol use. She reports that she does not use drugs. Family History:  Family History  Problem Relation Age of Onset   Diabetes Mother    Heart disease Mother    Heart failure Mother    Diabetes Sister    Kidney failure Sister    Other Brother        quadraplegia   Diabetes Maternal Grandmother    Diabetes Brother    Kidney failure Brother    Diabetes Brother    Kidney failure Father    Colon cancer Neg Hx    Esophageal cancer Neg Hx    Pancreatic cancer Neg Hx    Stomach cancer Neg Hx    Liver disease Neg Hx      HOME MEDICATIONS: Allergies as of  10/26/2021       Reactions   Adhesive [tape]    From dexcom   Latex Itching, Swelling   Other Dermatitis   FREESTYLE LIBRE SENSOR- cellulitis, wound on skin         Medication List        Accurate as of October 26, 2021  2:50 PM. If you have any questions, ask your nurse or doctor.          acetaminophen 650 MG CR tablet Commonly known as: TYLENOL Take 1 tablet by mouth as needed.   acyclovir ointment 5 % Commonly known as: ZOVIRAX Apply topically.   amitriptyline 25 MG tablet Commonly known as: ELAVIL Take 1 tablet (25 mg total) by mouth at bedtime.   aspirin EC 81 MG tablet Take 1 tablet by mouth daily.   atorvastatin 40 MG tablet Commonly known as: LIPITOR TAKE ONE TABLET BY MOUTH DAILY   buPROPion HCl ER (XL) 450 MG Tb24 Take 450 mg by mouth daily.   cephALEXin 500 MG capsule Commonly known as: KEFLEX Take 1 capsule (500 mg total) by mouth 3 (three) times daily.   Dexcom G6 Receiver Devi USE AS INSTRUCTED TO CHECK BLOOD SUGAR DAILY   Dexcom G6 Sensor Misc INSERT 1 DEVICE INTO THE SKIN AS DIRECTED EVERY 10 DAYS   Dexcom G6 Transmitter Misc 1 EVERY 3 MONTHS   fexofenadine-pseudoephedrine 180-240 MG 24 hr tablet Commonly known as: ALLEGRA-D 24 Take 1 tablet by mouth daily as needed.   fluconazole 150 MG tablet Commonly known as: DIFLUCAN Take 150 mg by mouth once.   furosemide 20 MG tablet Commonly known as: LASIX TAKE 1 TABLET BY MOUTH DAILY. IF SIGNIFICANT SWELLING THAT IS NOT RESPONDING TO ELEVATION AND COMPRESSION, TAKE 1 EXTRA TABLET BY MOUTH NO MORE THAN 3 TIMES WEEKLY.   HYDROcodone-acetaminophen 5-325 MG tablet Commonly known as: NORCO/VICODIN Take 1-2 tablets by mouth every 6 (six) hours as needed.   InPen 100-Blue-Novolog-Fiasp Devi Generic drug: injection device for insulin Max 80 units daily   insulin aspart cartridge Commonly known as: NOVOLOG Max daily 80 units   Insulin Pen Needle 29G X 5MM Misc 1 Device by Does not apply  route as directed.   lisinopril 5 MG tablet Commonly known as: ZESTRIL Take 1 tablet by mouth daily.   meloxicam 15 MG tablet Commonly known as: MOBIC Take 15 mg by mouth daily.   mupirocin ointment 2 % Commonly known as: BACTROBAN Apply 1 application topically. 2-3 times daily   NONFORMULARY OR COMPOUNDED ITEM Antifungal solution: Terbinafine 3%, Fluconazole 2%, Tea Tree Oil 5%, Urea 10%, Ibuprofen 2% in DMSO suspension #  58mL   omeprazole 20 MG capsule Commonly known as: PRILOSEC Take 1 capsule by mouth daily.   oxyCODONE-acetaminophen 5-325 MG tablet Commonly known as: PERCOCET/ROXICET Take 1-2 tablets by mouth every 6 (six) hours as needed for severe pain.   Ozempic (2 MG/DOSE) 8 MG/3ML Sopn Generic drug: Semaglutide (2 MG/DOSE) Inject 2 mg into the skin once a week.   pregabalin 150 MG capsule Commonly known as: LYRICA TAKE ONE CAPSULE BY MOUTH TWICE A DAY   promethazine 25 MG tablet Commonly known as: PHENERGAN Take 1 tablet (25 mg total) by mouth every 8 (eight) hours as needed for nausea or vomiting.   senna-docusate 8.6-50 MG tablet Commonly known as: Senokot-S Take by mouth.   Skin Tac Adhesive Barrier Wipe Misc 1 applicator by Does not apply route as directed.   Toujeo Max SoloStar 300 UNIT/ML Solostar Pen Generic drug: insulin glargine (2 Unit Dial) Inject 34 Units into the skin daily.   triamcinolone 55 MCG/ACT Aero nasal inhaler Commonly known as: NASACORT Place 2 sprays into the nose daily.   triamcinolone cream 0.5 % Commonly known as: KENALOG Apply 1 application topically 2 (two) times daily. To affected areas.   Trulance 3 MG Tabs Generic drug: Plecanatide Take 1 tablet by mouth daily as needed.   valACYclovir 500 MG tablet Commonly known as: VALTREX TAKE ONE TABLET BY MOUTH DAILY   Vitamin D (Ergocalciferol) 1.25 MG (50000 UNIT) Caps capsule Commonly known as: DRISDOL Take 1 capsule (50,000 Units total) by mouth every 7 (seven)  days.   zolpidem 12.5 MG CR tablet Commonly known as: AMBIEN CR TAKE ONE TABLET BY MOUTH EVERY NIGHT AT BEDTIME AS NEEDED FOR SLEEP         OBJECTIVE:   Vital Signs: BP 128/88 (BP Location: Right Arm, Patient Position: Sitting, Cuff Size: Normal)    Pulse (!) 108    Ht 5\' 7"  (1.702 m)    Wt 229 lb (103.9 kg)    SpO2 99%    BMI 35.87 kg/m   Wt Readings from Last 3 Encounters:  08/31/21 220 lb (99.8 kg)  07/10/21 224 lb (101.6 kg)  05/26/21 226 lb 11.2 oz (102.8 kg)     Exam: General: Pt appears well and is in NAD  Lungs: Clear with good BS bilat with no rales, rhonchi, or wheezes  Heart: RRR   Extremities: No pretibial edema. Left foot boot in place   Neuro: MS is good with appropriate affect, pt is alert and Ox3    DM foot exam: 01/30/2020   The skin of the feet is intact without sores or ulcerations. The pedal pulses are 2+ on right and 2+ on left. The sensation is decreased  to a screening 5.07, 10 gram monofilament bilaterally    DATA REVIEWED:  Lab Results  Component Value Date   HGBA1C 7.8 (H) 10/02/2021   HGBA1C 8.1 (A) 08/14/2021   HGBA1C 8.6 (A) 07/10/2021   Lab Results  Component Value Date   MICROALBUR 150mg  05/26/2021   LDLCALC 58 02/04/2021   CREATININE 0.99 10/13/2021   Lab Results  Component Value Date   MICRALBCREAT 30-300mg /g 05/26/2021     Lab Results  Component Value Date   CHOL 129 02/04/2021   HDL 52 02/04/2021   LDLCALC 58 02/04/2021   TRIG 108 02/04/2021   CHOLHDL 2.5 02/04/2021         ASSESSMENT / PLAN / RECOMMENDATIONS:   1) Type 2 Diabetes Mellitus, with improving glycemic control, With neuropathic and  macrovascular  complications - Most recent A1c of 7.8  %. Goal A1c < 7.0 %.    - Praised the pt on improved glycemic control  - We discussed switching Ozempic to [facility] but she would like to wait until her knee and foot has healed from surgery  - Will increase prandial dose of Novolog with supper    MEDICATIONS: -Continue Toujeo 34 units once daily  -Change Novolog to 20 units with Breakfast, 20 units with lunch and 24 units with Supper  -Novolog  4 units with a snack  -Continue Ozempic 2 mg weekly  -Change correction factor : Novolog ( BG-135/15)    EDUCATION / INSTRUCTIONS: BG monitoring instructions: Patient is instructed to check her blood sugars 4 times a day, before meals and bedtime . Call [place] [facility] if: BG persistently < 70  I reviewed the Rule of 15 for the treatment of hypoglycemia in detail with the patient. Literature supplied.    2) Diabetic complications:  Eye: Does not have known diabetic retinopathy.  Neuro/ Feet: Does have known diabetic peripheral neuropathy .  Renal: Patient does not have known baseline CKD. She   is  on an ACEI/ARB at present.      F/U in 4 months    Signed electronically by: Mack Guise, MD  [facility] Endocrinology  [facility] [address] [place], [place] [address] Phone: [phone] FAX: [phone]   CC: Samuel Bouche, [place] [address] Phone: [phone]  Fax: [phone]  Return to Endocrinology clinic as below: Future Appointments  Date Time Provider [place]  10/29/2021  1:30 PM Trula Slade, DPM [facility]  11/12/2021 11:15 AM Trula Slade, DPM [facility]  03/01/2022  2:00 PM Samuel Bouche, NP [facility] None

## 2021-10-27 ENCOUNTER — Encounter: Payer: Self-pay | Admitting: Internal Medicine

## 2021-10-27 ENCOUNTER — Other Ambulatory Visit: Payer: Self-pay | Admitting: Medical-Surgical

## 2021-10-27 DIAGNOSIS — K5902 Outlet dysfunction constipation: Secondary | ICD-10-CM

## 2021-10-27 DIAGNOSIS — M79676 Pain in unspecified toe(s): Secondary | ICD-10-CM

## 2021-10-29 ENCOUNTER — Encounter: Payer: Managed Care, Other (non HMO) | Admitting: Podiatry

## 2021-10-29 ENCOUNTER — Other Ambulatory Visit: Payer: Self-pay

## 2021-10-29 ENCOUNTER — Ambulatory Visit (INDEPENDENT_AMBULATORY_CARE_PROVIDER_SITE_OTHER): Payer: Managed Care, Other (non HMO) | Admitting: Podiatry

## 2021-10-29 DIAGNOSIS — Z9889 Other specified postprocedural states: Secondary | ICD-10-CM

## 2021-10-29 DIAGNOSIS — M19072 Primary osteoarthritis, left ankle and foot: Secondary | ICD-10-CM

## 2021-11-03 NOTE — Progress Notes (Signed)
Subjective: Helen Perez is a 56 y.o. is seen today in office s/p left foot Lisfranc fusion preformed on October 16, 2021.  Helen Perez that she is doing well and her pain is controlled.  No recent injury or falls.  She has been staying nonweightbearing.  Denies any calf pain, chest pain or shortness of breath.  No fevers or chills that she reports.   Objective: General: No acute distress, AAOx3  DP/PT pulses palpable 2/4, CRT < 3 sec to all digits.  Protective sensation intact. Motor function intact.  Left foot: Incision is well coapted without any evidence of dehiscence with sutures intact.  There is decreased edema.  There is no surrounding erythema, drainage or pus.  No signs of infection.  Decreased tenderness palpation on surgical sites. No other areas of tenderness to bilateral lower extremities.  No other open lesions or pre-ulcerative lesions.  No pain with calf compression, swelling, warmth, erythema.   Assessment and Plan:  Status post Lisfranc fusion left, doing well with no complications   -Treatment options discussed including all alternatives, risks, and complications -I remove The sutures today without any complications.  All the other half intact until next week.  Small amount of antibiotic ointment and a bandage applied.  Keep the dressing clean, dry, intact discussed she can remove it later this week and start to wash with soap and water and dry thoroughly and apply a similar bandage. -Continue nonweightbearing -Ice/elevation -Pain medication as needed-prescribed Vicodin as she has done well with this previously -Continue vitamin D supplementation -Continue to monitor her glucose on a regular basis. -Monitor for any clinical signs or symptoms of infection and DVT/PE and directed to call the office immediately should any occur or go to the ER. -Follow-up as scheduled for suture removal or sooner if any problems arise. In the meantime, encouraged to call the office with any  questions, concerns, change in symptoms.   *I did discuss the case with the during her monthly physician meeting.  After discussion we believe that the best option for her would be to not return to surgery for now fixed hammertoe surgery just left fourth and fifth metatarsal cuboid joint.  She was having hammertoes done and likely later this year we can see how she does getting back to work with the pain.  If she has symptoms along the fourth, fifth metatarsal cuboid joint and then consider fusion at that point.  Celesta Gentile, DPM

## 2021-11-04 DIAGNOSIS — M79676 Pain in unspecified toe(s): Secondary | ICD-10-CM

## 2021-11-06 ENCOUNTER — Other Ambulatory Visit: Payer: Self-pay

## 2021-11-06 ENCOUNTER — Ambulatory Visit (INDEPENDENT_AMBULATORY_CARE_PROVIDER_SITE_OTHER): Payer: Managed Care, Other (non HMO)

## 2021-11-06 DIAGNOSIS — Z9889 Other specified postprocedural states: Secondary | ICD-10-CM

## 2021-11-06 DIAGNOSIS — S93622S Sprain of tarsometatarsal ligament of left foot, sequela: Secondary | ICD-10-CM

## 2021-11-06 NOTE — Progress Notes (Signed)
Patient presents in office to removal the remaining sutures, no complication during this process. Per Dr. Jacqualyn Posey he would like for patient to remain non weightbearing. Patient was informed she is able to wash her soak with warm water and soap. NO SOAKING.  Appointment is already made for a 1 week follow up with doctor Jacqualyn Posey

## 2021-11-12 ENCOUNTER — Ambulatory Visit (INDEPENDENT_AMBULATORY_CARE_PROVIDER_SITE_OTHER): Payer: Managed Care, Other (non HMO) | Admitting: Podiatry

## 2021-11-12 ENCOUNTER — Other Ambulatory Visit: Payer: Self-pay

## 2021-11-12 ENCOUNTER — Telehealth: Payer: Self-pay | Admitting: Urology

## 2021-11-12 ENCOUNTER — Ambulatory Visit (INDEPENDENT_AMBULATORY_CARE_PROVIDER_SITE_OTHER): Payer: Managed Care, Other (non HMO)

## 2021-11-12 DIAGNOSIS — M19072 Primary osteoarthritis, left ankle and foot: Secondary | ICD-10-CM | POA: Diagnosis not present

## 2021-11-12 DIAGNOSIS — E559 Vitamin D deficiency, unspecified: Secondary | ICD-10-CM

## 2021-11-12 DIAGNOSIS — Z9889 Other specified postprocedural states: Secondary | ICD-10-CM

## 2021-11-12 DIAGNOSIS — Z969 Presence of functional implant, unspecified: Secondary | ICD-10-CM

## 2021-11-12 NOTE — Telephone Encounter (Signed)
DOS - 11/18/21  REMOVAL FIXATION DEEP X'S 2 LEFT --- 20680  [employer] EFFECTIVE DATE - 07/28/15   PER [employer]'S AUTOMATIVE SYSTEM FOR CPT CODES 14643 NO PRIOR AUTH IS REQUIRED.   REF # - [id number] REF # - [id number]

## 2021-11-12 NOTE — Patient Instructions (Signed)

## 2021-11-15 ENCOUNTER — Other Ambulatory Visit: Payer: Self-pay | Admitting: Podiatry

## 2021-11-15 NOTE — Progress Notes (Signed)
Subjective: Helen Perez is a 56 y.o. is seen today in office s/p left foot Lisfranc fusion preformed on October 16, 2021.  She said that she has been feeling well and the swelling has improved and she has not had any pain.  She states has been walking in the cam boot without any issues (she was suppose to be NWB).  No injuries that she reports.  No fevers or chills.  No Pain, chest pain, shortness of breath.    Objective: General: No acute distress, AAOx3  DP/PT pulses palpable 2/4, CRT < 3 sec to all digits.  Sensation decreased with Semmes Weinstein monofilament which is not new Left foot: Incision is well coapted without any evidence of dehiscence and scars are formed.  There is mild edema present but appears to be improved.  There is no erythema or warmth.  No tenderness to palpation on the foot.  No pain on surgical sites or other areas today.  No other open lesions or pre-ulcerative lesions.  No pain with calf compression, swelling, warmth, erythema.   Assessment and Plan:  Status post Lisfranc fusion left, hardware failure  -Treatment options discussed including all alternatives, risks, and complications -X-rays obtained reviewed.  Staple broken on the second metatarsocuneiform joint as well as loosening of the screws on the first metatarsal cuneiform joint.  Otherwise appears to be increased consolidation noted across the metatarsal cuneiform joints.  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 -Discussed with patient she is to stay nonweightbearing.  I discussed with her given the hardware changes that she is to return to the operating room for exchange of hardware.  We will plan on doing this next week.  Discussed the surgery was postoperative course.  She understands this and she wants to proceed next week as recommended. -The incision placement as well as the postoperative course was discussed with the patient. I discussed risks of the surgery which include, but not limited to,  infection, bleeding, pain, swelling, need for further surgery, delayed or nonhealing, painful or ugly scar, numbness or sensation changes, over/under correction, recurrence, transfer lesions, further deformity, hardware failure, DVT/PE, loss of toe/foot. Patient understands these risks and wishes to proceed with surgery. The surgical consent was reviewed with the patient all 3 pages were signed. No promises or guarantees were given to the outcome of the procedure. All questions were answered to the best of my ability. Before the surgery the patient was encouraged to call the office if there is any further questions. The surgery will be performed at the [facility] on an outpatient basis.  *Recheck vitamin d level next appointment   Trula Slade DPM

## 2021-11-18 ENCOUNTER — Other Ambulatory Visit: Payer: Self-pay | Admitting: Podiatry

## 2021-11-18 DIAGNOSIS — Z4889 Encounter for other specified surgical aftercare: Secondary | ICD-10-CM | POA: Diagnosis not present

## 2021-11-18 MED ORDER — PROMETHAZINE HCL 25 MG PO TABS: 25.0000 mg | ORAL_TABLET | Freq: Three times a day (TID) | ORAL | 0 refills | Status: DC | PRN

## 2021-11-18 MED ORDER — OXYCODONE-ACETAMINOPHEN 5-325 MG PO TABS: 1.0000 | ORAL_TABLET | Freq: Four times a day (QID) | ORAL | 0 refills | Status: DC | PRN

## 2021-11-18 MED ORDER — CEPHALEXIN 500 MG PO CAPS: 500.0000 mg | ORAL_CAPSULE | Freq: Three times a day (TID) | ORAL | 0 refills | Status: DC

## 2021-11-18 NOTE — Progress Notes (Signed)
Postop medications sent 

## 2021-11-24 ENCOUNTER — Ambulatory Visit (INDEPENDENT_AMBULATORY_CARE_PROVIDER_SITE_OTHER): Payer: Managed Care, Other (non HMO) | Admitting: Podiatry

## 2021-11-24 ENCOUNTER — Ambulatory Visit (INDEPENDENT_AMBULATORY_CARE_PROVIDER_SITE_OTHER): Payer: Managed Care, Other (non HMO)

## 2021-11-24 ENCOUNTER — Other Ambulatory Visit: Payer: Self-pay

## 2021-11-24 DIAGNOSIS — Z9889 Other specified postprocedural states: Secondary | ICD-10-CM

## 2021-11-24 DIAGNOSIS — E559 Vitamin D deficiency, unspecified: Secondary | ICD-10-CM | POA: Diagnosis not present

## 2021-11-24 DIAGNOSIS — M19072 Primary osteoarthritis, left ankle and foot: Secondary | ICD-10-CM

## 2021-11-25 ENCOUNTER — Other Ambulatory Visit: Payer: Self-pay

## 2021-11-25 ENCOUNTER — Telehealth: Payer: Self-pay | Admitting: Medical-Surgical

## 2021-11-25 DIAGNOSIS — B379 Candidiasis, unspecified: Secondary | ICD-10-CM

## 2021-11-25 MED ORDER — FLUCONAZOLE 150 MG PO TABS: 150.0000 mg | ORAL_TABLET | Freq: Once | ORAL | 1 refills | Status: AC

## 2021-11-25 NOTE — Telephone Encounter (Signed)
Patient came by today and made an appt on Monday, 11/30/21, but she stated she just had surgery and she is currently on penicillin and stated she usually gets a yeast infection on this medication and was wondering if she could have something called in for this?

## 2021-11-25 NOTE — Telephone Encounter (Signed)
Patient has been notified of information below. AM

## 2021-11-26 ENCOUNTER — Other Ambulatory Visit: Payer: Self-pay | Admitting: Medical-Surgical

## 2021-11-26 LAB — VITAMIN D 25 HYDROXY (VIT D DEFICIENCY, FRACTURES): Vit D, 25-Hydroxy: 38 ng/mL (ref 30–100)

## 2021-11-28 NOTE — Progress Notes (Signed)
Subjective: Helen Perez is a 56 y.o. is seen today in office s/p return to the OR for hardware exchange left foot preformed on 11/18/2021.  She has been doing well and not in significant pain.  Denies any fevers or chills.  No calf pain, chest pain, shortness of breath.  She has been nonweightbearing but is difficult to get around.  Objective: General: No acute distress, AAOx3  DP/PT pulses palpable 2/4, CRT < 3 sec to all digits.  Protective sensation intact. Motor function intact.  Left foot: Incision is well coapted without any evidence of dehiscence with sutures intact. There is no surrounding erythema, ascending cellulitis, fluctuance, crepitus, malodor, drainage/purulence. There is mild but actually improved edema around the surgical site compared to prior to surgery. There is no significant pain along the surgical site.  No other areas of tenderness to bilateral lower extremities.  No other open lesions or pre-ulcerative lesions.  No pain with calf compression, swelling, warmth, erythema.   Assessment and Plan:  Status post left foot exchange of hardware, doing well with no complications   -Treatment options discussed including all alternatives, risks, and complications -X-rays obtained reviewed.  Hardware intact with any complicating factors.  No evidence of acute fracture. -Incisions were clean.  Small amount of antibiotic ointment and bandage applied.  Keep the dressings clean, dry, intact. -Recheck vitamin D level -Nonweightbearing-we will try to arrange home health for physical therapy -Ice/elevation -Pain medication as needed. -Monitor for any clinical signs or symptoms of infection and DVT/PE and directed to call the office immediately should any occur or go to the ER. -Follow-up as scheduled or sooner if any problems arise. In the meantime, encouraged to call the office with any questions, concerns, change in symptoms.   *Awaiting culture results from surgery  Celesta Gentile, DPM

## 2021-11-30 ENCOUNTER — Encounter: Payer: Self-pay | Admitting: Medical-Surgical

## 2021-11-30 ENCOUNTER — Ambulatory Visit (INDEPENDENT_AMBULATORY_CARE_PROVIDER_SITE_OTHER): Payer: Managed Care, Other (non HMO) | Admitting: Medical-Surgical

## 2021-11-30 ENCOUNTER — Other Ambulatory Visit: Payer: Self-pay

## 2021-11-30 VITALS — BP 131/85 | HR 114 | Resp 20 | Ht 67.0 in | Wt 219.4 lb

## 2021-11-30 DIAGNOSIS — I152 Hypertension secondary to endocrine disorders: Secondary | ICD-10-CM

## 2021-11-30 DIAGNOSIS — Z794 Long term (current) use of insulin: Secondary | ICD-10-CM

## 2021-11-30 DIAGNOSIS — E1159 Type 2 diabetes mellitus with other circulatory complications: Secondary | ICD-10-CM | POA: Diagnosis not present

## 2021-11-30 DIAGNOSIS — F322 Major depressive disorder, single episode, severe without psychotic features: Secondary | ICD-10-CM

## 2021-11-30 DIAGNOSIS — F419 Anxiety disorder, unspecified: Secondary | ICD-10-CM | POA: Diagnosis not present

## 2021-11-30 NOTE — Progress Notes (Signed)
°  HPI with pertinent ROS:   CC: Update on health  HPI: Pleasant 56 year old female presenting today to discuss her recent health concerns and surgeries. She has had surgery on her left knee as well as two surgeries on her left foot. Overall she has been healing well although she is quite frustrated at the restricted mobility that the NWB status for her left foot has imposed. Her second surgery on her foot was to replace a screw and a staple that were not healing well/staying in place. Reports that she thought that she should come in to keep me updated as her PCP about what she has been going through.  Her mood is suffering greatly due to the prolonged trouble with her left foot. Notes that being busy and able to do things helps her tremendously with anxiety and depression but she has been very limited in her ability to do normal things. Was previously taking Wellbutrin but stopped it due to cost after her dose was increased. Has been taking Amitriptyline 25mg  nightly which helps with sleep but not as much for mood. Not interested in medication adjustment or starting a new one since she knows that the fix for her mood issues is to just get back on her feet and return to her normal life.   Is followed by endocrinology for her DM management. Reports that her sugars have been good lately and that she no longer has to take the shots. Has been working to lose weight. Notes a decreased appetite with the increased dose of Ozempic.   I reviewed the past medical history, family history, social history, surgical history, and allergies today and no changes were needed.  Please see the problem list section below in epic for further details.   Physical exam:   General: Well Developed, well nourished, and in no acute distress.  Neuro: Alert and oriented x3.  HEENT: Normocephalic, atraumatic.  Skin: Warm and dry. Cardiac: Regular rate and rhythm, no murmurs rubs or gallops, no lower extremity edema.   Respiratory: Clear to auscultation bilaterally. Not using accessory muscles, speaking in full sentences.  Impression and Recommendations:    1. Hypertension associated with diabetes ([place]) BP at goal. Continue Lisinopril 5mg  daily.   2. Current severe episode of major depressive disorder without psychotic features without prior episode ([place]) 3. Anxiety Offered medication as well as counseling to help with symptoms. Declined referral and medication changes. Patient staunchly denies SI/HI or plans for self-harm. If she decides that she would like to try changing medications, plan to continue Amitriptyline 25mg  nightly but consider increasing this. Also consider adding back Wellbutrin.   4. Type 2 diabetes mellitus with other circulatory complication, with long-term current use of insulin ([place]) Followed by Endocrinology. I am quite proud of her progress with glucose control and the big improvement on her A1c. She has done a great job and I made sure to let her know!  Return in about 3 months (around 02/27/2022) for chronic disease follow up. ___________________________________________ Clearnce Sorrel, DNP, APRN, FNP-BC [facility] [place]

## 2021-12-02 ENCOUNTER — Telehealth: Payer: Self-pay | Admitting: *Deleted

## 2021-12-02 NOTE — Telephone Encounter (Signed)
Faxed referral to [facility],received confirmation 12/02/21, was originally sent to [facility], insurance out of network.

## 2021-12-02 NOTE — Telephone Encounter (Signed)
-----   Message from Jonnie Finner sent at 12/01/2021  1:18 PM EST ----- Marykay Lex, I reached out to [facility] and they are not in network with her insurance. Will you check with [facility] or [facility]? She had surgery on 11/18/2021. Thanks  ----- Message ----- From: Trula Slade, DPM Sent: 11/28/2021   9:12 AM EST To: Jonnie Finner  I put an order in for home health care/physical therapy.  Could you follow-up on this?  Thank you.

## 2021-12-03 ENCOUNTER — Telehealth: Payer: Self-pay | Admitting: *Deleted

## 2021-12-03 ENCOUNTER — Other Ambulatory Visit: Payer: Self-pay

## 2021-12-03 ENCOUNTER — Ambulatory Visit (INDEPENDENT_AMBULATORY_CARE_PROVIDER_SITE_OTHER): Payer: Managed Care, Other (non HMO)

## 2021-12-03 ENCOUNTER — Ambulatory Visit (INDEPENDENT_AMBULATORY_CARE_PROVIDER_SITE_OTHER): Payer: Managed Care, Other (non HMO) | Admitting: Podiatry

## 2021-12-03 DIAGNOSIS — M19072 Primary osteoarthritis, left ankle and foot: Secondary | ICD-10-CM

## 2021-12-03 DIAGNOSIS — Z9889 Other specified postprocedural states: Secondary | ICD-10-CM

## 2021-12-03 NOTE — Telephone Encounter (Signed)
[facility] denied referral , no staff coverage for the area. Refaxed referral to [facility], received confirmation 12/03/21.

## 2021-12-04 ENCOUNTER — Encounter: Payer: Self-pay | Admitting: Podiatry

## 2021-12-04 NOTE — Progress Notes (Signed)
Reviewed wound culture results from surgery, no growth left foot.

## 2021-12-06 NOTE — Progress Notes (Signed)
Subjective: Helen Perez is a 56 y.o. is seen today in office s/p return to the OR for hardware exchange left foot preformed on 11/18/2021.  She presents today for suture removal.  She states that overall she has been doing better but she states that her foot got caught in the sheets and she was nervous that something moved.  She had some increased discomfort after that for short amount time.  No other injuries.  She denies any fevers or chills.  No other concerns.    Objective: General: No acute distress, AAOx3  DP/PT pulses palpable 2/4, CRT < 3 sec to all digits.  Protective sensation intact. Motor function intact.  Left foot: Incision is well coapted without any evidence of dehiscence with sutures intact. There is no surrounding erythema, ascending cellulitis, fluctuance, crepitus, malodor, drainage/purulence.  Still some mild edema present but overall improving.  There is no significant pain along the surgical site.  No other areas of tenderness to bilateral lower extremities.  No other open lesions or pre-ulcerative lesions.  No pain with calf compression, swelling, warmth, erythema.   Assessment and Plan:  Status post left foot exchange of hardware, doing well with no complications   -Treatment options discussed including all alternatives, risks, and complications -X-rays obtained reviewed.  Hardware intact with any complicating factors.  No evidence of acute fracture. -I removed the sutures today without complications incisions are well coapted.  Steri-Strip applied for reinforcement.  Small amount of antibiotic ointment and a bandage applied.  Discussed that she can wash the foot with soap and water and dry thoroughly and apply a similar bandage. -Continue strict nonweightbearing, cam boot at all times. -I am to arrange home health, physical therapy -Ice/elevation -Pain medication as needed. -Monitor for any clinical signs or symptoms of infection and DVT/PE and directed to call the  office immediately should any occur or go to the ER. -Follow-up as scheduled or sooner if any problems arise. In the meantime, encouraged to call the office with any questions, concerns, change in symptoms.  Celesta Gentile, DPM

## 2021-12-07 ENCOUNTER — Encounter: Payer: Self-pay | Admitting: Podiatry

## 2021-12-09 ENCOUNTER — Encounter: Payer: Self-pay | Admitting: Podiatry

## 2021-12-17 ENCOUNTER — Ambulatory Visit (INDEPENDENT_AMBULATORY_CARE_PROVIDER_SITE_OTHER): Payer: Managed Care, Other (non HMO)

## 2021-12-17 ENCOUNTER — Ambulatory Visit (INDEPENDENT_AMBULATORY_CARE_PROVIDER_SITE_OTHER): Payer: Managed Care, Other (non HMO) | Admitting: Podiatry

## 2021-12-17 ENCOUNTER — Other Ambulatory Visit: Payer: Self-pay | Admitting: Podiatry

## 2021-12-17 ENCOUNTER — Other Ambulatory Visit: Payer: Self-pay

## 2021-12-17 DIAGNOSIS — M19072 Primary osteoarthritis, left ankle and foot: Secondary | ICD-10-CM

## 2021-12-17 DIAGNOSIS — Z9889 Other specified postprocedural states: Secondary | ICD-10-CM

## 2021-12-21 NOTE — Progress Notes (Signed)
Subjective: ?Eldean Wussow is a 56 y.o. is seen today in office s/p return to the OR for hardware exchange left foot preformed on 11/18/2021.  States that she is doing well.  She is eager to get rid of the boot.  No new issues or concerns.  No pain.  Denies any fevers or chills.   ? ?Objective: ?General: No acute distress, AAOx3  ?DP/PT pulses palpable 2/4, CRT < 3 sec to all digits.  ?Protective sensation intact. Motor function intact.  ?Left foot: Incision is well coapted without any evidence of dehiscence and scars are formed.  There is some mild edema present but overall appears to be improved.  No erythema or warmth.  No increase in temperature gradient.  No signs of infection.  There is no pain on exam today. ?No other open lesions or pre-ulcerative lesions.  ?No pain with calf compression, swelling, warmth, erythema.  ? ? ? ? ?Assessment and Plan:  ?Status post left foot exchange of hardware, doing well with no complications  ? ?-Treatment options discussed including all alternatives, risks, and complications ?-X-rays obtained reviewed.  Hardware intact with any complicating factors.  No evidence of acute fracture. ?-Incisions healing well.  However I do want to remain nonweightbearing right cam boot.  Discussed she can remove it at nighttime while sleeping but otherwise she is to wear at all times.  Continue to ice and elevate as well as compression of leg postoperative edema. ? ?Return in about 2 weeks (around 12/31/2021).  Repeat x-rays next appointment ? ?Trula Slade DPM\ ?

## 2021-12-29 ENCOUNTER — Ambulatory Visit (INDEPENDENT_AMBULATORY_CARE_PROVIDER_SITE_OTHER): Payer: Managed Care, Other (non HMO)

## 2021-12-29 ENCOUNTER — Other Ambulatory Visit: Payer: Self-pay

## 2021-12-29 ENCOUNTER — Ambulatory Visit (INDEPENDENT_AMBULATORY_CARE_PROVIDER_SITE_OTHER): Payer: Managed Care, Other (non HMO) | Admitting: Podiatry

## 2021-12-29 DIAGNOSIS — Z9889 Other specified postprocedural states: Secondary | ICD-10-CM

## 2021-12-29 DIAGNOSIS — M19072 Primary osteoarthritis, left ankle and foot: Secondary | ICD-10-CM

## 2022-01-01 NOTE — Progress Notes (Signed)
Subjective: ?Helen Perez is a 56 y.o. is seen today in office s/p return to the OR for hardware exchange left foot preformed on 11/18/2021.  States that she has been doing well.  She has not had any pain although she does have neuropathy but she states the swelling has much improved and she thinks the foot looks good.  Denies any fevers, chills, nausea, vomiting.  No chest pain, shortness of breath or calf pain.  No other concerns today.  ? ?Objective: ?General: No acute distress, AAOx3  ?DP/PT pulses palpable 2/4, CRT < 3 sec to all digits.  ?Protective sensation intact. Motor function intact.  ?Left foot: Incision is well coapted without any evidence of dehiscence and scars are formed.  There is no tenderness palpation is no palpable hardware.  There is decreased edema there is still some mild edema still present noted.  No erythema or warmth. ?No other open lesions or pre-ulcerative lesions.  ?No pain with calf compression, swelling, warmth, erythema.  ? ? ?Assessment and Plan:  ?Status post left foot exchange of hardware, doing well with no complications  ? ?-Treatment options discussed including all alternatives, risks, and complications ?-X-rays obtained reviewed.  Hardware intact with any complicating factors.  No evidence of acute fracture.  Radiolucencies on the left along the first metatarsal cuneiform joint fusion. ?-I sent I discussed with her she can do partial weightbearing but she must wear the cam boot at all times, ice and elevate.  If she is to be on her feet for long period of time that she is to use the knee scooter.  Given the neuropathy discussed that we cannot go based on pain.  Needs to continue with compression wrapping postoperative edema as well. ? ?Return in about 2 weeks (around 01/12/2022). ? ?Trula Slade DPM ? ?

## 2022-01-12 ENCOUNTER — Other Ambulatory Visit: Payer: Self-pay

## 2022-01-12 ENCOUNTER — Ambulatory Visit (INDEPENDENT_AMBULATORY_CARE_PROVIDER_SITE_OTHER): Payer: Managed Care, Other (non HMO)

## 2022-01-12 ENCOUNTER — Ambulatory Visit (INDEPENDENT_AMBULATORY_CARE_PROVIDER_SITE_OTHER): Payer: Managed Care, Other (non HMO) | Admitting: Podiatry

## 2022-01-12 DIAGNOSIS — Z9889 Other specified postprocedural states: Secondary | ICD-10-CM

## 2022-01-12 DIAGNOSIS — S92315K Nondisplaced fracture of first metatarsal bone, left foot, subsequent encounter for fracture with nonunion: Secondary | ICD-10-CM

## 2022-01-12 DIAGNOSIS — M960 Pseudarthrosis after fusion or arthrodesis: Secondary | ICD-10-CM

## 2022-01-13 NOTE — Progress Notes (Signed)
Subjective: ?Helen Perez is a 56 y.o. is seen today in office for follow evaluation after undergoing left Lisfranc arthrodesis performed on October 16, 2021.  She is likely return the operating room for a first 2023 for exchange of hardware.  She has been nonweightbearing in the cam boot using the knee scooter but she states that she has had but only slight pressure to the foot in the boot.  Otherwise she states that she been doing well and the pain is improved.  Swelling is also improved.  She has no new concerns ? ? ?Objective: ?General: No acute distress, AAOx3  ?DP/PT pulses palpable 2/4, CRT < 3 sec to all digits.  ?Protective sensation intact. Motor function intact.  ?Left foot: Incision is well coapted without any evidence of dehiscence and scars are formed.  There is still some mild edema present to the foot but there is no erythema or warmth.  There is no area pinpoint tenderness.  Hammertoes are present. ?No other open lesions or pre-ulcerative lesions.  ?No pain with calf compression, swelling, warmth, erythema.  ? ? 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 ?-Treatment options discussed including all alternatives, risks, and complications ?-Repeat x-rays obtained and reviewed.  3 views of the left foot were obtained.  Hardware intact status post first, second, third metatarsal cuneiform joint arthrodesis.  Evidence of radiolucency consistent with a nonunion this point with the fracture gap the left and less than 1 cm. ?-At this point continue cam boot, nonweightbearing.  She can do partial weightbearing only for very short distances.  Continue to ice, elevate as well as compression of the postoperative edema. ? ?Return in about 3 weeks (around 02/02/2022). Repeat x-ray ? ?Trula Slade DPM ?

## 2022-01-15 ENCOUNTER — Other Ambulatory Visit: Payer: Self-pay | Admitting: Podiatry

## 2022-01-15 DIAGNOSIS — Z9889 Other specified postprocedural states: Secondary | ICD-10-CM

## 2022-01-18 ENCOUNTER — Emergency Department (HOSPITAL_BASED_OUTPATIENT_CLINIC_OR_DEPARTMENT_OTHER): Payer: Managed Care, Other (non HMO)

## 2022-01-18 ENCOUNTER — Other Ambulatory Visit: Payer: Self-pay

## 2022-01-18 ENCOUNTER — Emergency Department (HOSPITAL_BASED_OUTPATIENT_CLINIC_OR_DEPARTMENT_OTHER)
Admission: EM | Admit: 2022-01-18 | Discharge: 2022-01-18 | Disposition: A | Discharge status: Home / Self Care | Payer: Managed Care, Other (non HMO) | Attending: Emergency Medicine | Admitting: Emergency Medicine

## 2022-01-18 ENCOUNTER — Emergency Department (HOSPITAL_COMMUNITY): Payer: Managed Care, Other (non HMO)

## 2022-01-18 ENCOUNTER — Encounter (HOSPITAL_BASED_OUTPATIENT_CLINIC_OR_DEPARTMENT_OTHER): Payer: Self-pay

## 2022-01-18 DIAGNOSIS — I6330 Cerebral infarction due to thrombosis of unspecified cerebral artery: Secondary | ICD-10-CM

## 2022-01-18 DIAGNOSIS — R42 Dizziness and giddiness: Secondary | ICD-10-CM | POA: Insufficient documentation

## 2022-01-18 LAB — CBC
HCT: 37 % (ref 36.0–46.0)
Hemoglobin: 11.5 g/dL — ABNORMAL LOW (ref 12.0–15.0)
MCH: 26.3 pg (ref 26.0–34.0)
MCHC: 31.1 g/dL (ref 30.0–36.0)
MCV: 84.7 fL (ref 80.0–100.0)
Platelets: 310 10*3/uL (ref 150–400)
RBC: 4.37 MIL/uL (ref 3.87–5.11)
RDW: 15.2 % (ref 11.5–15.5)
WBC: 8.7 10*3/uL (ref 4.0–10.5)
nRBC: 0 % (ref 0.0–0.2)

## 2022-01-18 LAB — COMPREHENSIVE METABOLIC PANEL
ALT: 35 U/L (ref 0–44)
AST: 22 U/L (ref 15–41)
Albumin: 3.7 g/dL (ref 3.5–5.0)
Alkaline Phosphatase: 145 U/L — ABNORMAL HIGH (ref 38–126)
Anion gap: 10 (ref 5–15)
BUN: 20 mg/dL (ref 6–20)
CO2: 25 mmol/L (ref 22–32)
Calcium: 9.4 mg/dL (ref 8.9–10.3)
Chloride: 103 mmol/L (ref 98–111)
Creatinine, Ser: 1.07 mg/dL — ABNORMAL HIGH (ref 0.44–1.00)
GFR, Estimated: >60 mL/min (ref >60)
Glucose, Bld: 203 mg/dL — ABNORMAL HIGH (ref 70–99)
Potassium: 3.9 mmol/L (ref 3.5–5.1)
Sodium: 138 mmol/L (ref 135–145)
Total Bilirubin: 0.7 mg/dL (ref 0.3–1.2)
Total Protein: 8 g/dL (ref 6.5–8.1)

## 2022-01-18 LAB — PROTIME-INR
INR: 1 (ref 0.8–1.2)
Prothrombin Time: 12.6 seconds (ref 11.4–15.2)

## 2022-01-18 LAB — DIFFERENTIAL
Abs Immature Granulocytes: 0.02 10*3/uL (ref 0.00–0.07)
Basophils Absolute: 0 10*3/uL (ref 0.0–0.1)
Basophils Relative: 0 %
Eosinophils Absolute: 0.2 10*3/uL (ref 0.0–0.5)
Eosinophils Relative: 2 %
Immature Granulocytes: 0 %
Lymphocytes Relative: 37 %
Lymphs Abs: 3.2 10*3/uL (ref 0.7–4.0)
Monocytes Absolute: 0.3 10*3/uL (ref 0.1–1.0)
Monocytes Relative: 4 %
Neutro Abs: 5 10*3/uL (ref 1.7–7.7)
Neutrophils Relative %: 57 %

## 2022-01-18 LAB — APTT: aPTT: 25 seconds (ref 24–36)

## 2022-01-18 LAB — CBG MONITORING, ED: Glucose-Capillary: 215 mg/dL — ABNORMAL HIGH (ref 70–99)

## 2022-01-18 MED ORDER — SODIUM CHLORIDE 0.9% FLUSH
3.0000 mL | Freq: Once | INTRAVENOUS | Status: DC
  Filled 2022-01-18: qty 3

## 2022-01-18 MED ORDER — DIPHENHYDRAMINE HCL 50 MG/ML IJ SOLN
25.0000 mg | Freq: Once | INTRAMUSCULAR | Status: AC
  Administered 2022-01-18: 25 mg via INTRAVENOUS
  Filled 2022-01-18: qty 1

## 2022-01-18 MED ORDER — PROCHLORPERAZINE EDISYLATE 10 MG/2ML IJ SOLN
10.0000 mg | Freq: Once | INTRAMUSCULAR | Status: AC
  Administered 2022-01-18: 10 mg via INTRAVENOUS
  Filled 2022-01-18: qty 2

## 2022-01-18 MED ORDER — LACTATED RINGERS IV BOLUS
1000.0000 mL | Freq: Once | INTRAVENOUS | Status: AC
  Administered 2022-01-18: 1000 mL via INTRAVENOUS

## 2022-01-18 NOTE — Consult Note (Signed)
[facility] Telemedicine Consult ? ? ?Requesting Provider: Thamas Jaegers ?Consult Participants: patient, bedside Rn Sophie ?Location of the provider: [facility] ?Location of the patient: [facility] ? ?This consult was provided via telemedicine with 2-way video and audio communication. The patient/family was informed that care would be provided in this way and agreed to receive care in this manner.  Due to technical difficulties I could see the patient via video but patient could not hear me and I could not hear the patient hence this consultation was done as a hybrid with telephonic audio and video consultation. ? ? ?Chief Complaint: Leaning to the left and dizzy ? ?HPI: [name] is a 56 year old African-American lady with diabetes, hypertension, obesity and remote history of stroke at age 62 more than 20 years ago.  She states she did not have significant dorsal deficits.  She was in her usual state of health until last night when she went to sleep.  This morning when she woke up she did not feel right she was leaning to the left and complained of some numbness on the left.  Symptoms have persisted since then.  She admitted to mild headache which is not incapacitating.  She denies any nausea vomiting blurred vision, double vision, vertigo, focal extremity weakness with significant gait difficulty.  She is unable to give me specific details about her remote stroke and review of electronic medical records does not show any mention of stroke in her previous visits. ? ? ? ?LKW: While going to sleep on 01/18/2019 3 at night. ?tpa given?: No, presented outside window and symptoms to mild ?IR Thrombectomy? No, not an LVO by presentation ?Modified Rankin Scale: 1-No significant post stroke disability and can perform usual duties with stroke symptoms ?Time of teleneurologist evaluation: 4:30 PM ? ?Exam: ?Vitals:  ? 01/18/22 1550 01/18/22 1636  ?BP: (!) 151/82 139/80  ?Pulse: (!) 121  (!) 114  ?Resp: 18 16  ?Temp: 98.2 ?F (36.8 ?C)   ?SpO2: 98% 95%  ? ? ?General: Obese middle-aged African-American lady not in distress. ? . Afebrile. Head is nontraumatic. Neck is supple without bruit.    Cardiac exam no murmur or gallop. Lungs are clear to auscultation. Distal pulses are well felt.  ?1A: Level of Consciousness - 0 ?1B: Ask Month and Age - 0 ?1C: 'Blink Eyes' & 'Squeeze Hands' - 0 ?2: Test Horizontal Extraocular Movements - 0 ?3: Test Visual Fields - 1 partial left superior quadrantanopsia ?4: Test Facial Palsy - 0 ?5A: Test Left Arm Motor Drift - 0 ?5B: Test Right Arm Motor Drift - 0 ?6A: Test Left Leg Motor Drift - 0 ?6B: Test Right Leg Motor Drift - 0 ?7: Test Limb Ataxia - 0 ?8: Test Sensation - 1 ?9: Test Language/Aphasia- 0 ?10: Test Dysarthria - 0 ?11: Test Extinction/Inattention - 0 ?NIHSS score: 2 ? ? 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  No acute abnormality. ? ?Labs reviewed in epic and pertinent values follow: ?CMP significant only for elevated sugar of 203 mg percent and alkaline phosphatase of 145.  CBC is unremarkable ? ? ?Assessment: 56 year old African-American lady with sudden onset of dizziness with leaning to the left with left hemisensory loss possibly due to small right subcortical infarct likely from small vessel disease.  CT head shows no acute abnormalities but shows old right MCA and questionable left cerebellar infarct. ?Vascular risk factors of  obesity, diabetes, hypertension ?Recommendations: Recommend MRI scan of the brain to look for small subcortical or brainstem infarct and if positive will need to be admitted for further stroke work-up.  Patient has presented outside time window for consideration for thrombolysis and clinical presentation and exam does not seem compatible with large vessel occlusion.  Since MRI facility is not available at [facility] patient  will be transferred to [facility], ER for further evaluation and disposition and treatment consideration.  Discussed with Dr. Nanda Quinton, ER, MD. ? ? ? ?This patient is receiving care for possible acute neurological changes. There was 60 minutes of care by this provider at the time of service, including time for direct evaluation via telemedicine, review of medical records, imaging studies and discussion of findings with providers, the patient and/or family. ? ?Antony Contras MD ?[facility] ?[phone] ? ?If 7pm- 7am, please page neurology on call as listed in [facility].  ?

## 2022-01-18 NOTE — ED Notes (Signed)
Patient transported to MRI 

## 2022-01-18 NOTE — Progress Notes (Signed)
Code stroke activation by site at 1602. ?Neurologist paged at [phone] via [place] ?Dr Leonie Man on cart for stroke assessment at 22.  ?NIH 2 ?NO TNK d/t >4.5hr window.  ?

## 2022-01-18 NOTE — ED Triage Notes (Addendum)
Pt is ambulatory to triage. Pt reports eating lunch approx 100pm and daughter noticed left facial droop. Also noticed swelling to left side of face. Decreased sensation to left side of face, arm and leg. Mild HA to left side. Reports Dizziness when she woke up. Pt drove herself to ED and able to walk in independently. ?

## 2022-01-18 NOTE — Discharge Instructions (Addendum)
You were seen in the emergency department for evaluation of dizziness.  He was supposed to receive an MRI today but unfortunately you stated you were unable to perform this test and are requesting to be discharged with your primary care provider follow-up.  This is not unreasonable as your symptoms appear to have resolved but is very important that if your symptoms return you need to return to the emergency department immediately for repeat MRI. ?

## 2022-01-18 NOTE — ED Notes (Signed)
Pt encouraged to stay by daughter. MRI arrives shortly after to transport. ?

## 2022-01-18 NOTE — ED Notes (Signed)
Speaking to neurologist via phone at bedside ?

## 2022-01-18 NOTE — ED Notes (Signed)
ED in to triage to assess patient ?

## 2022-01-18 NOTE — ED Provider Notes (Signed)
We have ?Physical Exam  ?BP 124/71 (BP Location: Right Arm)   Pulse (!) 116   Temp 98.2 ?F (36.8 ?C) (Oral)   Resp 20   Wt 95.3 kg   SpO2 99%   BMI 32.89 kg/m?  ? ?Physical Exam ?Vitals and nursing note reviewed.  ?Constitutional:   ?   General: She is not in acute distress. ?   Appearance: She is well-developed.  ?HENT:  ?   Head: Normocephalic and atraumatic.  ?Eyes:  ?   Conjunctiva/sclera: Conjunctivae normal.  ?Cardiovascular:  ?   Rate and Rhythm: Normal rate and regular rhythm.  ?   Heart sounds: No murmur heard. ?Pulmonary:  ?   Effort: Pulmonary effort is normal. No respiratory distress.  ?   Breath sounds: Normal breath sounds.  ?Abdominal:  ?   Palpations: Abdomen is soft.  ?   Tenderness: There is no abdominal tenderness.  ?Musculoskeletal:     ?   General: No swelling.  ?   Cervical back: Neck supple.  ?Skin: ?   General: Skin is warm and dry.  ?   Capillary Refill: Capillary refill takes less than 2 seconds.  ?Neurological:  ?   Mental Status: She is alert.  ?   Cranial Nerves: No cranial nerve deficit.  ?   Sensory: No sensory deficit.  ?   Motor: No weakness.  ?Psychiatric:     ?   Mood and Affect: Mood normal.  ? ? ?Procedures  ?Procedures ? ?ED Course / MDM  ?  ?Medical Decision Making ?Amount and/or Complexity of Data Reviewed ?Labs: ordered. ?Radiology: ordered. ? ?Risk ?Prescription drug management. ? ? ?Patient sent from [facility] due to concern for persistent dizziness and ataxia.  Symptoms have resolved on arrival but sent for MRI.  On patient arrival to MRI, patient refused to have the scan done and states "I just could not do it".  When I reevaluated the patient, her symptoms still have resolved and she is requesting to be discharged with outpatient follow-up.  I explained to her the risks of missing a possible stroke and she still would like to be discharged from the emergency department with outpatient follow-up.  At that time, I gave her strict return precautions of  which she voiced understanding and she was discharged via informed discharge with outpatient follow-up. ? ? ? ? ?  ?Teressa Lower, MD ?01/18/22 2252 ? ?

## 2022-01-18 NOTE — ED Notes (Signed)
Report given to [place] with [place]  ?

## 2022-01-18 NOTE — ED Notes (Signed)
Attempting Tele neuro consult, difficulty with sound  ?

## 2022-01-18 NOTE — ED Notes (Signed)
Report given to [facility] ED ?

## 2022-01-18 NOTE — ED Notes (Signed)
Pt bib [facility] from [facility] for MRI. Pt presented initially for dizziness and left sided facial droop. LKW 1300. Hx of CVA. Per [facility], CT negative, VSS, NIH 0.  ?

## 2022-01-25 ENCOUNTER — Other Ambulatory Visit: Payer: Self-pay | Admitting: Medical-Surgical

## 2022-01-26 ENCOUNTER — Other Ambulatory Visit: Payer: Self-pay | Admitting: Medical-Surgical

## 2022-02-02 ENCOUNTER — Ambulatory Visit (INDEPENDENT_AMBULATORY_CARE_PROVIDER_SITE_OTHER): Payer: Managed Care, Other (non HMO) | Admitting: Podiatry

## 2022-02-02 ENCOUNTER — Ambulatory Visit (INDEPENDENT_AMBULATORY_CARE_PROVIDER_SITE_OTHER): Payer: Managed Care, Other (non HMO)

## 2022-02-02 DIAGNOSIS — S92315K Nondisplaced fracture of first metatarsal bone, left foot, subsequent encounter for fracture with nonunion: Secondary | ICD-10-CM

## 2022-02-02 DIAGNOSIS — Z9889 Other specified postprocedural states: Secondary | ICD-10-CM

## 2022-02-02 DIAGNOSIS — M960 Pseudarthrosis after fusion or arthrodesis: Secondary | ICD-10-CM

## 2022-02-06 NOTE — Progress Notes (Signed)
Subjective: ?Helen Perez is a 56 y.o. is seen today in office for follow evaluation after undergoing left Lisfranc arthrodesis performed on October 16, 2021.  She states that she is doing well she is not having significant pain.  The swelling is still present but improving.  She feels that she has come along with this foot and is feeling better.  No fevers or chills.  No other concerns.  ? ? ?Objective: ?General: No acute distress, AAOx3  ?DP/PT pulses palpable 2/4, CRT < 3 sec to all digits.  ?Protective sensation intact. Motor function intact.  ?Left foot: Incision is well coapted without any evidence of dehiscence and scars are formed.  There is still some mild edema but there is no erythema or warmth.  The swelling appears to be improving.  There is no significant discomfort along the surgical site although she does have neuropathy.  No pain along the fourth and the fifth metatarsal cuboid joint.  Hammertoes are present.  When she gets discomfort is mostly to the hammertoes.  ?No other open lesions or pre-ulcerative lesions.  ?No pain with calf compression, swelling, warmth, erythema.  ? ? 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 ?-Treatment options discussed including all alternatives, risks, and complications ?-3 views of the left foot were obtained and reviewed with the patient.  Status post first, second and third metatarsal cuneiform joint arthrodesis with hardware intact there is increased consolidation noted across the arthrodesis sites. ?-At this time we discussed gradually increasing to weightbearing in the cam boot.  This partial weightbearing and full weightbearing in the boot.  She can be on her feet for some distance then would still recommend using the knee scooter.  Continue ice, elevate as well as compression of any postoperative edema. ? ?Return in about 2 weeks (around 02/16/2022).  Repeat x-ray ? ?Trula Slade DPM ?

## 2022-02-16 ENCOUNTER — Ambulatory Visit (INDEPENDENT_AMBULATORY_CARE_PROVIDER_SITE_OTHER): Payer: Managed Care, Other (non HMO)

## 2022-02-16 ENCOUNTER — Ambulatory Visit (INDEPENDENT_AMBULATORY_CARE_PROVIDER_SITE_OTHER): Payer: Managed Care, Other (non HMO) | Admitting: Podiatry

## 2022-02-16 ENCOUNTER — Other Ambulatory Visit: Payer: Self-pay | Admitting: Internal Medicine

## 2022-02-16 DIAGNOSIS — Z9889 Other specified postprocedural states: Secondary | ICD-10-CM

## 2022-02-16 DIAGNOSIS — S92315K Nondisplaced fracture of first metatarsal bone, left foot, subsequent encounter for fracture with nonunion: Secondary | ICD-10-CM

## 2022-02-16 DIAGNOSIS — M960 Pseudarthrosis after fusion or arthrodesis: Secondary | ICD-10-CM | POA: Diagnosis not present

## 2022-02-18 ENCOUNTER — Encounter: Payer: Self-pay | Admitting: Medical-Surgical

## 2022-02-18 ENCOUNTER — Ambulatory Visit (INDEPENDENT_AMBULATORY_CARE_PROVIDER_SITE_OTHER): Payer: Managed Care, Other (non HMO) | Admitting: Medical-Surgical

## 2022-02-18 VITALS — BP 132/89 | HR 120 | Resp 20 | Ht 67.0 in | Wt 214.7 lb

## 2022-02-18 DIAGNOSIS — R21 Rash and other nonspecific skin eruption: Secondary | ICD-10-CM

## 2022-02-18 DIAGNOSIS — Z09 Encounter for follow-up examination after completed treatment for conditions other than malignant neoplasm: Secondary | ICD-10-CM | POA: Diagnosis not present

## 2022-02-18 DIAGNOSIS — B3731 Acute candidiasis of vulva and vagina: Secondary | ICD-10-CM | POA: Diagnosis not present

## 2022-02-18 MED ORDER — CLINDAMYCIN PHOSPHATE 1 % EX SOLN: Freq: Two times a day (BID) | CUTANEOUS | 0 refills | Status: DC

## 2022-02-18 MED ORDER — TOPIRAMATE 25 MG PO TABS: ORAL_TABLET | ORAL | 0 refills | Status: DC

## 2022-02-18 MED ORDER — NYSTATIN POWD: 1.0000 "application " | Freq: Three times a day (TID) | 3 refills | Status: DC | PRN

## 2022-02-18 MED ORDER — FLUCONAZOLE 150 MG PO TABS: 150.0000 mg | ORAL_TABLET | ORAL | 0 refills | Status: DC

## 2022-02-18 NOTE — Progress Notes (Signed)
Subjective: ?Helen Perez is a 56 y.o. is seen today in office for follow evaluation after undergoing left Lisfranc arthrodesis performed on October 16, 2021.  She said that she has been doing well.  She has been walking some in the cam boot around the house for short distances.  Not made any increase in pain or swelling.  She is eager to get back into a regular shoe.  She does get some discomfort on the hammertoes but from the surgical perspective she has been doing well.  She denies any fevers or chills.  ? ?Objective: ?General: No acute distress, AAOx3  ?DP/PT pulses palpable 2/4, CRT < 3 sec to all digits.  ?Protective sensation intact. Motor function intact.  ?Left foot: Incision is well coapted without any evidence of dehiscence and scars are formed.  Mild edema still present but appears to be improving slowly.  There is no erythema or warmth.  No tenderness palpation of the surgical site.  Hammertoes are present. ?No other open lesions or pre-ulcerative lesions.  ?No pain with calf compression, swelling, warmth, erythema.  ? ? 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 ?-Treatment options discussed including all alternatives, risks, and complications ?-3 views of the left foot were obtained and reviewed with the patient.  Status post first, second and third metatarsal cuneiform joint arthrodesis with hardware intact.  Radiolucent line still evident consistent with nonunion of the fracture gap of less than 1 cm. ?-At this point I would remain in the cam boot.  Discussed that she can transition to partial weightbearing and walking in the boot.  I recommend holding off on wearing regular shoes at this time.  Continue mobilization.  Also given the nonunion will order a bone stimulator to help facilitate healing. This was ordered through [facility].  ? ?Return in about 3 weeks (around 03/09/2022). ? ?Trula Slade DPM ?

## 2022-02-18 NOTE — Progress Notes (Signed)
?  HPI with pertinent ROS:  ? ?CC: hospital follow up ? ?HPI: ?Pleasant 56 year old female presenting today for the following: ? ?Hospital discharge follow up: was taken to the hospital by her daughter who reported seeing a facial droop in the setting of a headache. On evaluation at the ED,  her CT was normal and there was no facial drooping notified. They gave her a migraine cocktail which was helpful but also wanted to do a MRI for further evaluation. She was unable to tolerate the MRI and ended up leaving the hospital with no further intervention. Notes regular headaches occurring nearly every day. Usually does not treat them and waits for them to go away. Often smells ashes about 20 minutes before the headache starts. At times, when the headache gets bad, she will have nausea/vomiting, photophobia.  ? ?Has a rash in the axillary region bilaterally. This has been present for several weeks and she has been trying to manage at home by avoiding deodorant and using powder instead. Notes the area is very itchy and seems to be getting worse rather than better. Endorses a history of frequent boils/abscesses to the axillary and groin areas. Never treated for HS before.  ? ?Frequent vaginal yeast infections secondary to uncontrolled diabetes. Requesting Diflucan. ? ?I reviewed the past medical history, family history, social history, surgical history, and allergies today and no changes were needed.  Please see the problem list section below in epic for further details. ? ? ?Physical exam:  ? ?General: Well Developed, well nourished, and in no acute distress.  ?Neuro: Alert and oriented x3.  ?HEENT: Normocephalic, atraumatic.  ?Skin: Warm and dry. Bilateral axillary rash difficult to assess due to presence of powder.  ?Cardiac: Regular rate and rhythm, no murmurs rubs or gallops, no lower extremity edema.  ?Respiratory: Clear to auscultation bilaterally. Not using accessory muscles, speaking in full sentences. ? ?Impression  and Recommendations:   ? ?1. Hospital discharge follow-up ?Hospital records reviewed. Symptoms suspicious for migraines with aura but also consider chronic daily headache. No neuro deficit or facial dropping today. Starting Topamax for migraine prevention since this may also help with appetite suppression and weight loss.  ? ?2. Rash and nonspecific skin eruption ?Nystatin powder BID to the affected area. Apply after cleansing with Clindamycin solution. Suspect a diagnosis of HS. ? ?3. Vaginal yeast infection ?Since we have treated multiple times for recurrent vaginal yeast infections and her glucose control is not at goal, plan to treat with a longer course. Diflucan '150mg'$  weekly for the next 6 weeks with plan to follow up at her next visit regarding symptoms.  ? ?Return in about 6 weeks (around 04/01/2022) for headache follow up. ?___________________________________________ ?Clearnce Sorrel, DNP, APRN, FNP-BC ?Primary Care and Sports Medicine ?[place] ?

## 2022-02-19 ENCOUNTER — Telehealth: Payer: Self-pay

## 2022-02-19 MED ORDER — TOPIRAMATE 25 MG PO TABS: ORAL_TABLET | ORAL | 0 refills | Status: DC

## 2022-02-19 NOTE — Telephone Encounter (Signed)
Resent Topamax with corrected instructions.  ? ?___________________________________________ ?Clearnce Sorrel, DNP, APRN, FNP-BC ?Primary Care and Sports Medicine ?[place] ? ?

## 2022-02-19 NOTE — Telephone Encounter (Signed)
The pharmacy called and left a message requesting clarification for the directions for topiramate. Please advise.  ?

## 2022-02-22 ENCOUNTER — Other Ambulatory Visit: Payer: Self-pay | Admitting: Internal Medicine

## 2022-02-26 ENCOUNTER — Other Ambulatory Visit: Payer: Self-pay | Admitting: Medical-Surgical

## 2022-03-01 ENCOUNTER — Ambulatory Visit: Payer: Managed Care, Other (non HMO) | Admitting: Medical-Surgical

## 2022-03-03 ENCOUNTER — Encounter: Payer: Self-pay | Admitting: Internal Medicine

## 2022-03-03 ENCOUNTER — Ambulatory Visit (INDEPENDENT_AMBULATORY_CARE_PROVIDER_SITE_OTHER): Payer: Managed Care, Other (non HMO) | Admitting: Internal Medicine

## 2022-03-03 VITALS — BP 130/76 | HR 99 | Ht 67.0 in | Wt 208.0 lb

## 2022-03-03 DIAGNOSIS — R739 Hyperglycemia, unspecified: Secondary | ICD-10-CM

## 2022-03-03 DIAGNOSIS — E1140 Type 2 diabetes mellitus with diabetic neuropathy, unspecified: Secondary | ICD-10-CM | POA: Diagnosis not present

## 2022-03-03 LAB — POCT GLYCOSYLATED HEMOGLOBIN (HGB A1C): Hemoglobin A1C: 8 % — AB (ref 4.0–5.6)

## 2022-03-03 NOTE — Patient Instructions (Signed)
?-   Continue  Toujeo/ Tresiba  34 units daily  ?- Novolog 20 units with Breakfast and 20 units Lunch and 24 units with Supper ?- Continue Ozempic 2 mg weekly  ? ? ? ? ? ? ? ?HOW TO TREAT LOW BLOOD SUGARS (Blood sugar LESS THAN 70 MG/DL) ?Please follow the RULE OF 15 for the treatment of hypoglycemia treatment (when your (blood sugars are less than 70 mg/dL)  ? ?STEP 1: Take 15 grams of carbohydrates when your blood sugar is low, which includes:  ?3-4 GLUCOSE TABS  OR ?3-4 OZ OF JUICE OR REGULAR SODA OR ?ONE TUBE OF GLUCOSE GEL   ? ?STEP 2: RECHECK blood sugar in 15 MINUTES ?STEP 3: If your blood sugar is still low at the 15 minute recheck --> then, go back to STEP 1 and treat AGAIN with another 15 grams of carbohydrates. ? ?

## 2022-03-03 NOTE — Progress Notes (Signed)
Name: Helen Perez  Age/ Sex: 56 y.o., female   MRN/ DOB: [id number], 1966/06/17     PCP: Samuel Bouche, NP   Reason for Endocrinology Evaluation: Type 2 Diabetes Mellitus  Initial Endocrine Consultative Visit: 01/30/2020    PATIENT IDENTIFIER: Helen Perez is a 56 y.o. female with a past medical history of HTN, TIA, OSA and T2DM. The patient has followed with Endocrinology clinic since 01/30/2020 for consultative assistance with management of her diabetes.  DIABETIC HISTORY:  Helen Perez was diagnosed with T2DM in 2000. She reports intolerance to Metformin.  She has been on an insulin pump for years.  Her hemoglobin A1c has ranged from 10.1% in 12/10/2019, peaking at 15.7% in 2016.  On her initial visit to our clinic her A1c was 12.5% , she was on Ozempic and novolog through insulin pump. We did not make any changes as she  Uses the pump only periodically as well as the Decox. Chronic hx of non-compliance, and was given the option to switch to MDI vs using the pump properly , she opted to continue with the pump at the time.     By 04/2020 we stopped insulin pump and started MDI regimen   Farxiga started 10/2020 and stopped by 02/2021 due to worsening genital infections   SUBJECTIVE:   During the last visit (07/10/2021): A1c 8.6% . Adjusted MDI regimen and continued  Ozempic     Today (03/03/2022): Helen Perez is here for a follow up on diabetes  She checks her blood sugars multiple  times daily, through the dexcom. The patient has not had hypoglycemic episodes since the last clinic visit.   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, S/P left foot sx 11/2021 Presented to the ED 01/2022 for ataxia for TIA   Denies nausea, vomiting or diarrhea     HOME DIABETES REGIMEN:  - Toujeo  34 units once daily  - Novolog 20 units with Breakfast and 20 units with  Lunch and 22 units with Supper  - Ozempic 2 mg weekly  - Novolog (BG-130/20)     Statin: Yes ACE-I/ARB:  yes     CONTINUOUS GLUCOSE MONITORING RECORD INTERPRETATION    Dates of Recording:5/3-5/16/2023   Sensor description:Dexcom  Results statistics:   CGM use % of time  93%  Average and SD 184/55  Time in range  55  % Time Above 180 34  % Time above 250 10  % Time Below target <1       Glycemic patterns summary: Bg's optimal during the night, hyperglycemia noted during the day  Hyperglycemic episodes  postprandial  Hypoglycemic episodes occurred followung a bolus   Overnight periods: trends down     INpen Report : May/3-May/16/2023 Average 206 Missed doses 32 SD 73     DIABETIC COMPLICATIONS: Microvascular complications:  Neuropathy Denies: retinopathy,  CKD Last eye exam: Completed 2021   Macrovascular complications:  CVA Denies: CAD, PVD    HISTORY:  Past Medical History:  Past Medical History:  Diagnosis Date   Anxiety    Depression    Diabetes mellitus without complication ([place])    H/O syphilis    HSV-2 infection    Hypertension associated with diabetes ([place]) 03/30/2018   Renal insufficiency 10/13/2018   Sleep apnea    CPAP   Stroke [facility])    Past Surgical History:  Past Surgical History:  Procedure Laterality Date   ABDOMINAL HYSTERECTOMY     FOOT SURGERY Right    Social History:  reports that she quit smoking about 9 years ago. Her smoking use included cigarettes. She has never used smokeless tobacco. She reports current alcohol use. She reports that she does not use drugs. Family History:  Family History  Problem Relation Age of Onset   Diabetes Mother    Heart disease Mother    Heart failure Mother    Diabetes Sister    Kidney failure Sister    Other Brother        quadraplegia   Diabetes Maternal Grandmother    Diabetes Brother    Kidney failure Brother    Diabetes Brother    Kidney failure Father    Colon cancer Neg Hx    Esophageal cancer Neg Hx    Pancreatic cancer Neg Hx    Stomach cancer Neg Hx    Liver disease Neg  Hx      HOME MEDICATIONS: Allergies as of 03/03/2022       Reactions   Adhesive [tape]    From dexcom   Latex Itching, Swelling   Other Dermatitis   FREESTYLE LIBRE SENSOR- cellulitis, wound on skin         Medication List        Accurate as of Mar 03, 2022  1:53 PM. If you have any questions, ask your nurse or doctor.          acetaminophen 650 MG CR tablet Commonly known as: TYLENOL Take 1 tablet by mouth as needed.   acyclovir ointment 5 % Commonly known as: ZOVIRAX Apply topically.   amitriptyline 25 MG tablet Commonly known as: ELAVIL Take 1 tablet (25 mg total) by mouth at bedtime.   aspirin EC 81 MG tablet Take 1 tablet by mouth daily.   atorvastatin 40 MG tablet Commonly known as: LIPITOR TAKE ONE TABLET BY MOUTH DAILY   buPROPion HCl ER (XL) 450 MG Tb24 Take 450 mg by mouth daily.   cephALEXin 500 MG capsule Commonly known as: KEFLEX Take 1 capsule (500 mg total) by mouth 3 (three) times daily.   clindamycin 1 % external solution Commonly known as: Cleocin-T Apply topically 2 (two) times daily.   Dexcom G6 Receiver Devi USE AS INSTRUCTED TO CHECK BLOOD SUGAR DAILY   Dexcom G6 Sensor Misc INSERT 1 DEVICE INTO THE SKIN EVERY 10 DAYS   Dexcom G6 Transmitter Misc USE 1 EVERY 3 MONTHS AS DIRECTED   fexofenadine-pseudoephedrine 180-240 MG 24 hr tablet Commonly known as: ALLEGRA-D 24 Take 1 tablet by mouth daily as needed.   fluconazole 150 MG tablet Commonly known as: DIFLUCAN Take 1 tablet (150 mg total) by mouth once a week.   furosemide 20 MG tablet Commonly known as: LASIX TAKE 1 TABLET BY MOUTH DAILY. IF SIGNIFICANT SWELLING THAT IS NOT RESPONDING TO ELEVATION AND COMPRESSION, TAKE 1 EXTRA TABLET BY MOUTH NO MORE THAN 3 TIMES WEEKLY.   HYDROcodone-acetaminophen 5-325 MG tablet Commonly known as: NORCO/VICODIN Take 1-2 tablets by mouth every 6 (six) hours as needed.   InPen 100-Blue-Novolog-Fiasp Devi Generic drug: injection  device for insulin Max 80 units daily   insulin aspart cartridge Commonly known as: NOVOLOG Max daily 80 units   Insulin Pen Needle 29G X 5MM Misc 1 Device by Does not apply route as directed.   lisinopril 5 MG tablet Commonly known as: ZESTRIL Take 1 tablet by mouth daily.   meloxicam 15 MG tablet Commonly known as: MOBIC Take 15 mg by mouth daily.   mupirocin ointment 2 % Commonly known as:  BACTROBAN Apply 1 application topically. 2-3 times daily   NONFORMULARY OR COMPOUNDED ITEM Antifungal solution: Terbinafine 3%, Fluconazole 2%, Tea Tree Oil 5%, Urea 10%, Ibuprofen 2% in DMSO suspension #72m   Nystatin Powd Apply 1 application. topically 3 (three) times daily as needed.   omeprazole 20 MG capsule Commonly known as: PRILOSEC Take 1 capsule by mouth daily.   oxyCODONE-acetaminophen 5-325 MG tablet Commonly known as: PERCOCET/ROXICET Take 1-2 tablets by mouth every 6 (six) hours as needed for severe pain.   Ozempic (2 MG/DOSE) 8 MG/3ML Sopn Generic drug: Semaglutide (2 MG/DOSE) Inject 2 mg into the skin once a week.   promethazine 25 MG tablet Commonly known as: PHENERGAN Take 1 tablet (25 mg total) by mouth every 8 (eight) hours as needed for nausea or vomiting.   senna-docusate 8.6-50 MG tablet Commonly known as: Senokot-S Take by mouth.   Skin Tac Adhesive Barrier Wipe Misc 1 applicator by Does not apply route as directed.   topiramate 25 MG tablet Commonly known as: Topamax Take 1 tablet (25 mg total) by mouth daily for 7 days, THEN 1 tablet (25 mg total) 2 (two) times daily for 7 days, THEN 2 tablets (50 mg total) 2 (two) times daily for 16 days. Start taking on: Feb 19, 2022   Toujeo Max SoloStar 300 UNIT/ML Solostar Pen Generic drug: insulin glargine (2 Unit Dial) Inject 34 Units into the skin daily.   triamcinolone 55 MCG/ACT Aero nasal inhaler Commonly known as: NASACORT Place 2 sprays into the nose daily.   triamcinolone cream 0.5  % Commonly known as: KENALOG Apply 1 application topically 2 (two) times daily. To affected areas.   Trulance 3 MG Tabs Generic drug: Plecanatide TAKE 1 TABLET BY MOUTH DAILY AS NEEDED   valACYclovir 500 MG tablet Commonly known as: VALTREX TAKE ONE TABLET BY MOUTH DAILY   Vitamin D (Ergocalciferol) 1.25 MG (50000 UNIT) Caps capsule Commonly known as: DRISDOL TAKE 1 CAPSULE BY MOUTH EVERY 7 DAYS   zolpidem 12.5 MG CR tablet Commonly known as: AMBIEN CR TAKE ONE TABLET BY MOUTH EVERY NIGHT AT BEDTIME AS NEEDED FOR SLEEP         OBJECTIVE:   Vital Signs: BP 130/76 (BP Location: Left Arm, Patient Position: Sitting, Cuff Size: Small)   Pulse 99   Ht '5\' 7"'$  (1.702 m)   Wt 208 lb (94.3 kg)   SpO2 97%   BMI 32.58 kg/m   Wt Readings from Last 3 Encounters:  03/03/22 208 lb (94.3 kg)  02/18/22 214 lb 11.2 oz (97.4 kg)  01/18/22 210 lb (95.3 kg)     Exam: General: Pt appears well and is in NAD  Lungs: Clear with good BS bilat with no rales, rhonchi, or wheezes  Heart: RRR   Extremities: No pretibial edema. Left foot boot in place   Neuro: MS is good with appropriate affect, pt is alert and Ox3     DATA REVIEWED:  Lab Results  Component Value Date   HGBA1C 8.0 (A) 03/03/2022   HGBA1C 7.8 (H) 10/02/2021   HGBA1C 8.1 (A) 08/14/2021   Lab Results  Component Value Date   MICROALBUR '150mg'$  05/26/2021   LDLCALC 58 02/04/2021   CREATININE 1.07 (H) 01/18/2022   Lab Results  Component Value Date   MICRALBCREAT 30-'300mg'$ /g 05/26/2021     Lab Results  Component Value Date   CHOL 129 02/04/2021   HDL 52 02/04/2021   LDLCALC 58 02/04/2021   TRIG 108 02/04/2021   CHOLHDL  2.5 02/04/2021         ASSESSMENT / PLAN / RECOMMENDATIONS:   1) Type 2 Diabetes Mellitus, with improving glycemic control, With neuropathic and macrovascular  complications - Most recent A1c of 8.0 %. Goal A1c < 7.0 %.    - A1c stable but continues to be above goal - She is having issues  with insurance coverage and is told she has no insurance coverage and is fighting to obtain that  - She was provided with #1 Tyler Aas pen, novolog and Ozempic 2 mg  - We discussed switching Ozempic to [employer] once insurance issue has been sorted out  - Will reduce supper prandial insulin due to post-supper hypoglycemia   MEDICATIONS: -Continue Toujeo/ tresiba  34 units once daily  -Change Novolog to 20 units with Breakfast, 20 units with lunch and 20 units with Supper  -Continue Ozempic 2 mg weekly    EDUCATION / INSTRUCTIONS: BG monitoring instructions: Patient is instructed to check her blood sugars 4 times a day, before meals and bedtime . Call [facility] if: BG persistently < 70  I reviewed the Rule of 15 for the treatment of hypoglycemia in detail with the patient. Literature supplied.    2) Diabetic complications:  Eye: Does not have known diabetic retinopathy.  Neuro/ Feet: Does have known diabetic peripheral neuropathy .  Renal: Patient does not have known baseline CKD. She   is  on an ACEI/ARB at present.      F/U in 6 months    Signed electronically by: Mack Guise, MD  [facility] Endocrinology  [facility] [address] [place], [place] [address] Phone: [phone] FAX: [phone]   CC: Samuel Bouche, [address] [place] [address] Phone: [phone]  Fax: [phone]  Return to Endocrinology clinic as below: Future Appointments  Date Time Provider [place]  03/12/2022  7:30 AM Trula Slade, DPM [facility]  04/01/2022  3:00 PM Samuel Bouche, NP [facility] None

## 2022-03-04 ENCOUNTER — Encounter: Payer: Self-pay | Admitting: Internal Medicine

## 2022-03-08 ENCOUNTER — Encounter: Payer: Self-pay | Admitting: Internal Medicine

## 2022-03-08 ENCOUNTER — Ambulatory Visit: Payer: Managed Care, Other (non HMO) | Admitting: Internal Medicine

## 2022-03-12 ENCOUNTER — Ambulatory Visit (INDEPENDENT_AMBULATORY_CARE_PROVIDER_SITE_OTHER): Payer: Managed Care, Other (non HMO)

## 2022-03-12 ENCOUNTER — Ambulatory Visit (INDEPENDENT_AMBULATORY_CARE_PROVIDER_SITE_OTHER): Payer: Managed Care, Other (non HMO) | Admitting: Podiatry

## 2022-03-12 DIAGNOSIS — Z9889 Other specified postprocedural states: Secondary | ICD-10-CM

## 2022-03-12 DIAGNOSIS — M960 Pseudarthrosis after fusion or arthrodesis: Secondary | ICD-10-CM

## 2022-03-12 NOTE — Progress Notes (Addendum)
Subjective: Helen Perez is a 56 y.o. is seen today in office for follow evaluation after undergoing left Lisfranc arthrodesis performed on October 16, 2021.  She has been doing well and her swelling is much improved.  She is not having any pain.  She is also not gone back to her regular shoe.  No recent injury or changes.  She has no new concerns.  No fevers or chills.   Objective: General: No acute distress, AAOx3  DP/PT pulses palpable 2/4, CRT < 3 sec to all digits.  Protective sensation decreased (unchanged) Left foot: Incision is well coapted without any evidence of dehiscence and scars are formed.  There is minimal and much improved edema to the foot there is no erythema or warmth.  There is no tenderness to palpation at surgical site.  Ankle joint range of motion intact.  Hammertoes are present. No pain with calf compression, swelling, warmth, erythema.    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  Status post first, second and third metatarsal cuneiform joint arthrodesis with hardware intact.  Radiolucent line still evident consistent with a nonunion. -She was not able to get the bone stimulator as her insurance was canceled but was reinstated last week.  I will reorder the bone stimulator for her. -Discussed gradual transition back to regular shoe but discussed wearing a stiffer soled shoe and use the graphite insert if needed to help stiffen the shoe as well.  Continue ice, elevate as well as compression to help with any edema.  Should there be any changes to return to the cam boot. -Send her out of work for 3 more weeks.  I will see her back in 2 weeks for repeat x-rays.  *spoke to [place] with [employer] and she going to re-run patients insurance now that it is active. If any issues  will contact the patient.  Trula Slade DPM

## 2022-03-18 ENCOUNTER — Other Ambulatory Visit: Payer: Self-pay | Admitting: Medical-Surgical

## 2022-03-25 ENCOUNTER — Ambulatory Visit (INDEPENDENT_AMBULATORY_CARE_PROVIDER_SITE_OTHER): Payer: Managed Care, Other (non HMO) | Admitting: Medical-Surgical

## 2022-03-25 ENCOUNTER — Encounter: Payer: Self-pay | Admitting: Medical-Surgical

## 2022-03-25 VITALS — BP 160/90 | HR 123 | Ht 67.0 in | Wt 209.0 lb

## 2022-03-25 DIAGNOSIS — E1159 Type 2 diabetes mellitus with other circulatory complications: Secondary | ICD-10-CM | POA: Diagnosis not present

## 2022-03-25 DIAGNOSIS — L732 Hidradenitis suppurativa: Secondary | ICD-10-CM

## 2022-03-25 DIAGNOSIS — G4700 Insomnia, unspecified: Secondary | ICD-10-CM | POA: Diagnosis not present

## 2022-03-25 DIAGNOSIS — I152 Hypertension secondary to endocrine disorders: Secondary | ICD-10-CM

## 2022-03-25 MED ORDER — DOXYCYCLINE HYCLATE 100 MG PO TABS: ORAL_TABLET | ORAL | 0 refills | Status: AC

## 2022-03-25 NOTE — Addendum Note (Signed)
Addended bySamuel Bouche on: 03/25/2022 06:13 PM   Modules accepted: Orders

## 2022-03-25 NOTE — Progress Notes (Signed)
   Established Patient Office Visit  Subjective   Patient ID: Helen Perez, female   DOB: [birth year] Age: 56 y.o. MRN: [id number]   No chief complaint on file.   HPI Pleasant 56 year old female accompanied by her daughter presenting today for further evaluation of the rash in her bilateral axilla that is suspected to be hydradenitis suppurativa.  She has been using topical clindamycin as instructed followed by nystatin powder 3 times daily in the affected areas but unfortunately has not seen much improvement.  She stopped wearing deodorant and tries to avoid excess heat.  She has been increasing the air conditioning in her home and car since this is about the only comfort she gets.  Has noted that she is also now getting a similar type rash in the groin area in the inguinal creases.  Additionally, has noted some dry irritated areas on her bilateral upper arms and back.  Has been taking Diflucan once weekly due to persistent yeast infections.  She has 1 more week on this treatment.   Objective:    Vitals:   03/25/22 1609  BP: (!) 160/90  Pulse: (!) 123  Height: '5\' 7"'$  (1.702 m)  Weight: 209 lb (94.8 kg)  BMI (Calculated): 32.73   Physical Exam Vitals and nursing note reviewed.  Constitutional:      General: She is not in acute distress.    Appearance: Normal appearance. She is not ill-appearing.  HENT:     Head: Normocephalic and atraumatic.  Cardiovascular:     Rate and Rhythm: Normal rate and regular rhythm.  Pulmonary:     Effort: Pulmonary effort is normal. No respiratory distress.  Skin:    General: Skin is warm and dry.     Findings: Rash present.     Comments: Erythematous area to the bilateral axilla with thickening and darkening of the skin centrally.  No scaling or crusting noted.  No fluctuance or obvious abscesses.  Bilateral inner upper arms affected by dry skin with mild irritation.  Neurological:     Mental Status: She is alert and oriented to person, place, and  time.  Psychiatric:        Mood and Affect: Mood normal.        Behavior: Behavior normal.        Thought Content: Thought content normal.        Judgment: Judgment normal.    No results found for this or any previous visit (from the past 24 hour(s)).     The ASCVD Risk score (Arnett DK, et al., 2019) failed to calculate for the following reasons:   The valid total cholesterol range is 130 to 320 mg/dL   Assessment & Plan:   1. Axillary hidradenitis suppurativa Persistent hydradenitis suppurativa symptoms in the setting of uncontrolled diabetes.  Treating with oral doxycycline 100 mg twice daily x7 days then reduce to 100 mg daily.  Referring to dermatology for further evaluation.  Okay to continue using clindamycin topically in the affected areas including the groin.  If desired, okay to use nystatin powder but can discontinue this if preferred.  Continue Diflucan as prescribed to finish the 6-week course.  Return if symptoms worsen or fail to improve. ___________________________________________ Clearnce Sorrel, DNP, APRN, FNP-BC [facility] and [place]

## 2022-03-26 ENCOUNTER — Ambulatory Visit (INDEPENDENT_AMBULATORY_CARE_PROVIDER_SITE_OTHER): Payer: Managed Care, Other (non HMO) | Admitting: Podiatry

## 2022-03-26 ENCOUNTER — Ambulatory Visit (INDEPENDENT_AMBULATORY_CARE_PROVIDER_SITE_OTHER): Payer: Managed Care, Other (non HMO)

## 2022-03-26 ENCOUNTER — Encounter: Payer: Self-pay | Admitting: Podiatry

## 2022-03-26 ENCOUNTER — Ambulatory Visit: Payer: Managed Care, Other (non HMO) | Admitting: Medical-Surgical

## 2022-03-26 DIAGNOSIS — M960 Pseudarthrosis after fusion or arthrodesis: Secondary | ICD-10-CM | POA: Diagnosis not present

## 2022-03-26 DIAGNOSIS — Z9889 Other specified postprocedural states: Secondary | ICD-10-CM | POA: Diagnosis not present

## 2022-03-28 ENCOUNTER — Other Ambulatory Visit: Payer: Self-pay | Admitting: Internal Medicine

## 2022-03-28 NOTE — Progress Notes (Signed)
Subjective: Helen Perez is a 56 y.o. is seen today in office for follow evaluation after undergoing left Lisfranc arthrodesis performed on October 16, 2021.  She relates that she has been doing well.  She is back to her regular shoe.  She was working.  At times if needed.  She has not got a bone stimulator.  I did contact the rep myself after her last appointment I had not heard back that there was any issue.  No fevers or chills.  No other concerns.   Objective: General: No acute distress, AAOx3  DP/PT pulses palpable 2/4, CRT < 3 sec to all digits.  Protective sensation decreased (unchanged) Left foot: Incision is well coapted without any evidence of dehiscence and scars are formed.  Minimal edema.  No erythema or warmth.  No tenderness palpation.  Hammertoes present No pain with calf compression, swelling, warmth, erythema.    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  Status post first, second and third metatarsal cuneiform joint arthrodesis with hardware intact.  Radiolucent line still evident consistent with a nonunion. -We will again follow the bone stimulator. -She is back to regular shoes today.  She is scheduled to return to work on the 23rd.  Working let her go back to work at that time as she cannot sit.  We will write her a note also not to wear steel toed shoes.  She is wearing her shoe with a graphite insert. -Ice, elevation, compression  No follow-ups on file.  Trula Slade DPM

## 2022-03-31 ENCOUNTER — Telehealth: Payer: Self-pay | Admitting: *Deleted

## 2022-03-31 NOTE — Telephone Encounter (Signed)
Patient is requesting a back to work release note w/ no steel toe boots restrictions. Please advise.

## 2022-04-01 ENCOUNTER — Other Ambulatory Visit: Payer: Self-pay | Admitting: Internal Medicine

## 2022-04-01 ENCOUNTER — Encounter: Payer: Self-pay | Admitting: Podiatry

## 2022-04-01 ENCOUNTER — Encounter: Payer: Self-pay | Admitting: Medical-Surgical

## 2022-04-01 ENCOUNTER — Ambulatory Visit (INDEPENDENT_AMBULATORY_CARE_PROVIDER_SITE_OTHER): Payer: Managed Care, Other (non HMO) | Admitting: Medical-Surgical

## 2022-04-01 VITALS — BP 124/67 | HR 125 | Ht 67.0 in | Wt 209.0 lb

## 2022-04-01 DIAGNOSIS — R21 Rash and other nonspecific skin eruption: Secondary | ICD-10-CM

## 2022-04-01 DIAGNOSIS — G43109 Migraine with aura, not intractable, without status migrainosus: Secondary | ICD-10-CM

## 2022-04-01 MED ORDER — HYDROXYZINE HCL 50 MG PO TABS
50.0000 mg | ORAL_TABLET | Freq: Every evening | ORAL | 3 refills | Status: DC | PRN
Start: 2022-04-01 — End: 2022-10-12

## 2022-04-01 MED ORDER — METHYLPREDNISOLONE 4 MG PO TBPK: ORAL_TABLET | ORAL | 0 refills | Status: DC

## 2022-04-01 NOTE — Progress Notes (Signed)
Established Patient Office Visit  Subjective   Patient ID: Helen Perez, female   DOB: 06-07-1966 Age: 56 y.o. MRN: [id number]   No chief complaint on file.   HPI Pleasant 56 year old female presenting today for follow-up on headaches.  Was started on Topamax approximately 6 weeks ago with a slow taper to 50 mg twice daily.  Today reports that she has been taking 50 mg twice daily, tolerating well without side effects.  Notes that her headaches have greatly improved since starting the Topamax.  Is very happy with the medication.  Continues to have significant issues with her skin.  She notes that her axillary irritation seems to be gradually improving with the use of antibiotics however the rash affecting various parts of her trunk, chest, back, arms, and lower extremities continues to be a constant issue.  Notes that at times she feels that bugs are crawling under her skin and the itching is not responsive to Benadryl or topical steroids.  Using hypoallergenic soap and detergent.  No new medications, exposures, foods, cosmetics, or materials.   Objective:    Vitals:   04/01/22 1505  BP: 124/67  Pulse: (!) 125  Height: '5\' 7"'$  (1.702 m)  Weight: 209 lb (94.8 kg)  SpO2: Comment: unable to perform due to length of pt's nails  BMI (Calculated): 32.73   Physical Exam Vitals and nursing note reviewed.  Constitutional:      General: She is not in acute distress.    Appearance: Normal appearance. She is obese. She is not ill-appearing.  HENT:     Head: Normocephalic and atraumatic.  Cardiovascular:     Rate and Rhythm: Normal rate and regular rhythm.     Pulses: Normal pulses.     Heart sounds: Normal heart sounds.  Pulmonary:     Effort: Pulmonary effort is normal. No respiratory distress.     Breath sounds: Normal breath sounds. No wheezing, rhonchi or rales.  Skin:    General: Skin is warm and dry.     Findings: Rash (Scattered, diffuse) present.  Neurological:     Mental  Status: She is alert and oriented to person, place, and time.  Psychiatric:        Mood and Affect: Mood normal.        Behavior: Behavior normal.        Thought Content: Thought content normal.        Judgment: Judgment normal.   No results found for this or any previous visit (from the past 24 hour(s)).     The ASCVD Risk score (Arnett DK, et al., 2019) failed to calculate for the following reasons:   The valid total cholesterol range is 130 to 320 mg/dL   Assessment & Plan:   1. Rash and nonspecific skin eruption Referral to dermatology is already in place.  Contacted our referral coordinator who will be sending this to a different practice so that we can get her in soon as possible.  With the diffuse nature of the rash and the significant pruritus, treating cautiously with a Medrol Dosepak but advised patient to very closely monitor her sugars as this will cause a spike.  Also sending in hydroxyzine 50 mg 3 times daily as needed for itching.  Advised to avoid taking this with the Benadryl.  Continue using Aquaphor or Eucerin to keep skin moisturized and prevent excoriation from scratching.  2. Migraine with aura and without status migrainosus, not intractable Symptoms well controlled on current regimen.  Continue  Topamax 50 mg twice daily.  Return in about 3 months (around 07/02/2022) for DM follow up. ___________________________________________ Clearnce Sorrel, DNP, APRN, FNP-BC [facility] and [place]

## 2022-04-01 NOTE — Telephone Encounter (Signed)
Patient called and stated that She needed her work note update to start back work on 06/19 until 06/23 due to her short term disability running out.   Patient will be by to pick up nite today after 1pm

## 2022-04-08 ENCOUNTER — Encounter: Payer: Self-pay | Admitting: Medical-Surgical

## 2022-04-26 ENCOUNTER — Other Ambulatory Visit: Payer: Self-pay | Admitting: Medical-Surgical

## 2022-05-08 ENCOUNTER — Other Ambulatory Visit: Payer: Self-pay | Admitting: Medical-Surgical

## 2022-05-10 NOTE — Telephone Encounter (Signed)
Last written 02/26/2022 #30 with 1 refill Last appt 04/01/2022

## 2022-05-11 ENCOUNTER — Ambulatory Visit (INDEPENDENT_AMBULATORY_CARE_PROVIDER_SITE_OTHER): Payer: Managed Care, Other (non HMO) | Admitting: Podiatry

## 2022-05-11 ENCOUNTER — Ambulatory Visit (INDEPENDENT_AMBULATORY_CARE_PROVIDER_SITE_OTHER): Payer: Managed Care, Other (non HMO)

## 2022-05-11 DIAGNOSIS — Z9889 Other specified postprocedural states: Secondary | ICD-10-CM

## 2022-05-11 DIAGNOSIS — M2042 Other hammer toe(s) (acquired), left foot: Secondary | ICD-10-CM | POA: Diagnosis not present

## 2022-05-11 NOTE — Progress Notes (Signed)
Subjective: 56 year old female presents the office today for concerns of discoloration to her left third toe.  Also symptoms is not able to move the toes much.  She states that she is concerned that the toe is "dying".  No recent injury.  She is back to work from the surgery from a surgical standpoint she said that she is doing very well.  Denies any fevers or chills.  No open lesions that she reports.  Objective: General: No acute distress, AAOx3  DP/PT pulses palpable 2/4, CRT < 3 sec to all digits including the toe in question. Protective sensation decreased  Left foot: Incision is well coapted without any evidence of dehiscence and scars are formed.  Slight edema to the surgical site.  There is no pain along the surgical site.  Hammertoe deformities present.  There are some mild swelling of the third toe.  Motor function intact in the digits.  There is no significant pain upon palpation today No pain with calf compression, swelling, warmth, erythema.    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  3 views of the left foot were obtained.  No subacute fracture.  Hammertoe deformities present.  Increase consolidation noted across the arthrodesis site.  Hardware intact. -There is decreased range of motion of the toe but this is more on the hammertoe.  There is good capillary refill time and normal temperature of the digit.  Upon further discussion with her she was concerned about the toe "dying" and she cannot move it.  I did note some hammertoe.  Also discussed hammertoe repair at some point.  She not able to take time off work for now.  Discussed with shoes and offloading pads.  Trula Slade DPM

## 2022-06-06 ENCOUNTER — Other Ambulatory Visit: Payer: Self-pay | Admitting: Medical-Surgical

## 2022-06-10 NOTE — Telephone Encounter (Signed)
Called patient to schedule appointment and she wanted to know when she could be worked in with [employer]? Is this okay to use a same day slot for? I offered patient appt with Dr Mel Almond and she wanted to see Joy. I offered a Monday appt (the virtual slot) with Joy and it did not work for her.

## 2022-06-10 NOTE — Telephone Encounter (Signed)
Patient needs an in office appointment.  Joy can not send this medication with out the patient being tested for a yeast infection.

## 2022-06-11 NOTE — Telephone Encounter (Signed)
Called patient and patient stated she is out of town  - she was added to the nurse schedule Wednesday 06/16/22 per her request.

## 2022-06-15 ENCOUNTER — Telehealth: Payer: Self-pay | Admitting: Podiatry

## 2022-06-15 ENCOUNTER — Ambulatory Visit: Payer: Managed Care, Other (non HMO) | Admitting: Podiatry

## 2022-06-15 NOTE — Telephone Encounter (Signed)
Called pt back and got her in to see Dr Jacqualyn Posey tomorrow 8.30 at 300pm ok per Estill Bamberg.Helen KitchenMarland Perez

## 2022-06-15 NOTE — Telephone Encounter (Signed)
Pt called and stated she missed her 145pm appt with Dr Jacqualyn Posey. His next available is not until October and I did offer her to see a different provider but she stated she does not think he would like that as he did the surgery on her foot. She stated her toes are messed up and puss is coming out. At that time I transferred to the nurse.

## 2022-06-16 ENCOUNTER — Ambulatory Visit: Payer: Managed Care, Other (non HMO)

## 2022-06-16 ENCOUNTER — Ambulatory Visit (INDEPENDENT_AMBULATORY_CARE_PROVIDER_SITE_OTHER): Payer: Managed Care, Other (non HMO)

## 2022-06-16 ENCOUNTER — Encounter: Payer: Self-pay | Admitting: Podiatry

## 2022-06-16 ENCOUNTER — Ambulatory Visit (INDEPENDENT_AMBULATORY_CARE_PROVIDER_SITE_OTHER): Payer: Managed Care, Other (non HMO) | Admitting: Podiatry

## 2022-06-16 DIAGNOSIS — L03032 Cellulitis of left toe: Secondary | ICD-10-CM | POA: Diagnosis not present

## 2022-06-16 DIAGNOSIS — L02612 Cutaneous abscess of left foot: Secondary | ICD-10-CM | POA: Diagnosis not present

## 2022-06-16 DIAGNOSIS — T1490XA Injury, unspecified, initial encounter: Secondary | ICD-10-CM

## 2022-06-16 DIAGNOSIS — M2042 Other hammer toe(s) (acquired), left foot: Secondary | ICD-10-CM | POA: Diagnosis not present

## 2022-06-16 DIAGNOSIS — L97522 Non-pressure chronic ulcer of other part of left foot with fat layer exposed: Secondary | ICD-10-CM | POA: Diagnosis not present

## 2022-06-16 MED ORDER — CLOTRIMAZOLE-BETAMETHASONE 1-0.05 % EX CREA: 1.0000 | TOPICAL_CREAM | Freq: Two times a day (BID) | CUTANEOUS | 0 refills | Status: DC

## 2022-06-16 MED ORDER — CEPHALEXIN 500 MG PO CAPS: 500.0000 mg | ORAL_CAPSULE | Freq: Three times a day (TID) | ORAL | 0 refills | Status: DC

## 2022-06-16 MED ORDER — MUPIROCIN 2 % EX OINT: 1.0000 | TOPICAL_OINTMENT | Freq: Two times a day (BID) | CUTANEOUS | 2 refills | Status: DC

## 2022-06-17 ENCOUNTER — Other Ambulatory Visit: Payer: Self-pay | Admitting: Podiatry

## 2022-06-17 ENCOUNTER — Ambulatory Visit (INDEPENDENT_AMBULATORY_CARE_PROVIDER_SITE_OTHER): Payer: Managed Care, Other (non HMO) | Admitting: Medical-Surgical

## 2022-06-17 ENCOUNTER — Encounter: Payer: Self-pay | Admitting: Medical-Surgical

## 2022-06-17 DIAGNOSIS — N761 Subacute and chronic vaginitis: Secondary | ICD-10-CM | POA: Diagnosis not present

## 2022-06-17 DIAGNOSIS — Z23 Encounter for immunization: Secondary | ICD-10-CM | POA: Diagnosis not present

## 2022-06-17 DIAGNOSIS — R400 Somnolence: Secondary | ICD-10-CM

## 2022-06-17 DIAGNOSIS — L732 Hidradenitis suppurativa: Secondary | ICD-10-CM | POA: Diagnosis not present

## 2022-06-17 DIAGNOSIS — B3731 Acute candidiasis of vulva and vagina: Secondary | ICD-10-CM

## 2022-06-17 DIAGNOSIS — L02612 Cutaneous abscess of left foot: Secondary | ICD-10-CM

## 2022-06-17 DIAGNOSIS — R0683 Snoring: Secondary | ICD-10-CM | POA: Diagnosis not present

## 2022-06-17 DIAGNOSIS — G479 Sleep disorder, unspecified: Secondary | ICD-10-CM

## 2022-06-17 LAB — WET PREP FOR TRICH, YEAST, CLUE
MICRO NUMBER:: 13858377
Specimen Quality: ADEQUATE

## 2022-06-17 MED ORDER — METRONIDAZOLE 500 MG PO TABS: 500.0000 mg | ORAL_TABLET | Freq: Two times a day (BID) | ORAL | 0 refills | Status: AC

## 2022-06-17 MED ORDER — CLINDAMYCIN PHOSPHATE 1 % EX SWAB: 1.0000 | Freq: Two times a day (BID) | CUTANEOUS | 1 refills | Status: DC

## 2022-06-17 NOTE — Progress Notes (Addendum)
Established Patient Office Visit  Subjective   Patient ID: Helen Perez, female   DOB: Apr 03, 1966 Age: 56 y.o. MRN: [id number]   Chief Complaint  Patient presents with   Vaginitis   HPI Pleasant 56 year old female presenting today for the following:  Currently under the care of podiatry with Dr. Earleen Newport.  Notes that she has a toe infection that has developed since last time I saw her.  He recently placed her on Keflex for this and she reports that he will be looking to do a procedure soon to address the hammertoe that is causing the problem.  Also notes that she was found to have a fungal infection on her feet that he is also treating.  Continues to have issues with sleeping for both onset and maintenance.  Her family member has told her that she is able to hear her through the wall of the bedroom at night with loud snoring, yelling out, and she has been witnessed to stop breathing.  We did do a referral for her for a sleep study however this referral was closed.  She would like to pursue getting a sleep study done in hopes that it will help with her sleep issues.     Vaginal symptoms-continues to have significant vaginal symptoms.  She has done prophylactic dosing for yeast with Diflucan however she does continue to have internal vaginal itching as well as a bad fishy odor at times.  Does not currently see OB/GYN but is open to the option to discuss longer-term management/prevention for her symptoms.  She is aware that maintaining good control over her diabetes is very important to help prevent recurrent vaginal issues.  Continues to have axillary and groin abscesses that come and go.  Currently has 1 in the left axilla as well as in the left groin.  The left axillary abscess is not painful, tender, or draining.  The groin abscess is along the underwear line and she notes that she has seen some purulent type drainage from that periodically.  She did not get contacted by dermatology and on  further evaluation that referral was sent to [place] [facility] who has now closed their practice.  She would like to have a new referral sent to discuss long-term treatment options for hidradenitis suppurativa.  Objective:    Vitals:   06/17/22 1642  BP: (!) 142/85  Pulse: (!) 110  Temp: 98.2 F (36.8 C)  Resp: 20  Height: '5\' 7"'$  (1.702 m)  Weight: 211 lb 1.3 oz (95.7 kg)  SpO2: 99%  BMI (Calculated): 33.05   Physical Exam Vitals and nursing note reviewed.  Constitutional:      General: She is not in acute distress.    Appearance: Normal appearance. She is obese. She is not ill-appearing.  HENT:     Head: Normocephalic and atraumatic.  Cardiovascular:     Rate and Rhythm: Normal rate and regular rhythm.     Pulses: Normal pulses.     Heart sounds: Normal heart sounds.  Pulmonary:     Effort: Pulmonary effort is normal. No respiratory distress.     Breath sounds: Normal breath sounds. No wheezing, rhonchi or rales.  Skin:    General: Skin is warm and dry.     Comments: 3cm x 2cm indurated lesion in the left axilla without fluctuance, tenderness, erythema, or drainage. Declined groin abscess evaluation.   Neurological:     Mental Status: She is alert and oriented to person, place, and time.  Psychiatric:  Mood and Affect: Mood normal.        Behavior: Behavior normal.        Thought Content: Thought content normal.        Judgment: Judgment normal.   No results found for this or any previous visit (from the past 24 hour(s)).   The ASCVD Risk score (Arnett DK, et al., 2019) failed to calculate for the following reasons:   The valid total cholesterol range is 130 to 320 mg/dL   Assessment & Plan:   1. Chronic vaginitis Wet prep + for BV. Treating with oral Metronidazole. Referring to OBGYN for further discussion of chronic vaginal irritation and recommendations for long term management and prevention.  - WET PREP FOR [place], YEAST, CLUE - Ambulatory referral to  Obstetrics / Gynecology - metroNIDAZOLE (FLAGYL) 500 MG tablet; Take 1 tablet (500 mg total) by mouth 2 (two) times daily for 7 days.  Dispense: 14 tablet; Refill: 0  2. Axillary hidradenitis suppurativa New referral for dermatology placed. Adding topical clindamycin BID to the axilla and groin. Abscess in the axilla without fluctuance so no indication for I&D today. Groin abscess already draining. Advised to continue with Keflex that she is starting for her toe as this may help resolve her groin lesion. If not improving with Keflex, may need to switch to Doxycycline.  - clindamycin (CLEOCIN T) 1 % SWAB; Apply 1 Application topically 2 (two) times daily. Apply to the affected areas in the axilla and groin.  Dispense: 180 each; Refill: 1 - Ambulatory referral to Dermatology  3. Need for shingles vaccine Shingrix #1 given in office today.  - Varicella-zoster vaccine IM  4. Need for influenza vaccination Flu vaccine given in office today.  - Flu Vaccine QUAD 6+ mos PF IM (Fluarix Quad PF)  5. Loud snoring 6. Daytime somnolence 7. Sleep disturbance New referral entered for sleep study. [place] places her in the high risk category so feel that OSA may be a contributor to her mixed sleep disturbance issues.  - Ambulatory referral to Sleep Studies  Return if symptoms worsen or fail to improve. ___________________________________________ Clearnce Sorrel, DNP, APRN, FNP-BC [facility] and [place]

## 2022-06-18 NOTE — Addendum Note (Signed)
Addended bySamuel Bouche on: 06/18/2022 05:25 PM   Modules accepted: Orders

## 2022-06-21 NOTE — Progress Notes (Signed)
Subjective: Chief Complaint  Patient presents with   Toe Pain    Patient states that she has pus coming out of toes. Patient states this is ongoing since her last visit     56 year old female presents the office today for drainage coming from her toe.  She states that this started after her last visit with me.  She began pedicures and keep antibiotic ointment on the toe.  Mild swelling to the toe.  No injuries that she reports.  No fever or chills.  Objective: General: No acute distress, AAOx3  DP/PT pulses palpable 2/4, CRT < 3 sec to all digits including the toe in question. Protective sensation decreased  Left foot: Incision is well coapted without any evidence of dehiscence and scars are formed.  No significant edema to the midfoot.  There is localized edema present the second toe and there is a superficial granular wound to the distal portion of toe with some macerated periwound.  There is no probing to bone, and or tunneling.  There is no fluctuation or crepitation.  There is no malodor. No pain with calf compression, swelling, warmth, erythema.   Assessment and Plan:  Left second toe ulceration  -Treatment options discussed including all alternatives, risks, and complications -X-rays obtained reviewed.  3 views of the left foot were obtained.  No subacute fracture.  Hammertoe deformities present.  Increase consolidation noted across the arthrodesis site.  Hardware intact.  Hammertoes are present.  No evidence of cortical destruction suggestive of osteomyelitis at this time. -I debrided some of the tissue on the periphery of the wound to any complications or bleeding.  Recommended to hold off on pedicures for now.  Recommended Bactroban ointment which I prescribed today as well as oral antibiotics and prescribed Keflex.  Offloading at all times.  Unfortunate given the hammertoe and the neuropathy this is likely causing the ulceration.  She is monitor closely for any signs or symptoms of  worsening infection report to emergency room should any occur.  Trula Slade DPM

## 2022-06-28 ENCOUNTER — Ambulatory Visit (INDEPENDENT_AMBULATORY_CARE_PROVIDER_SITE_OTHER): Payer: Managed Care, Other (non HMO) | Admitting: Podiatry

## 2022-06-28 ENCOUNTER — Encounter: Payer: Self-pay | Admitting: Podiatry

## 2022-06-28 DIAGNOSIS — L97522 Non-pressure chronic ulcer of other part of left foot with fat layer exposed: Secondary | ICD-10-CM

## 2022-06-28 DIAGNOSIS — M2042 Other hammer toe(s) (acquired), left foot: Secondary | ICD-10-CM | POA: Diagnosis not present

## 2022-07-01 ENCOUNTER — Other Ambulatory Visit: Payer: Self-pay | Admitting: Medical-Surgical

## 2022-07-02 ENCOUNTER — Other Ambulatory Visit (HOSPITAL_COMMUNITY): Payer: Self-pay

## 2022-07-02 ENCOUNTER — Telehealth: Payer: Self-pay

## 2022-07-02 NOTE — Telephone Encounter (Signed)
Patient Advocate Encounter   Received notification that prior authorization is required for Ozempic (2 MG/DOSE) '8MG'$ /3ML pen-injectors  Submitted: 07-02-2022 Key [id number]  Status is pending

## 2022-07-05 ENCOUNTER — Other Ambulatory Visit (HOSPITAL_COMMUNITY): Payer: Self-pay

## 2022-07-05 NOTE — Progress Notes (Signed)
Subjective: No chief complaint on file.   56 year old female presents the office today for follow-up evaluation of a wound to the left second toe.  She denies any drainage.  No increase in swelling or pain.  No fevers or chills.  No other concerns.  Objective: General: No acute distress, AAOx3  DP/PT pulses palpable 2/4, CRT < 3 sec to all digits including the toe in question. Protective sensation decreased  Left foot: Incision is well coapted without any evidence of dehiscence and scars are formed.  Tip of the left second toe is hyperkeratotic lesion.  The area is preulcerative there is no definitive skin breakdown.  There is mild swelling but there is no redness or warmth.  There is no drainage or pus.  No fluctuance or crepitation. No pain with calf compression, swelling, warmth, erythema.   Assessment and Plan:  Left second toe ulceration  -Treatment options discussed including all alternatives, risks, and complications -Sharply debrided hyperkeratotic tissue that was present distal aspect left second toe.  Appears that the wound is doing much better.  The symmetry any signs or symptoms of reoccurrence or infection.  We discussed a flexor tenotomy today but since she is doing better with them continue with conservative care.  However symptoms continue to worsen we will reconsider this.   Trula Slade DPM

## 2022-07-05 NOTE — Telephone Encounter (Signed)
Patient Advocate Encounter  Prior Authorization for Ozempic (2 MG/DOSE) '8MG'$ /3ML pen-injectors has been approved.     Effective: 07-05-2022 to 07-06-2023

## 2022-07-21 ENCOUNTER — Other Ambulatory Visit: Payer: Self-pay | Admitting: Medical-Surgical

## 2022-07-21 MED ORDER — VALACYCLOVIR HCL 500 MG PO TABS: 500.0000 mg | ORAL_TABLET | Freq: Every day | ORAL | 0 refills | Status: DC

## 2022-07-21 MED ORDER — ATORVASTATIN CALCIUM 40 MG PO TABS: 40.0000 mg | ORAL_TABLET | Freq: Every day | ORAL | 0 refills | Status: DC

## 2022-08-01 ENCOUNTER — Other Ambulatory Visit: Payer: Self-pay | Admitting: Internal Medicine

## 2022-08-02 ENCOUNTER — Ambulatory Visit (INDEPENDENT_AMBULATORY_CARE_PROVIDER_SITE_OTHER): Payer: Managed Care, Other (non HMO) | Admitting: Podiatry

## 2022-08-02 ENCOUNTER — Ambulatory Visit (INDEPENDENT_AMBULATORY_CARE_PROVIDER_SITE_OTHER): Payer: Managed Care, Other (non HMO)

## 2022-08-02 DIAGNOSIS — M2042 Other hammer toe(s) (acquired), left foot: Secondary | ICD-10-CM

## 2022-08-02 DIAGNOSIS — L97522 Non-pressure chronic ulcer of other part of left foot with fat layer exposed: Secondary | ICD-10-CM

## 2022-08-03 ENCOUNTER — Encounter: Payer: Self-pay | Admitting: Neurology

## 2022-08-03 ENCOUNTER — Ambulatory Visit (INDEPENDENT_AMBULATORY_CARE_PROVIDER_SITE_OTHER): Payer: Managed Care, Other (non HMO) | Admitting: Neurology

## 2022-08-03 VITALS — BP 144/91 | HR 113 | Ht 67.0 in | Wt 208.0 lb

## 2022-08-03 DIAGNOSIS — G4733 Obstructive sleep apnea (adult) (pediatric): Secondary | ICD-10-CM

## 2022-08-03 DIAGNOSIS — G4719 Other hypersomnia: Secondary | ICD-10-CM | POA: Diagnosis not present

## 2022-08-03 DIAGNOSIS — R400 Somnolence: Secondary | ICD-10-CM

## 2022-08-03 DIAGNOSIS — E669 Obesity, unspecified: Secondary | ICD-10-CM

## 2022-08-03 DIAGNOSIS — Z820 Family history of epilepsy and other diseases of the nervous system: Secondary | ICD-10-CM

## 2022-08-03 DIAGNOSIS — R519 Headache, unspecified: Secondary | ICD-10-CM

## 2022-08-03 DIAGNOSIS — R0683 Snoring: Secondary | ICD-10-CM

## 2022-08-03 DIAGNOSIS — Z789 Other specified health status: Secondary | ICD-10-CM

## 2022-08-03 NOTE — Patient Instructions (Signed)

## 2022-08-03 NOTE — Progress Notes (Signed)
Subjective:    Patient ID: Helen Perez is a 56 y.o. female.  HPI    Star Age, MD, PhD [facility] [address], [address] [place], [place] [address]   Dear Caryl Asp,  I saw your patient, Helen Perez, upon your kind request in my sleep clinic today for initial consultation of her sleep disorder, in particular, evaluation of her prior diagnosis of obstructive sleep apnea.  The patient is unaccompanied today.  As you know, [name] is a 56 year old right-handed woman with an underlying medical history of hidradenitis suppurativa, diabetes, hypertension, and obesity, who reports snoring and excessive daytime somnolence, as well as witnessed apneas per daughter's feedback.  I reviewed your office note from 06/17/2022.  She had seen Thomos Lemons, [place] at [facility] in July 2018 for sleep evaluation but did not have any testing at the time.  She reports a sleep study at home some 3+ years ago, maybe as long as 5 years ago.  She has a CPAP machine but uses it for maybe a year, has not used it for maybe 4 years now, believes that the machine is in storage.  She had trouble with her masks, she used to break out in a rash, reports a latex allergy.  Her Epworth sleepiness score is 22 out of 24, fatigue severity score is 63 out of 63.  She reports that her father had sleep apnea, he passed away.  She reports feeling sleepy at the wheel and sleepy at work, she has fallen asleep at work.  She works as an Loss adjuster, chartered, denies using any heavy machinery or dangerous equipment.  She is strongly advised not to drive when feeling sleepy.  She is a second shift Insurance underwriter.  Bedtime is generally around 4:30 AM and rise time around 11 AM.  She does not have night to night nocturia but has occasionally woken up with a headache.  She does not typically take any medication for headaches.  She would be willing to get retested for sleep apnea and consider CPAP therapy again.  She has had  weight fluctuation.  She takes Ambien CR generic before sleep.  She lives with her mom, they have no pets in household.  She does have a TV on before she falls asleep, she has it on a 1 hour sleep timer typically.  She quit smoking some 12 years ago.  She drinks no daily caffeine, but does drink soda or tea or coffee.  She drinks alcohol less than once a month.  She is working on weight loss.  She has a history of tachycardia.  She denies any palpitations.  Her Past Medical History Is Significant For: Past Medical History:  Diagnosis Date   Anxiety    Depression    Diabetes mellitus without complication ([place])    H/O syphilis    HSV-2 infection    Hypertension associated with diabetes ([place]) 03/30/2018   Renal insufficiency 10/13/2018   Sleep apnea    CPAP   Stroke [facility])     Her Past Surgical History Is Significant For: Past Surgical History:  Procedure Laterality Date   ABDOMINAL HYSTERECTOMY     FOOT SURGERY Right     Her Family History Is Significant For: Family History  Problem Relation Age of Onset   Diabetes Mother    Heart disease Mother    Heart failure Mother    Kidney failure Father    Diabetes Sister    Kidney failure Sister  Sleep apnea Sister    Other Brother        quadraplegia   Diabetes Brother    Kidney failure Brother    Sleep apnea Brother    Diabetes Brother    Diabetes Maternal Grandmother    Colon cancer Neg Hx    Esophageal cancer Neg Hx    Pancreatic cancer Neg Hx    Stomach cancer Neg Hx    Liver disease Neg Hx     Her Social History Is Significant For: Social History   Socioeconomic History   Marital status: Single    Spouse name: Not on file   Number of children: 1   Years of education: Not on file   Highest education level: Not on file  Occupational History   Not on file  Tobacco Use   Smoking status: Former    Types: Cigarettes    Quit date: 07/27/2012    Years since quitting: 10.0   Smokeless tobacco: Never  Vaping Use    Vaping Use: Never used  Substance and Sexual Activity   Alcohol use: Yes    Comment: 2-3 drinks/month, wine liquor   Drug use: No   Sexual activity: Yes    Partners: Male    Birth control/protection: Surgical  Other Topics Concern   Not on file  Social History Narrative   Not on file   Social Determinants of Health   Financial Resource Strain: Not on file  Food Insecurity: Not on file  Transportation Needs: Not on file  Physical Activity: Not on file  Stress: Not on file  Social Connections: Not on file    Her Allergies Are:  Allergies  Allergen Reactions   Adhesive [Tape]     From dexcom   Latex Itching and Swelling   Other Dermatitis    FREESTYLE LIBRE SENSOR- cellulitis, wound on skin   :   Her Current Medications Are:  Outpatient Encounter Medications as of 08/03/2022  Medication Sig   acetaminophen (TYLENOL) 650 MG CR tablet Take 1 tablet by mouth as needed.   acyclovir ointment (ZOVIRAX) 5 % Apply topically.   amitriptyline (ELAVIL) 25 MG tablet TAKE 1 TABLET BY MOUTH EVERY NIGHT AT BEDTIME   aspirin EC 81 MG tablet Take 1 tablet by mouth daily.   atorvastatin (LIPITOR) 40 MG tablet Take 1 tablet (40 mg total) by mouth daily.   buPROPion 450 MG TB24 Take 450 mg by mouth daily.   cephALEXin (KEFLEX) 500 MG capsule Take 1 capsule (500 mg total) by mouth 3 (three) times daily.   clindamycin (CLEOCIN T) 1 % SWAB Apply 1 Application topically 2 (two) times daily. Apply to the affected areas in the axilla and groin.   clotrimazole-betamethasone (LOTRISONE) cream Apply 1 Application topically 2 (two) times daily.   Continuous Blood Gluc Receiver (DEXCOM G6 RECEIVER) DEVI USE AS INSTRUCTED TO CHECK BLOOD SUGAR DAILY   Continuous Blood Gluc Sensor (DEXCOM G6 SENSOR) MISC INSERT 1 DEVICE INTO THE SKIN EVERY 10 DAYS   Continuous Blood Gluc Transmit (DEXCOM G6 TRANSMITTER) MISC USE 1 EVERY 3 MONTHS AS DIRECTED   fexofenadine-pseudoephedrine (ALLEGRA-D 24) 180-240 MG 24 hr  tablet Take 1 tablet by mouth daily as needed.   fluconazole (DIFLUCAN) 150 MG tablet Take 1 tablet (150 mg total) by mouth once a week.   furosemide (LASIX) 20 MG tablet TAKE 1 TABLET BY MOUTH DAILY. IF SIGNIFICANT SWELLING THAT IS NOT RESPONDING TO ELEVATION AND COMPRESSION, TAKE 1 EXTRA TABLET BY MOUTH NO MORE  THAN 3 TIMES WEEKLY.   HYDROcodone-acetaminophen (NORCO/VICODIN) 5-325 MG tablet Take 1-2 tablets by mouth every 6 (six) hours as needed.   hydrOXYzine (ATARAX) 50 MG tablet Take 1 tablet (50 mg total) by mouth at bedtime and may repeat dose one time if needed. For itching.   injection device for insulin (INPEN 100-BLUE-NOVOLOG-FIASP) DEVI Max 80 units daily   insulin aspart (NOVOLOG PENFILL) cartridge MAX DAILY DOSE 80 UNITS   Insulin Pen Needle 29G X 5MM MISC 1 Device by Does not apply route as directed.   lisinopril (ZESTRIL) 5 MG tablet Take 1 tablet by mouth daily.   meloxicam (MOBIC) 15 MG tablet Take 15 mg by mouth daily.   methylPREDNISolone (MEDROL DOSEPAK) 4 MG TBPK tablet 6-day pack as directed   mupirocin ointment (BACTROBAN) 2 % Apply 1 application topically. 2-3 times daily   mupirocin ointment (BACTROBAN) 2 % Apply 1 Application topically 2 (two) times daily.   NONFORMULARY OR COMPOUNDED ITEM Antifungal solution: Terbinafine 3%, Fluconazole 2%, Tea Tree Oil 5%, Urea 10%, Ibuprofen 2% in DMSO suspension #79m   Nystatin POWD Apply 1 application. topically 3 (three) times daily as needed.   omeprazole (PRILOSEC) 20 MG capsule Take 1 capsule by mouth daily.   Ostomy Supplies (SKIN TAC ADHESIVE BARRIER WIPE) MISC 1 applicator by Does not apply route as directed.   oxyCODONE-acetaminophen (PERCOCET/ROXICET) 5-325 MG tablet Take 1-2 tablets by mouth every 6 (six) hours as needed for severe pain.   OZEMPIC, 2 MG/DOSE, 8 MG/3ML SOPN INJECT 2 MG INTO THE SKIN ONCE A WEEK   promethazine (PHENERGAN) 25 MG tablet Take 1 tablet (25 mg total) by mouth every 8 (eight) hours as needed for  nausea or vomiting.   senna-docusate (SENOKOT-S) 8.6-50 MG tablet Take by mouth.   TOUJEO MAX SOLOSTAR 300 UNIT/ML Solostar Pen INJECT 34 UNITS INTO THE SKIN DAILY   triamcinolone (NASACORT) 55 MCG/ACT AERO nasal inhaler Place 2 sprays into the nose daily.   triamcinolone cream (KENALOG) 0.5 % Apply 1 application topically 2 (two) times daily. To affected areas.   TRULANCE 3 MG TABS TAKE 1 TABLET BY MOUTH DAILY AS NEEDED   valACYclovir (VALTREX) 500 MG tablet Take 1 tablet (500 mg total) by mouth daily.   Vitamin D, Ergocalciferol, (DRISDOL) 1.25 MG (50000 UNIT) CAPS capsule TAKE 1 CAPSULE BY MOUTH EVERY 7 DAYS   zolpidem (AMBIEN CR) 12.5 MG CR tablet TAKE ONE TABLET BY MOUTH EVERY NIGHT AT BEDTIME AS NEEDED FOR SLEEP   topiramate (TOPAMAX) 25 MG tablet Take 1 tablet (25 mg total) by mouth daily for 7 days, THEN 1 tablet (25 mg total) 2 (two) times daily for 7 days, THEN 2 tablets (50 mg total) 2 (two) times daily for 16 days.   No facility-administered encounter medications on file as of 08/03/2022.  :   Review of Systems:  Out of a complete 14 point review of systems, all are reviewed and negative with the exception of these symptoms as listed below:   Review of Systems  Neurological:        Pt here for sleep consult Pt snores,fatigue,headaches Pt denies hypertension,sleep study ,CPAP machine      ESS: FSS:    Objective:  Neurological Exam  Physical Exam Physical Examination:   Vitals:   08/03/22 1041  BP: (!) 144/91  Pulse: (!) 113    General Examination: The patient is a very pleasant 56y.o. female in no acute distress. She appears well-developed and well-nourished and well groomed.  HEENT: Normocephalic, atraumatic, pupils are equal, round and reactive to light, extraocular tracking is good without limitation to gaze excursion or nystagmus noted. Hearing is grossly intact. Face is symmetric with normal facial animation. Speech is clear with no dysarthria noted.  There is no hypophonia. There is no lip, neck/head, jaw or voice tremor. Neck is supple with full range of passive and active motion. There are no carotid bruits on auscultation. Oropharynx exam reveals: mild mouth dryness, adequate dental hygiene and moderate airway crowding, due to smaller airway entry, uvula and tonsils not fully visualized, Mallampati class IV, wider tongue.  Neck circumference 15-3/8 inches.  She has a mild overbite.  Tongue protrudes centrally.  Chest: Clear to auscultation without wheezing, rhonchi or crackles noted.  Heart: S1+S2+0, regular and normal without murmurs, rubs or gallops noted.   Abdomen: Soft, non-tender and non-distended.  Extremities: There is no obvious edema in the distal lower extremities bilaterally.   Skin: Warm and dry without trophic changes noted.   Musculoskeletal: exam reveals no obvious joint deformities.   Neurologically:  Mental status: The patient is awake, alert and oriented in all 4 spheres. Her immediate and remote memory, attention, language skills and fund of knowledge are appropriate. There is no evidence of aphasia, agnosia, apraxia or anomia. Speech is clear with normal prosody and enunciation. Thought process is linear. Mood is normal and affect is normal.  Cranial nerves II - XII are as described above under HEENT exam.  Motor exam: Normal bulk, strength and tone is noted. There is no obvious action or resting tremor.  Fine motor skills and coordination: grossly intact.  Cerebellar testing: No dysmetria or intention tremor. There is no truncal or gait ataxia.  Sensory exam: intact to light touch in the upper and lower extremities.   Assessment and Plan:  In summary, Helen Perez is a very pleasant 56 y.o.-year old female with an underlying medical history of hidradenitis suppurativa, diabetes, hypertension, and obesity, who presents for evaluation of her sleep apnea.  She was diagnosed some 3 to 5 years ago and had a CPAP  machine or AutoPap machine but has not used her machine in about 4 years as per her recollection. A laboratory attended sleep study is considered gold standard for evaluation of sleep disordered breathing and is recommended at this time and clinically justified.   I had a long chat with the patient about my findings and the diagnosis of sleep apnea, particularly OSA, its prognosis and treatment options. We talked about medical/conservative treatments, surgical interventions and non-pharmacological approaches for symptom control. I explained, in particular, the risks and ramifications of untreated moderate to severe OSA, especially with respect to developing cardiovascular disease down the road, including congestive heart failure (CHF), difficult to treat hypertension, cardiac arrhythmias (particularly A-fib), neurovascular complications including TIA, stroke and dementia. Even type 2 diabetes has, in part, been linked to untreated OSA. Symptoms of untreated OSA may include (but may not be limited to) daytime sleepiness, nocturia (i.e. frequent nighttime urination), memory problems, mood irritability and suboptimally controlled or worsening mood disorder such as depression and/or anxiety, lack of energy, lack of motivation, physical discomfort, as well as recurrent headaches, especially morning or nocturnal headaches. We talked about the importance of maintaining a healthy lifestyle and striving for healthy weight. In addition, we talked about the importance of striving for and maintaining good sleep hygiene.  She indicates significant sleepiness and has fallen asleep at work.  She is strongly advised not to drive when feeling  this sleepy.  Furthermore, for sleep testing, she is reminded that she would have to trim one of her nails and remove nail polish for accuracy of the pulse oximeter sensor. I recommended the following at this time: sleep study.  I outlined the differences between a laboratory attended sleep  study which is considered more comprehensive and accurate over the option of a home sleep test (HST); the latter may lead to underestimation of sleep disordered breathing in some instances and does not help with diagnosing upper airway resistance syndrome and is not accurate enough to diagnose primary central sleep apnea typically. I explained the different sleep test procedures to the patient in detail and also outlined possible surgical and non-surgical treatment options of OSA, including the use of a pressure airway pressure (PAP) device (ie CPAP, AutoPAP/APAP or BiPAP in certain circumstances), a custom-made dental device (aka oral appliance, which would require a referral to a specialist dentist or orthodontist typically, and is generally speaking not considered a good choice for patients with full dentures or edentulous state), upper airway surgical options, such as traditional UPPP (which is not considered a first-line treatment) or the Inspire device (hypoglossal nerve stimulator, which would involve a referral for consultation with an ENT surgeon, after careful selection, following inclusion criteria). I explained the PAP treatment option to the patient in detail, as this is generally considered first-line treatment.  The patient indicated that she would be willing to try PAP therapy, if the need arises. I explained the importance of being compliant with PAP treatment, not only for insurance purposes but primarily to improve patient's symptoms symptoms, and for the patient's long term health benefit, including to reduce Her cardiovascular risks longer-term.    We will pick up our discussion about the next steps and treatment options after testing.  We will keep her posted as to the test results by phone call and/or MyChart messaging where possible.  We will plan to follow-up in sleep clinic accordingly as well.  I answered all her questions today and the patient was in agreement.   I encouraged her to  call with any interim questions, concerns, problems or updates or email [place] through [place].  Generally speaking, sleep test authorizations may take up to 2 weeks, sometimes less, sometimes longer, the patient is encouraged to get in touch with [place] if they do not hear back from the sleep lab staff directly within the next 2 weeks.  Thank you very much for allowing me to participate in the care of this nice patient. If I can be of any further assistance to you please do not hesitate to call me at [phone].  Sincerely,   Star Age, MD, PhD

## 2022-08-05 NOTE — Progress Notes (Signed)
Subjective: No chief complaint on file.   56 year old female presents the office today for follow-up evaluation of a wound to the left second toe.  She states that she is doing much better.  The wound is healed she is not in any pain.  She is back to her regular shoe.  Also from the surgery Lisfranc fusion she states it is feeling better than prior to surgery not having significant pain.   Objective: General: No acute distress, AAOx3  DP/PT pulses palpable 2/4, CRT < 3 sec to all digits including the toe in question. Protective sensation decreased  Left foot: Incision is well coapted without any evidence of dehiscence and scars are formed.  Hammertoes are present which are semirigid.  On the second toe particularly there is no significant callus formation or any open lesions.  There is no pain.  There is no erythema or warmth or any signs of infection.  Still some slight edema but this appears to be improved. No pain with calf compression, swelling, warmth, erythema.   Assessment and Plan:  Hammertoes left foot, healed wound  -Treatment options discussed including all alternatives, risks, and complications -X-rays were obtained reviewed of the left foot.  3 views were obtained.  Status post Lisfranc arthrodesis with hardware intact.  Hammertoes are present.  No definitive cortical destruction suggestive of osteomyelitis. -I discussed with her doing a flexor tenotomy but this point is not causing any issues.  We discussed pros and cons of doing this and ultimately decided to hold off on this today.  Continue offloading, supportive shoes.  If symptoms were to recur will consider reevaluation of tenotomy.  I think at some point she likely needs to have hammertoe repair.  Trula Slade DPM

## 2022-08-23 ENCOUNTER — Other Ambulatory Visit (HOSPITAL_COMMUNITY)
Admission: RE | Admit: 2022-08-23 | Discharge: 2022-08-23 | Disposition: A | Discharge status: Home / Self Care | Payer: Managed Care, Other (non HMO) | Source: Ambulatory Visit | Attending: Obstetrics & Gynecology | Admitting: Obstetrics & Gynecology

## 2022-08-23 ENCOUNTER — Encounter: Payer: Self-pay | Admitting: Obstetrics & Gynecology

## 2022-08-23 ENCOUNTER — Ambulatory Visit (INDEPENDENT_AMBULATORY_CARE_PROVIDER_SITE_OTHER): Payer: Managed Care, Other (non HMO) | Admitting: Obstetrics & Gynecology

## 2022-08-23 VITALS — BP 146/87 | HR 114 | Ht 67.5 in | Wt 203.0 lb

## 2022-08-23 DIAGNOSIS — N761 Subacute and chronic vaginitis: Secondary | ICD-10-CM | POA: Insufficient documentation

## 2022-08-23 NOTE — Progress Notes (Signed)
   Subjective:    Patient ID: Helen Perez, female    DOB: [birth year], 56 y.o.   MRN: [id number]  HPI  56 yo female presents for evaluation of vaginal discharge.  She has been treated for vaginal yeast in the past.  She also completed a 6 week course (1 tab a week) for cutaneous yeast of bilateral axilla.  Her vaginal symptoms did not improve during this time (axilla did improve)  She does not remember being screened for STDs or BV.Marland Kitchen  She was last sexually active 6 months ago.   Review of Systems  Constitutional: Negative.        2 hot flashes a day--menopause  Respiratory: Negative.    Cardiovascular: Negative.   Gastrointestinal: Negative.   Genitourinary:  Positive for vaginal discharge. Negative for vaginal bleeding.       Objective:   Physical Exam Vitals reviewed.  Constitutional:      General: She is not in acute distress.    Appearance: She is well-developed.  HENT:     Head: Normocephalic and atraumatic.  Eyes:     Conjunctiva/sclera: Conjunctivae normal.  Cardiovascular:     Rate and Rhythm: Normal rate.  Pulmonary:     Effort: Pulmonary effort is normal.  Genitourinary:    Comments: Tanner V Vulva:  No lesion Vagina:  Pink, no lesions, small amount of milky discharge, no blood Cervix:  surgically absent Uterus:  surgically absent Right adnexa--non tender, no mass Left adnexa--non tender, no mass Skin:    General: Skin is warm and dry.  Neurological:     Mental Status: She is alert and oriented to person, place, and time.  Psychiatric:        Mood and Affect: Mood normal.    Vitals:   08/23/22 1108  BP: (!) 146/87  Pulse: (!) 114  Weight: 203 lb (92.1 kg)  Height: 5' 7.5" (1.715 m)      Assessment & Plan:  56 yo female with long stnading vaginal discharge  Aptima and STD screening Will treat based on results Mammogram ordered Will need annual exam at her convenience.

## 2022-08-24 ENCOUNTER — Telehealth: Payer: Self-pay | Admitting: Neurology

## 2022-08-24 LAB — CERVICOVAGINAL ANCILLARY ONLY
Bacterial Vaginitis (gardnerella): NEGATIVE
Candida Glabrata: POSITIVE — AB
Candida Vaginitis: NEGATIVE
Chlamydia: NEGATIVE
Comment: NEGATIVE
Comment: NEGATIVE
Comment: NEGATIVE
Comment: NEGATIVE
Comment: NEGATIVE
Comment: NORMAL
Neisseria Gonorrhea: NEGATIVE
Trichomonas: NEGATIVE

## 2022-08-24 NOTE — Telephone Encounter (Signed)
[employer] pending faxed notes  

## 2022-08-27 ENCOUNTER — Other Ambulatory Visit: Payer: Self-pay | Admitting: Obstetrics & Gynecology

## 2022-08-27 MED ORDER — BORIC ACID POWD: 0 refills | Status: DC

## 2022-08-27 NOTE — Progress Notes (Signed)
600 mg boric acid capsules per vagina nightly for 2 weeks for candida glabrata

## 2022-09-02 ENCOUNTER — Telehealth: Payer: Self-pay

## 2022-09-02 NOTE — Telephone Encounter (Signed)
-----   Message from Guss Bunde, MD sent at 08/27/2022  5:53 AM EST ----- Pt does not have my chart.  Please call with results and Rx was sent to [facility].

## 2022-09-02 NOTE — Telephone Encounter (Signed)
Spoke with pt and she is aware of positive yeast and states she has picked up Rx from pharmacy.

## 2022-09-06 ENCOUNTER — Ambulatory Visit (INDEPENDENT_AMBULATORY_CARE_PROVIDER_SITE_OTHER): Payer: Managed Care, Other (non HMO) | Admitting: Internal Medicine

## 2022-09-06 ENCOUNTER — Encounter: Payer: Self-pay | Admitting: Internal Medicine

## 2022-09-06 VITALS — BP 126/88 | HR 74 | Ht 67.5 in | Wt 201.0 lb

## 2022-09-06 DIAGNOSIS — E1165 Type 2 diabetes mellitus with hyperglycemia: Secondary | ICD-10-CM

## 2022-09-06 DIAGNOSIS — E1142 Type 2 diabetes mellitus with diabetic polyneuropathy: Secondary | ICD-10-CM | POA: Diagnosis not present

## 2022-09-06 DIAGNOSIS — E1159 Type 2 diabetes mellitus with other circulatory complications: Secondary | ICD-10-CM | POA: Diagnosis not present

## 2022-09-06 DIAGNOSIS — Z794 Long term (current) use of insulin: Secondary | ICD-10-CM

## 2022-09-06 LAB — POCT GLYCOSYLATED HEMOGLOBIN (HGB A1C): Hemoglobin A1C: 10.6 % — AB (ref 4.0–5.6)

## 2022-09-06 MED ORDER — TOUJEO MAX SOLOSTAR 300 UNIT/ML ~~LOC~~ SOPN: 38.0000 [IU] | PEN_INJECTOR | Freq: Every day | SUBCUTANEOUS | 6 refills | Status: DC

## 2022-09-06 MED ORDER — TIRZEPATIDE 5 MG/0.5ML ~~LOC~~ SOAJ: 5.0000 mg | SUBCUTANEOUS | 2 refills | Status: DC

## 2022-09-06 MED ORDER — NOVOLOG PENFILL 100 UNIT/ML ~~LOC~~ SOCT: SUBCUTANEOUS | 3 refills | Status: DC

## 2022-09-06 MED ORDER — INPEN 100-PINK-NOVOLOG-FIASP DEVI: 1.0000 | Freq: Once | 0 refills | Status: DC

## 2022-09-06 NOTE — Patient Instructions (Signed)
-   Stop Ozempic  - Start Mounjaro 5 mg weekly  - Increase  Toujeo  38 units daily  - Continue Novolog 20 units with Breakfast and 20 units Lunch and 20 units with Supper - Novolog correctional insulin: ADD extra units on insulin to your meal-time Novolog dose if your blood sugars are higher than 150. Use the scale below to help guide you:   Blood sugar before meal Number of units to inject  Less than 150 0 unit  151 -  170 1 units  171 -  185 2 units  186 -  205 3 units  206 -  225 4 units  226 -  245 5 units  246 -  266 6 units  267 -  286 7 units  287 -  306 8 units  306 - 326 9 units       HOW TO TREAT LOW BLOOD SUGARS (Blood sugar LESS THAN 70 MG/DL) Please follow the RULE OF 15 for the treatment of hypoglycemia treatment (when your (blood sugars are less than 70 mg/dL)   STEP 1: Take 15 grams of carbohydrates when your blood sugar is low, which includes:  3-4 GLUCOSE TABS  OR 3-4 OZ OF JUICE OR REGULAR SODA OR ONE TUBE OF GLUCOSE GEL    STEP 2: RECHECK blood sugar in 15 MINUTES STEP 3: If your blood sugar is still low at the 15 minute recheck --> then, go back to STEP 1 and treat AGAIN with another 15 grams of carbohydrates.

## 2022-09-06 NOTE — Progress Notes (Signed)
Name: Helen Perez  Age/ Sex: 56 y.o., female   MRN/ DOB: [id number], [birth year]     PCP: Samuel Bouche, NP   Reason for Endocrinology Evaluation: Type 2 Diabetes Mellitus  Initial Endocrine Consultative Visit: 01/30/2020    PATIENT IDENTIFIER: Helen Perez is a 56 y.o. female with a past medical history of HTN, TIA, OSA and T2DM. The patient has followed with Endocrinology clinic since 01/30/2020 for consultative assistance with management of her diabetes.  DIABETIC HISTORY:  Helen Perez was diagnosed with T2DM in 2000. She reports intolerance to Metformin.  She has been on an insulin pump for years.  Her hemoglobin A1c has ranged from 10.1% in 12/10/2019, peaking at 15.7% in 2016.  On her initial visit to our clinic her A1c was 12.5% , she was on Ozempic and novolog through insulin pump. We did not make any changes as she  Uses the pump only periodically as well as the Decox. Chronic hx of non-compliance, and was given the option to switch to MDI vs using the pump properly , she opted to continue with the pump at the time.     By 04/2020 we stopped insulin pump and started MDI regimen   Farxiga started 10/2020 and stopped by 02/2021 due to worsening genital infections   SUBJECTIVE:   During the last visit (03/03/2022): A1c 8.0% .       Today (09/06/2022): Helen Perez is here for a follow up on diabetes  She checks her blood sugars multiple  times daily, through the dexcom. The patient has not had hypoglycemic episodes since the last clinic visit.  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 Denies nausea, vomiting or diarrhea     HOME DIABETES REGIMEN:  - Toujeo  34 units once daily  - Novolog 20 units TIDQAC   - Ozempic 2 mg weekly  - Novolog (BG-130/20)     Statin: Yes ACE-I/ARB: yes     CONTINUOUS GLUCOSE MONITORING RECORD INTERPRETATION    Dates of Recording: 11/7-11/20/2023   Sensor description:Dexcom  Results statistics:   CGM use % of time  79 %  Average and SD 261/69  Time in range 11  % Time Above 180 28  % Time above 250 60  % Time Below target <1       Glycemic patterns summary: Glucose readings are high during the day and night Hyperglycemic episodes all night but worse postprandial  Hypoglycemic episodes occurred following a bolus   Overnight periods: High    INpen Report :no updated report      DIABETIC COMPLICATIONS: Microvascular complications:  Neuropathy Denies: retinopathy,  CKD Last eye exam: Completed 2021   Macrovascular complications:  CVA Denies: CAD, PVD    HISTORY:  Past Medical History:  Past Medical History:  Diagnosis Date   Anxiety    Depression    Diabetes mellitus without complication ([place])    H/O syphilis    HSV-2 infection    Hypertension associated with diabetes ([place]) 03/30/2018   Renal insufficiency 10/13/2018   Sleep apnea    CPAP   Stroke [facility])    Past Surgical History:  Past Surgical History:  Procedure Laterality Date   ABDOMINAL HYSTERECTOMY  FOOT SURGERY Right    Social History:  reports that she quit smoking about 10 years ago. Her smoking use included cigarettes. She has never used smokeless tobacco. She reports current alcohol use. She reports that she does not use drugs. Family History:  Family History  Problem Relation Age of Onset   Diabetes Mother    Heart disease Mother    Heart failure Mother    Kidney failure Father    Diabetes Sister    Kidney failure Sister    Sleep apnea Sister    Other Brother        quadraplegia   Diabetes Brother    Kidney failure Brother    Sleep apnea Brother    Diabetes Brother    Diabetes Maternal Grandmother    Colon cancer Neg Hx    Esophageal  cancer Neg Hx    Pancreatic cancer Neg Hx    Stomach cancer Neg Hx    Liver disease Neg Hx      HOME MEDICATIONS: Allergies as of 09/06/2022       Reactions   Adhesive [tape]    From dexcom   Latex Itching, Swelling   Other Dermatitis   FREESTYLE LIBRE SENSOR- cellulitis, wound on skin         Medication List        Accurate as of September 06, 2022 11:40 AM. If you have any questions, ask your nurse or doctor.          STOP taking these medications    cephALEXin 500 MG capsule Commonly known as: KEFLEX Stopped by: Dorita Sciara, MD   methylPREDNISolone 4 MG Tbpk tablet Commonly known as: MEDROL DOSEPAK Stopped by: Dorita Sciara, MD   Ozempic (2 MG/DOSE) 8 MG/3ML Sopn Generic drug: Semaglutide (2 MG/DOSE) Stopped by: Dorita Sciara, MD       TAKE these medications    acetaminophen 650 MG CR tablet Commonly known as: TYLENOL Take 1 tablet by mouth as needed.   acyclovir ointment 5 % Commonly known as: ZOVIRAX Apply topically.   amitriptyline 25 MG tablet Commonly known as: ELAVIL TAKE 1 TABLET BY MOUTH EVERY NIGHT AT BEDTIME   aspirin EC 81 MG tablet Take 1 tablet by mouth daily.   atorvastatin 40 MG tablet Commonly known as: LIPITOR Take 1 tablet (40 mg total) by mouth daily.   Boric Acid Powd Insert one capsule per vagina nightly for 2 weeks   buPROPion HCl ER (XL) 450 MG Tb24 Take 450 mg by mouth daily.   clindamycin 1 % Swab Commonly known as: CLEOCIN T Apply 1 Application topically 2 (two) times daily. Apply to the affected areas in the axilla and groin.   clotrimazole-betamethasone cream Commonly known as: Lotrisone Apply 1 Application topically 2 (two) times daily.   Dexcom G6 Receiver Devi USE AS INSTRUCTED TO CHECK BLOOD SUGAR DAILY   Dexcom G6 Sensor Misc INSERT 1 DEVICE INTO THE SKIN EVERY 10 DAYS   Dexcom G6 Transmitter Misc USE 1 EVERY 3 MONTHS AS DIRECTED   doxycycline 100 MG capsule Commonly  known as: VIBRAMYCIN Take 100 mg by mouth 2 (two) times daily.   fexofenadine-pseudoephedrine 180-240 MG 24 hr tablet Commonly known as: ALLEGRA-D 24 Take 1 tablet by mouth daily as needed.   fluconazole 150 MG tablet Commonly known as: DIFLUCAN Take 1 tablet (150 mg total) by mouth once a week.   furosemide 20 MG tablet Commonly known as: LASIX TAKE 1 TABLET BY MOUTH DAILY.  IF SIGNIFICANT SWELLING THAT IS NOT RESPONDING TO ELEVATION AND COMPRESSION, TAKE 1 EXTRA TABLET BY MOUTH NO MORE THAN 3 TIMES WEEKLY.   HYDROcodone-acetaminophen 5-325 MG tablet Commonly known as: NORCO/VICODIN Take 1-2 tablets by mouth every 6 (six) hours as needed.   hydrOXYzine 50 MG tablet Commonly known as: ATARAX Take 1 tablet (50 mg total) by mouth at bedtime and may repeat dose one time if needed. For itching.   InPen 100-Pink-Novolog-Fiasp Devi Generic drug: injection device for insulin 1 Device by Other route once for 1 dose. What changed:  how much to take how to take this when to take this additional instructions Changed by: Dorita Sciara, MD   Insulin Pen Needle 29G X 5MM Misc 1 Device by Does not apply route as directed.   lisinopril 5 MG tablet Commonly known as: ZESTRIL Take 1 tablet by mouth daily.   meloxicam 15 MG tablet Commonly known as: MOBIC Take 15 mg by mouth daily.   mupirocin ointment 2 % Commonly known as: BACTROBAN Apply 1 application topically. 2-3 times daily   mupirocin ointment 2 % Commonly known as: BACTROBAN Apply 1 Application topically 2 (two) times daily.   NONFORMULARY OR COMPOUNDED ITEM Antifungal solution: Terbinafine 3%, Fluconazole 2%, Tea Tree Oil 5%, Urea 10%, Ibuprofen 2% in DMSO suspension #1m   NovoLOG PenFill cartridge Generic drug: insulin aspart MAX DAILY DOSE 80 UNITS   Nystatin Powd Apply 1 application. topically 3 (three) times daily as needed.   omeprazole 20 MG capsule Commonly known as: PRILOSEC Take 1 capsule by  mouth daily.   oxyCODONE-acetaminophen 5-325 MG tablet Commonly known as: PERCOCET/ROXICET Take 1-2 tablets by mouth every 6 (six) hours as needed for severe pain.   promethazine 25 MG tablet Commonly known as: PHENERGAN Take 1 tablet (25 mg total) by mouth every 8 (eight) hours as needed for nausea or vomiting.   senna-docusate 8.6-50 MG tablet Commonly known as: Senokot-S Take by mouth.   Skin Tac Adhesive Barrier Wipe Misc 1 applicator by Does not apply route as directed.   tirzepatide 5 MG/0.5ML Pen Commonly known as: MOUNJARO Inject 5 mg into the skin once a week. Started by: IDorita Sciara MD   topiramate 25 MG tablet Commonly known as: Topamax Take 1 tablet (25 mg total) by mouth daily for 7 days, THEN 1 tablet (25 mg total) 2 (two) times daily for 7 days, THEN 2 tablets (50 mg total) 2 (two) times daily for 16 days. Start taking on: Feb 19, 2022   Toujeo Max SoloStar 300 UNIT/ML Solostar Pen Generic drug: insulin glargine (2 Unit Dial) Inject 38 Units into the skin daily. What changed: how much to take Changed by: IDorita Sciara MD   triamcinolone 55 MCG/ACT Aero nasal inhaler Commonly known as: NASACORT Place 2 sprays into the nose daily.   triamcinolone cream 0.5 % Commonly known as: KENALOG Apply 1 application topically 2 (two) times daily. To affected areas.   Trulance 3 MG Tabs Generic drug: Plecanatide TAKE 1 TABLET BY MOUTH DAILY AS NEEDED   valACYclovir 500 MG tablet Commonly known as: VALTREX Take 1 tablet (500 mg total) by mouth daily.   Vitamin D (Ergocalciferol) 1.25 MG (50000 UNIT) Caps capsule Commonly known as: DRISDOL TAKE 1 CAPSULE BY MOUTH EVERY 7 DAYS   zolpidem 12.5 MG CR tablet Commonly known as: AMBIEN CR TAKE ONE TABLET BY MOUTH EVERY NIGHT AT BEDTIME AS NEEDED FOR SLEEP         OBJECTIVE:  Vital Signs: BP 126/88 (BP Location: Left Arm, Patient Position: Sitting, Cuff Size: Large)   Pulse 74   Ht 5' 7.5"  (1.715 m)   Wt 201 lb (91.2 kg)   SpO2 96%   BMI 31.02 kg/m   Wt Readings from Last 3 Encounters:  09/06/22 201 lb (91.2 kg)  08/23/22 203 lb (92.1 kg)  08/03/22 208 lb (94.3 kg)     Exam: General: Pt appears well and is in NAD  Lungs: Clear with good BS bilat with no rales, rhonchi, or wheezes  Heart: RRR   Extremities: No pretibial edema. Left foot boot in place   Neuro: MS is good with appropriate affect, pt is alert and Ox3     DATA REVIEWED:  Lab Results  Component Value Date   HGBA1C 10.6 (A) 09/06/2022   HGBA1C 8.0 (A) 03/03/2022   HGBA1C 7.8 (H) 10/02/2021   Lab Results  Component Value Date   MICROALBUR '150mg'$  05/26/2021   LDLCALC 58 02/04/2021   CREATININE 1.07 (H) 01/18/2022   Lab Results  Component Value Date   MICRALBCREAT 30-'300mg'$ /g 05/26/2021     Lab Results  Component Value Date   CHOL 129 02/04/2021   HDL 52 02/04/2021   LDLCALC 58 02/04/2021   TRIG 108 02/04/2021   CHOLHDL 2.5 02/04/2021         ASSESSMENT / PLAN / RECOMMENDATIONS:   1) Type 2 Diabetes Mellitus, with improving glycemic control, With neuropathic and macrovascular  complications - Most recent A1c of 10.5 %. Goal A1c < 7.0 %.     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 hence lost track of her self-care -I have again emphasized the importance of taking insulin daily specially Toujeo, which I will increase -I will switch Ozempic to [facility], she was provided with a coupon today, she was advised to contact me in 2 months if no tolerance so I can increase the dose    MEDICATIONS: -Increase Toujeo  38 units once daily  - Take Novolog to 20 units with Breakfast, 20 units with  lunch and 20 units with Supper  -Stop Ozempic -Start Mounjaro 5 mg weekly -Correction factor : Novolog (BG-130/20)   EDUCATION / INSTRUCTIONS: BG monitoring instructions: Patient is instructed to check her blood sugars 4 times a day, before meals and bedtime . Call [facility] if: BG persistently < 70  I reviewed the Rule of 15 for the treatment of hypoglycemia in detail with the patient. Literature supplied.    2) Diabetic complications:  Eye: Does not have known diabetic retinopathy.  Neuro/ Feet: Does have known diabetic peripheral neuropathy .  Renal: Patient does not have known baseline CKD. She   is  on an ACEI/ARB at present.      F/U in 4 months    Signed electronically by: Mack Guise, MD  [facility]  [facility] [address] [place], [place] [address] Phone: [phone] FAX: [phone]   CC: Samuel Bouche, [place] [address] Phone: [phone]  Fax: [phone]  Return to Endocrinology clinic as below: Future Appointments  Date Time Provider [place]  11/04/2022  1:15 PM Trula Slade, DPM [facility]  01/17/2023  2:40 PM Paiton Fosco, Melanie Crazier, MD [facility] None

## 2022-09-08 ENCOUNTER — Telehealth: Payer: Self-pay

## 2022-09-08 ENCOUNTER — Other Ambulatory Visit (HOSPITAL_COMMUNITY): Payer: Self-pay

## 2022-09-08 NOTE — Telephone Encounter (Signed)
Pharmacy Patient Advocate Encounter   Received notification from [facility] that prior authorization for [facility] '5mg'$ /0.59m is required/requested.    PA submitted on 09/08/22 to [employer]  via [facility] [place] [place] is pending

## 2022-09-10 NOTE — Telephone Encounter (Signed)
Patient Advocate Encounter  Prior Authorization for [employer] '5MG'$ /0.5ML pen-injectors has been approved.    Effective: 09-09-2022 to 09-10-2023    Determination letter attached to chart

## 2022-09-13 NOTE — Telephone Encounter (Signed)
Patient notified

## 2022-09-13 NOTE — Telephone Encounter (Signed)
[employer] denied the NPSG.  HST [employer] no auth req via [employer] ref # [id number] EE left VM 09/01/22 [place]

## 2022-09-14 ENCOUNTER — Other Ambulatory Visit: Payer: Self-pay | Admitting: Medical-Surgical

## 2022-09-14 NOTE — Telephone Encounter (Signed)
Scheduled her appointment for 10/12/2022 @ 2:30. tvt

## 2022-09-14 NOTE — Telephone Encounter (Signed)
Patient needs an appointment for further refills on  Amitriptyline HCL 25 MG PO Tabs  Last office visit 06/17/2022  Last filled 03/22/2022  Send 30 day supply to the pharmacy. No refills.

## 2022-09-20 NOTE — Telephone Encounter (Signed)
Patient returned out call.  HST- [employer] no auth req via [employer] ref # [id number]   She is scheduled at [facility] 10/12/22 at 8 AM.  Mailed packet to the patient.

## 2022-09-22 ENCOUNTER — Other Ambulatory Visit: Payer: Self-pay | Admitting: Internal Medicine

## 2022-09-25 ENCOUNTER — Other Ambulatory Visit: Payer: Self-pay | Admitting: Internal Medicine

## 2022-09-27 ENCOUNTER — Other Ambulatory Visit: Payer: Self-pay | Admitting: Medical-Surgical

## 2022-10-04 ENCOUNTER — Other Ambulatory Visit: Payer: Self-pay | Admitting: Podiatry

## 2022-10-06 NOTE — Telephone Encounter (Signed)
Patient left a voicemail on my phone stating she wanted to r/s her HST.  I called her back but no answer left voicemail for her to call me back to r/s.

## 2022-10-06 NOTE — Telephone Encounter (Signed)
I called the patient again, she is r/s for 11/09/22 at 3 pm.  Mailed new packet to the patient.

## 2022-10-11 ENCOUNTER — Other Ambulatory Visit: Payer: Self-pay | Admitting: Medical-Surgical

## 2022-10-12 ENCOUNTER — Telehealth: Payer: Self-pay | Admitting: Podiatry

## 2022-10-12 ENCOUNTER — Encounter: Payer: Self-pay | Admitting: Medical-Surgical

## 2022-10-12 ENCOUNTER — Ambulatory Visit (INDEPENDENT_AMBULATORY_CARE_PROVIDER_SITE_OTHER): Payer: Managed Care, Other (non HMO) | Admitting: Medical-Surgical

## 2022-10-12 VITALS — BP 135/80 | HR 116 | Resp 20 | Ht 67.5 in | Wt 205.9 lb

## 2022-10-12 DIAGNOSIS — E1159 Type 2 diabetes mellitus with other circulatory complications: Secondary | ICD-10-CM | POA: Diagnosis not present

## 2022-10-12 DIAGNOSIS — F419 Anxiety disorder, unspecified: Secondary | ICD-10-CM

## 2022-10-12 DIAGNOSIS — I152 Hypertension secondary to endocrine disorders: Secondary | ICD-10-CM

## 2022-10-12 DIAGNOSIS — L608 Other nail disorders: Secondary | ICD-10-CM | POA: Insufficient documentation

## 2022-10-12 DIAGNOSIS — G4700 Insomnia, unspecified: Secondary | ICD-10-CM | POA: Diagnosis not present

## 2022-10-12 MED ORDER — AMITRIPTYLINE HCL 50 MG PO TABS: 50.0000 mg | ORAL_TABLET | Freq: Every day | ORAL | 1 refills | Status: DC

## 2022-10-12 MED ORDER — INSULIN PEN NEEDLE 29G X 5MM MISC: 1.0000 | 11 refills | Status: DC

## 2022-10-12 MED ORDER — NONFORMULARY OR COMPOUNDED ITEM: 3 refills | Status: DC

## 2022-10-12 NOTE — Telephone Encounter (Signed)
Pt called and her pcp took a picture of the nail and it is black under nail and she is wanting to get in asap. She is scheduled for 1.18 and is on waitlist but would like you to look at the picture and see what you think. She stated she is worried it is a blood clot or something under the nail.

## 2022-10-12 NOTE — Progress Notes (Unsigned)
   Established Patient Office Visit  Subjective   Patient ID: Helen Perez, female   DOB: 24-Apr-1966 Age: 56 y.o. MRN: [id number]   No chief complaint on file.  HPI Pleasant 56 year old female presenting today for the following:  Mood/Insomnia:  Black toenail:    Objective:    There were no vitals filed for this visit.  Physical Exam   No results found for this or any previous visit (from the past 24 hour(s)).   {Labs (Optional):23779}  The ASCVD Risk score (Arnett DK, et al., 2019) failed to calculate for the following reasons:   The valid total cholesterol range is 130 to 320 mg/dL   Assessment & Plan:   No problem-specific Assessment & Plan notes found for this encounter.   No follow-ups on file.  ___________________________________________ Clearnce Sorrel, DNP, APRN, FNP-BC [facility] [place]

## 2022-10-13 ENCOUNTER — Other Ambulatory Visit: Payer: Self-pay

## 2022-10-13 MED ORDER — INSULIN PEN NEEDLE 29G X 5MM MISC: 11 refills | Status: DC

## 2022-10-14 ENCOUNTER — Ambulatory Visit (INDEPENDENT_AMBULATORY_CARE_PROVIDER_SITE_OTHER): Payer: Managed Care, Other (non HMO) | Admitting: Podiatry

## 2022-10-14 DIAGNOSIS — S90222A Contusion of left lesser toe(s) with damage to nail, initial encounter: Secondary | ICD-10-CM

## 2022-10-14 DIAGNOSIS — L601 Onycholysis: Secondary | ICD-10-CM

## 2022-10-14 DIAGNOSIS — R52 Pain, unspecified: Secondary | ICD-10-CM

## 2022-10-14 MED ORDER — CEPHALEXIN 500 MG PO CAPS: 500.0000 mg | ORAL_CAPSULE | Freq: Three times a day (TID) | ORAL | 0 refills | Status: DC

## 2022-10-14 NOTE — Patient Instructions (Signed)
Soak Instructions- MAKE SURE SOMEONE ELSE CHECKS THE TEMPERATURE OF THE WATER    THE DAY AFTER THE PROCEDURE  Place 1/4 cup of epsom salts in a quart of warm tap water.  Submerge your foot or feet with outer bandage intact for the initial soak; this will allow the bandage to become moist and wet for easy lift off.  Once you remove your bandage, continue to soak in the solution for 20 minutes.  This soak should be done twice a day.  Next, remove your foot or feet from solution, blot dry the affected area and cover.  You may use a band aid large enough to cover the area or use gauze and tape.  Apply other medications to the area as directed by the doctor such as polysporin neosporin.  IF YOUR SKIN BECOMES IRRITATED WHILE USING THESE INSTRUCTIONS, IT IS OKAY TO SWITCH TO  WHITE VINEGAR AND WATER. Or you may use antibacterial soap and water to keep the toe clean  Monitor for any signs/symptoms of infection. Call the office immediately if any occur or go directly to the emergency room. Call with any questions/concerns.

## 2022-10-15 ENCOUNTER — Other Ambulatory Visit: Payer: Self-pay | Admitting: Internal Medicine

## 2022-10-15 ENCOUNTER — Other Ambulatory Visit: Payer: Self-pay | Admitting: Medical-Surgical

## 2022-10-15 MED ORDER — INSULIN PEN NEEDLE 31G X 5 MM MISC: 11 refills | Status: DC

## 2022-10-19 NOTE — Progress Notes (Signed)
Subjective: Chief Complaint  Patient presents with   Foot Problem    Patient has had a darkness under the left great toe since last week, patient has not injured foot or have a family hx of melanoma    57 year old female presents the office today with above concerns, which are new.  She said that she noticed the left big toe become dark suddenly.  She does not recall any injuries.  No significant pain.  No drainage or pus.   Objective: AAO x3, NAD DP/PT pulses palpable bilaterally, CRT less than 3 seconds The left hallux nail has dark discoloration to the entire toenail starting it was on the proximal nail fold.  There is some slight edema around the nail fold there is no drainage or pus.  No erythema or signs of infection. No pain with calf compression, swelling, warmth, erythema  Assessment: 57 year old female with with a subungual hematoma left hallux toenail  Plan: -All treatment options discussed with the patient including all alternatives, risks, complications.  -Given amount of blood in the toenail nails already starting to loosen the proximal nail fold I have recommended removal of the nail biopsy.  However A1c is elevated this is for increased risk of healing complications and she is well aware of this.  Consent was signed. -At this time, recommended total nail removal without chemical matricectomy. Risks and complications were discussed with the patient for which they understand and  verbally consent to the procedure. Under sterile conditions a total of 3 mL of a mixture of 2% lidocaine plain and 0.5% Marcaine plain was infiltrated in a hallux block fashion. Once anesthetized, the skin was prepped in sterile fashion. A tourniquet was then applied.  Of the left hallux nail was then removed in total making sure remove all nail borders.  I did appear to be dried blood underneath the toenail.  There is no underlying laceration.  Once the nail was removed, the area was debrided and the  underlying skin was intact. The area was irrigated and hemostasis was obtained.  A dry sterile dressing was applied. After application of the dressing the tourniquet was removed and there is found to be an immediate capillary refill time to the digit. The patient tolerated the procedure well any complications. Post procedure instructions were discussed the patient for which he verbally understood. Follow-up in one week for nail check or sooner if any problems are to arise. Discussed signs/symptoms of worsening infection and directed to call the office immediately should any occur or go directly to the emergency room. In the meantime, encouraged to call the office with any questions, concerns, changes symptoms. -Patient encouraged to call the office with any questions, concerns, change in symptoms.   Trula Slade DPM

## 2022-10-20 ENCOUNTER — Other Ambulatory Visit: Payer: Self-pay | Admitting: Medical-Surgical

## 2022-10-20 DIAGNOSIS — K5902 Outlet dysfunction constipation: Secondary | ICD-10-CM

## 2022-10-22 ENCOUNTER — Ambulatory Visit (INDEPENDENT_AMBULATORY_CARE_PROVIDER_SITE_OTHER): Payer: Managed Care, Other (non HMO) | Admitting: *Deleted

## 2022-10-22 DIAGNOSIS — S90222A Contusion of left lesser toe(s) with damage to nail, initial encounter: Secondary | ICD-10-CM

## 2022-10-22 DIAGNOSIS — Z9889 Other specified postprocedural states: Secondary | ICD-10-CM

## 2022-10-22 NOTE — Progress Notes (Signed)
Patient presents today for nail check of the left hallux, nail avulsion, patient of Dr. Jacqualyn Posey.  The toe was healed. There was no drainage. She has still been wrapping the toe. I advised she could stop.   She will monitor for any changes. She has an appointment with Dr. Jacqualyn Posey for follow up on 11/04/22

## 2022-10-29 ENCOUNTER — Other Ambulatory Visit: Payer: Self-pay | Admitting: Medical-Surgical

## 2022-10-29 ENCOUNTER — Encounter: Payer: Self-pay | Admitting: Medical-Surgical

## 2022-10-29 MED ORDER — AMITRIPTYLINE HCL 50 MG PO TABS: 50.0000 mg | ORAL_TABLET | Freq: Every day | ORAL | 1 refills | Status: DC

## 2022-10-29 MED ORDER — ATORVASTATIN CALCIUM 40 MG PO TABS: 40.0000 mg | ORAL_TABLET | Freq: Every day | ORAL | 0 refills | Status: DC

## 2022-10-29 MED ORDER — NONFORMULARY OR COMPOUNDED ITEM: 3 refills | Status: DC

## 2022-10-29 MED ORDER — BUPROPION HCL ER (XL) 450 MG PO TB24: 450.0000 mg | ORAL_TABLET | Freq: Every day | ORAL | 1 refills | Status: DC

## 2022-10-29 MED ORDER — INSULIN PEN NEEDLE 31G X 5 MM MISC: 11 refills | Status: DC

## 2022-11-04 ENCOUNTER — Ambulatory Visit (INDEPENDENT_AMBULATORY_CARE_PROVIDER_SITE_OTHER): Payer: Managed Care, Other (non HMO)

## 2022-11-04 ENCOUNTER — Ambulatory Visit (INDEPENDENT_AMBULATORY_CARE_PROVIDER_SITE_OTHER): Payer: Managed Care, Other (non HMO) | Admitting: Podiatry

## 2022-11-04 DIAGNOSIS — S90222A Contusion of left lesser toe(s) with damage to nail, initial encounter: Secondary | ICD-10-CM | POA: Diagnosis not present

## 2022-11-04 DIAGNOSIS — M19072 Primary osteoarthritis, left ankle and foot: Secondary | ICD-10-CM

## 2022-11-04 DIAGNOSIS — M2042 Other hammer toe(s) (acquired), left foot: Secondary | ICD-10-CM

## 2022-11-04 NOTE — Progress Notes (Signed)
Subjective: Chief Complaint  Patient presents with   Follow-up    Hammer toe on the left foot    57 year old female presents the office today with above concerns.  She states the procedure site is healing well and having no issues with this.  She is noticing dark discoloration of her right big toenail.  She states that she stopped wearing the compression socks that she thought that is what was causing the left big toenail to start.  Since that she has noticed increased warmth of the foot on the left side.  No calf pain.  Objective: AAO x3, NAD DP/PT pulses palpable bilaterally, CRT less than 3 seconds Left hallux nail bed appears to be healed.  There is no edema, erythema or signs of infection.  The right hallux nail has some dried blood present nails mildly hypertrophic but there is no pain.  No edema, erythema or signs of infection the nail is well adhered at this time. There is some increased edema noted at the dorsal aspect the left foot there is no erythema or warmth associated with this.  There is no significant pain.  Hammertoes are present. No pain with calf compression, swelling, warmth, erythema  Assessment: 57 year old female with with a subungual hematoma left hallux toenail  Plan: -All treatment options discussed with the patient including all alternatives, risks, complications.  -X-rays were obtained reviewed.  3 views of the foot were obtained.  Status post Lisfranc arthrodesis with hardware intact.  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  Flatfoot is present. -I think her increased months, if not wearing compression socks.  Get back to wearing this.  Continue shoes and good arch support as well.  If the swelling should worsen or not improve or there is any redness or warmth associated this to let me know. -Procedure site appears to be healed.  Monitor the right hallux toenail.  No follow-ups on file.  Trula Slade DPM

## 2022-11-07 NOTE — Progress Notes (Signed)
Established Patient Office Visit  Subjective   Patient ID: Helen Perez, female   DOB: 04-Feb-1966 Age: 57 y.o. MRN: [id number]   Chief Complaint  Patient presents with   Sinus Problem   HPI Pleasant 57 year old female presenting today with reports of 2 weeks of upper respiratory symptoms including right-sided facial pain with right sided sinus congestion, sore throat, and ear pain.  Notes that she feels like her ears have been leaking.  When she blows her nose, she occasionally gets a blood clot out which opens of her airway but then she reports that breathing makes her head hurt terribly.  She has not taken a COVID test and denies being in contact with anyone who has been sick recently.  No fevers, chills, shortness of breath, chest pain, or GI symptoms.  Able to eat and drink without difficulty.   Objective:    Vitals:   11/08/22 1129  BP: 137/75  Pulse: (!) 107  Resp: 20  Height: 5' 7.5" (1.715 m)  Weight: 215 lb 14.4 oz (97.9 kg)  SpO2: 98%  BMI (Calculated): 33.3    Physical Exam Vitals and nursing note reviewed.  Constitutional:      General: She is not in acute distress.    Appearance: Normal appearance. She is not ill-appearing.  HENT:     Head: Normocephalic and atraumatic.     Right Ear: Tympanic membrane, ear canal and external ear normal.     Left Ear: Tympanic membrane, ear canal and external ear normal.     Nose: Congestion present.     Right Sinus: Maxillary sinus tenderness and frontal sinus tenderness present.     Left Sinus: No maxillary sinus tenderness or frontal sinus tenderness.     Mouth/Throat:     Mouth: Mucous membranes are moist.     Pharynx: No oropharyngeal exudate or posterior oropharyngeal erythema.  Eyes:     General:        Right eye: No discharge.        Left eye: No discharge.     Extraocular Movements: Extraocular movements intact.     Conjunctiva/sclera: Conjunctivae normal.     Pupils: Pupils are equal, round, and reactive to  light.  Cardiovascular:     Rate and Rhythm: Normal rate and regular rhythm.     Pulses: Normal pulses.     Heart sounds: Normal heart sounds.  Pulmonary:     Effort: Pulmonary effort is normal. No respiratory distress.     Breath sounds: Normal breath sounds. No wheezing, rhonchi or rales.  Musculoskeletal:     Cervical back: Normal range of motion and neck supple.  Lymphadenopathy:     Cervical: No cervical adenopathy.  Skin:    General: Skin is warm and dry.  Neurological:     Mental Status: She is alert and oriented to person, place, and time.  Psychiatric:        Mood and Affect: Mood normal.        Behavior: Behavior normal.        Thought Content: Thought content normal.        Judgment: Judgment normal.   No results found for this or any previous visit (from the past 24 hour(s)).     The ASCVD Risk score (Arnett DK, et al., 2019) failed to calculate for the following reasons:   The valid total cholesterol range is 130 to 320 mg/dL   Assessment & Plan:   1. Acute non-recurrent maxillary sinusitis POCT COVID-negative.  Influenza a and B+.  With 2 weeks of symptoms and maxillary/frontal right-sided facial pain, we will go ahead and treat for sinusitis with cefdinir 300 mg twice daily x 7 days.  Okay to continue conservative measures at home for other symptoms.  Discussed the expectation for viral symptoms to last 2 to 3 weeks before full recovery is noted.  Postviral cough can last weeks to months. - POCT Influenza A/B - POC COVID-19   No follow-ups on file.  ___________________________________________ Clearnce Sorrel, DNP, APRN, FNP-BC Primary Care and [place]

## 2022-11-08 ENCOUNTER — Encounter: Payer: Self-pay | Admitting: Medical-Surgical

## 2022-11-08 ENCOUNTER — Ambulatory Visit (INDEPENDENT_AMBULATORY_CARE_PROVIDER_SITE_OTHER): Payer: Managed Care, Other (non HMO) | Admitting: Medical-Surgical

## 2022-11-08 VITALS — BP 137/75 | HR 107 | Resp 20 | Ht 67.5 in | Wt 215.9 lb

## 2022-11-08 DIAGNOSIS — J0100 Acute maxillary sinusitis, unspecified: Secondary | ICD-10-CM

## 2022-11-08 DIAGNOSIS — N1831 Chronic kidney disease, stage 3a: Secondary | ICD-10-CM

## 2022-11-08 DIAGNOSIS — E1142 Type 2 diabetes mellitus with diabetic polyneuropathy: Secondary | ICD-10-CM

## 2022-11-08 LAB — POCT INFLUENZA A/B
Influenza A, POC: NEGATIVE
Influenza B, POC: POSITIVE — AB

## 2022-11-08 LAB — POC COVID19 BINAXNOW: SARS Coronavirus 2 Ag: NEGATIVE

## 2022-11-08 MED ORDER — CEFDINIR 300 MG PO CAPS: 300.0000 mg | ORAL_CAPSULE | Freq: Two times a day (BID) | ORAL | 0 refills | Status: DC

## 2022-11-09 ENCOUNTER — Ambulatory Visit: Payer: Managed Care, Other (non HMO) | Admitting: Neurology

## 2022-11-09 ENCOUNTER — Other Ambulatory Visit: Payer: Self-pay

## 2022-11-09 DIAGNOSIS — E66811 Obesity, class 1: Secondary | ICD-10-CM

## 2022-11-09 DIAGNOSIS — E669 Obesity, unspecified: Secondary | ICD-10-CM

## 2022-11-09 DIAGNOSIS — Z820 Family history of epilepsy and other diseases of the nervous system: Secondary | ICD-10-CM

## 2022-11-09 DIAGNOSIS — R519 Headache, unspecified: Secondary | ICD-10-CM

## 2022-11-09 DIAGNOSIS — G4733 Obstructive sleep apnea (adult) (pediatric): Secondary | ICD-10-CM | POA: Diagnosis not present

## 2022-11-09 DIAGNOSIS — Z789 Other specified health status: Secondary | ICD-10-CM

## 2022-11-09 DIAGNOSIS — L601 Onycholysis: Secondary | ICD-10-CM

## 2022-11-09 DIAGNOSIS — R400 Somnolence: Secondary | ICD-10-CM

## 2022-11-09 DIAGNOSIS — G4719 Other hypersomnia: Secondary | ICD-10-CM

## 2022-11-09 DIAGNOSIS — R0683 Snoring: Secondary | ICD-10-CM

## 2022-11-09 DIAGNOSIS — R000 Tachycardia, unspecified: Secondary | ICD-10-CM

## 2022-11-09 DIAGNOSIS — G4734 Idiopathic sleep related nonobstructive alveolar hypoventilation: Secondary | ICD-10-CM

## 2022-11-10 ENCOUNTER — Encounter: Payer: Self-pay | Admitting: Podiatry

## 2022-11-11 NOTE — Progress Notes (Signed)
See procedure note.

## 2022-11-12 ENCOUNTER — Telehealth: Payer: Self-pay

## 2022-11-12 NOTE — Addendum Note (Signed)
Addended by: Star Age on: 11/12/2022 02:31 PM   Modules accepted: Orders

## 2022-11-12 NOTE — Telephone Encounter (Addendum)
Initiated Prior authorization MGQ:[id number] HCl ER (XL) '150MG'$  er tablets Via: Covermymeds Case/Key:[id number]  Status: approved  as of 11/12/22 Reason:Coverage Start Date:10/18/2022;Coverage End Date:11/17/2023; Notified Pt via: Mychart

## 2022-11-12 NOTE — Procedures (Signed)
[facility]  HOME SLEEP TEST (Watch PAT) REPORT  STUDY DATE: 11/10/2022  DOB: 1966/07/01  MRN: [id number]  ORDERING CLINICIAN: Star Age, MD, PhD   REFERRING CLINICIAN: Samuel Bouche, NP   CLINICAL INFORMATION/HISTORY: 57 year old female with a history of hidradenitis suppurativa, diabetes, hypertension, and obesity, who reports snoring and excessive daytime somnolence, as well as witnessed apneas per daughter's feedback.  She carries a prior diagnosis of obstructive sleep apnea but has not been on CPAP therapy for years.  She presents for reevaluation.  Epworth sleepiness score: 22/24.  BMI: 32.5 kg/m  FINDINGS:   Sleep Summary:   Total Recording Time (hours, min): 9 hours, 9 min  Total Sleep Time (hours, min):  7 hours, 59 min  Percent REM (%):    Inconclusive REM detection  Respiratory Indices:   Calculated pAHI (per hour):  41/hour         REM pAHI:    N/A       NREM pAHI: 41/hour  Central pAHI: 6/hour  Oxygen Saturation Statistics:    Oxygen Saturation (%) Mean: 90%   Minimum oxygen saturation (%):                 51%   O2 Saturation Range (%): 51-100%    O2 Saturation (minutes) <=88%: 74 min  Pulse Rate Statistics:   Pulse Mean (bpm):    104/min    Pulse Range (76-126/min)   IMPRESSION: OSA (obstructive sleep apnea), severe Nocturnal Hypoxemia Tachycardia  RECOMMENDATION:  This home sleep test demonstrates severe obstructive sleep apnea with a total AHI of 41/hour and O2 nadir of 51% with significant time below or at 88% saturation of over 70 minutes for the night, indicating nocturnal hypoxemia.  Inconclusive REM detection likely leads to an underestimation of her sleep disordered breathing. Fairly consistent moderate snoring was detected throughout the study.  Urgent treatment with positive airway pressure is highly recommended. This will require - ideally - a full night CPAP titration study for proper treatment settings, O2  monitoring and mask fitting. For now, the patient will be advised to proceed with an autoPAP titration/trial at home. A laboratory attended titration study can be considered in the future for optimization of treatment settings and to improve tolerance and compliance. Alternative treatment options are limited secondary to the severity of the patient's sleep disordered breathing, but may include surgical treatment with an implantable hypoglossal nerve stimulator (in carefully selected candidates, meeting criteria).  Concomitant weight loss is recommended, where clinically appropriate. Please note, that untreated obstructive sleep apnea may carry additional perioperative morbidity. Patients with significant obstructive sleep apnea should receive perioperative PAP therapy and the surgeons and particularly the anesthesiologist should be informed of the diagnosis and the severity of the sleep disordered breathing. Tachycardia was due to did with an average heart rate of 104 bpm.  Clinical correlation is recommended and further evaluation with cardiology may be necessary. The patient should be cautioned not to drive, work at heights, or operate dangerous or heavy equipment when tired or sleepy. Review and reiteration of good sleep hygiene measures should be pursued with any patient. Other causes of the patient's symptoms, including circadian rhythm disturbances, an underlying mood disorder, medication effect and/or an underlying medical problem cannot be ruled out based on this test. Clinical correlation is recommended.  The patient and her referring provider will be notified of the test results. The patient will be seen in follow up in sleep clinic at [facility]  I certify that I  have reviewed the raw data recording prior to the issuance of this report in accordance with the standards of the American Academy of Sleep Medicine (AASM).    INTERPRETING PHYSICIAN:   Star Age, MD, PhD Medical Director, [facility]  at [facility] [place], ABPN (Neurology and Sleep)   [facility] [address] [place], [place] [address] [phone]

## 2022-11-13 ENCOUNTER — Other Ambulatory Visit: Payer: Self-pay | Admitting: Medical-Surgical

## 2022-11-15 ENCOUNTER — Telehealth: Payer: Self-pay | Admitting: *Deleted

## 2022-11-15 ENCOUNTER — Encounter: Payer: Self-pay | Admitting: Medical-Surgical

## 2022-11-15 MED ORDER — ZOLPIDEM TARTRATE ER 12.5 MG PO TBCR: 12.5000 mg | EXTENDED_RELEASE_TABLET | Freq: Every evening | ORAL | 0 refills | Status: DC | PRN

## 2022-11-15 MED ORDER — AMBULATORY NON FORMULARY MEDICATION: 0 refills | Status: DC

## 2022-11-15 NOTE — Telephone Encounter (Signed)
-----  Message from Star Age, MD sent at 11/12/2022  2:24 PM EST ----- Patient referred by PCP, seen by me on 08/03/2022, patient had a HST on 11/10/2022.    Please call and notify the patient that the recent home sleep test showed obstructive sleep apnea in the severe range. I highly recommend treatment for this in the form of autoPAP, which means, that we don't have to bring her in for a sleep study with CPAP, but will let her start using a so called autoPAP machine at home, through a DME company (of her choice, or as per insurance requirement). The DME representative will fit the patient with a mask of choice, educate her on how to use the machine, how to put the mask on, etc. I have placed an order in the chart. Please send the order to a local DME, talk to patient, send report to referring MD. Please also reinforce the need for compliance with treatment. We will need a FU in sleep clinic for 10 weeks post-PAP set up, please arrange that with me or one of our NPs. Thanks,   Star Age, MD, PhD [facility])

## 2022-11-15 NOTE — Telephone Encounter (Signed)
I called pt. I advised pt that Dr. Rexene Alberts reviewed their sleep study results and found that pt has severe OSA. Dr. Rexene Alberts recommends that pt start autopap. I reviewed PAP compliance expectations with the pt. Pt is agreeable to starting an auto-PAP. I advised pt that an order will be sent to a DME, [facility], and they will call the pt within about one week after they file with the pt's insurance. They will show the pt how to use the machine, fit for masks, and troubleshoot the auto-PAP if needed. A follow up appt was made for insurance purposes with Debbora Presto NP on 02/07/2023 at 1530. Pt verbalized understanding to arrive 15 minutes early and bring their auto-PAP.  Pt verbalized understanding of results. Pt had no questions at this time but was encouraged to call back if questions arise. I have sent the order to [facility] and have received confirmation that they have received the order.

## 2022-11-15 NOTE — Telephone Encounter (Signed)
-----  Message from Star Age, MD sent at 11/12/2022  2:24 PM EST ----- Patient referred by PCP, seen by me on 08/03/2022, patient had a HST on 11/10/2022.    Please call and notify the patient that the recent home sleep test showed obstructive sleep apnea in the severe range. I highly recommend treatment for this in the form of autoPAP, which means, that we don't have to bring her in for a sleep study with CPAP, but will let her start using a so called autoPAP machine at home, through a DME company (of her choice, or as per insurance requirement). The DME representative will fit the patient with a mask of choice, educate her on how to use the machine, how to put the mask on, etc. I have placed an order in the chart. Please send the order to a local DME, talk to patient, send report to referring MD. Please also reinforce the need for compliance with treatment. We will need a FU in sleep clinic for 10 weeks post-PAP set up, please arrange that with me or one of our NPs. Thanks,   Star Age, MD, PhD [facility])

## 2022-11-15 NOTE — Telephone Encounter (Addendum)
I have called pt and relayed results of her sleep results. Severe OSA.  Start atopap recommended urgently.  Faxed orders to [facility] received confirmation. Also:  Relayed these results as well:    please expedite order for AutoPap therapy.  In addition, please advise patient that she had a fairly high heart rate throughout the night, I would recommend that she follow-up with her primary care to get a formal EKG, she may benefit from a referral to a cardiologist specially if she has symptoms of palpitations.  I do not see in her chart that she has seen a cardiologist before.   Pt verbalized understanding.  Note sent to pcp.

## 2022-11-15 NOTE — Addendum Note (Signed)
Addended bySamuel Bouche on: 11/15/2022 06:21 PM   Modules accepted: Orders

## 2022-11-16 ENCOUNTER — Other Ambulatory Visit: Payer: Self-pay

## 2022-11-16 DIAGNOSIS — G4733 Obstructive sleep apnea (adult) (pediatric): Secondary | ICD-10-CM

## 2022-11-16 MED ORDER — AMBULATORY NON FORMULARY MEDICATION: 0 refills | Status: DC

## 2022-11-21 ENCOUNTER — Other Ambulatory Visit: Payer: Self-pay | Admitting: Podiatry

## 2022-11-22 ENCOUNTER — Ambulatory Visit (INDEPENDENT_AMBULATORY_CARE_PROVIDER_SITE_OTHER): Payer: Managed Care, Other (non HMO) | Admitting: Medical-Surgical

## 2022-11-22 ENCOUNTER — Ambulatory Visit (INDEPENDENT_AMBULATORY_CARE_PROVIDER_SITE_OTHER): Payer: Managed Care, Other (non HMO)

## 2022-11-22 ENCOUNTER — Encounter: Payer: Self-pay | Admitting: Medical-Surgical

## 2022-11-22 VITALS — BP 121/74 | HR 120 | Resp 20 | Ht 67.5 in | Wt 214.6 lb

## 2022-11-22 DIAGNOSIS — R9431 Abnormal electrocardiogram [ECG] [EKG]: Secondary | ICD-10-CM | POA: Diagnosis not present

## 2022-11-22 DIAGNOSIS — R0602 Shortness of breath: Secondary | ICD-10-CM

## 2022-11-22 DIAGNOSIS — R000 Tachycardia, unspecified: Secondary | ICD-10-CM

## 2022-11-22 MED ORDER — METOPROLOL SUCCINATE ER 25 MG PO TB24: 25.0000 mg | ORAL_TABLET | Freq: Every day | ORAL | 3 refills | Status: DC

## 2022-11-22 NOTE — Progress Notes (Signed)
Established Patient Office Visit  Subjective   Patient ID: Helen Perez, female   DOB: 1966/04/17 Age: 57 y.o. MRN: [id number]   Chief Complaint  Patient presents with   Follow-up   sleep study    HPI Pleasant 57 year old female presenting today at the request of the sleep center after completing her sleep study.  Report noted that she was tachycardic and requested to see her PCP for an EKG.  Currently asymptomatic with no episodes of heart racing or palpitations.  She has had some intermittent headaches, dizziness, and shortness of breath over the last couple of weeks.  Has also developed a strange cough but feels this is related to something that seems to be stuck in the back of her throat.  Previously referred to cardiology but never got in touch with them and completed an appointment to establish.  No previous echocardiogram.  Was unable to see her sleep study report in the chart.  Interested in discussing the sleep study findings.  Has an appointment with [facility] to get her CPAP on 12/01/2022.   Objective:    Vitals:   11/22/22 1604  BP: 121/74  Pulse: (!) 120  Resp: 20  Height: 5' 7.5" (1.715 m)  Weight: 214 lb 9.6 oz (97.3 kg)  SpO2: 96%  BMI (Calculated): 33.1    Physical Exam Vitals and nursing note reviewed.  Constitutional:      General: She is not in acute distress.    Appearance: Normal appearance. She is obese. She is not ill-appearing.  HENT:     Head: Normocephalic and atraumatic.  Cardiovascular:     Rate and Rhythm: Normal rate and regular rhythm.     Pulses: Normal pulses.     Heart sounds: Normal heart sounds.  Pulmonary:     Effort: Pulmonary effort is normal. No respiratory distress.     Breath sounds: Normal breath sounds. No wheezing, rhonchi or rales.  Skin:    General: Skin is warm and dry.  Neurological:     Mental Status: She is alert and oriented to person, place, and time.  Psychiatric:        Mood and Affect: Mood normal.         Behavior: Behavior normal.        Thought Content: Thought content normal.        Judgment: Judgment normal.   No results found for this or any previous visit (from the past 24 hour(s)).     The ASCVD Risk score (Arnett DK, et al., 2019) failed to calculate for the following reasons:   The valid total cholesterol range is 130 to 320 mg/dL   Assessment & Plan:   1. Shortness of breath Getting chest x-ray today.  Broad differential to include asthma, CHF, and hypoventilation. Reduced activity tolerance possibly related to sedentary lifestyle. - DG Chest 2 View; Future  2. Abnormal EKG Chest x ray as above. In office EKG showing sinus tachycardia, normal axis, rate 117, possible left atrial enlargement. Ordering echocardiogram.  - ECHOCARDIOGRAM COMPLETE; Future - DG Chest 2 View; Future  3. Tachycardia On review of chart, has been tachycardic since at least 2018. EKG as above. Checking TSH, normal in previous years. Plan for an echocardiogram to evaluate for structure issues. In the meantime, adding Toprol-XL '25mg'$  daily.   - EKG 12-Lead - TSH - ECHOCARDIOGRAM COMPLETE; Future - DG Chest 2 View; Future  Return if symptoms worsen or fail to improve. Further follow up pending lab and  echo results.   ___________________________________________ Clearnce Sorrel, DNP, APRN, FNP-BC [facility] and [facility] [place]

## 2022-11-23 LAB — TSH: TSH: 0.69 mIU/L (ref 0.40–4.50)

## 2022-12-07 ENCOUNTER — Encounter (HOSPITAL_COMMUNITY): Payer: Self-pay

## 2022-12-10 ENCOUNTER — Ambulatory Visit (HOSPITAL_COMMUNITY): Payer: Managed Care, Other (non HMO) | Attending: Medical-Surgical

## 2022-12-10 DIAGNOSIS — R9431 Abnormal electrocardiogram [ECG] [EKG]: Secondary | ICD-10-CM | POA: Diagnosis present

## 2022-12-10 DIAGNOSIS — R000 Tachycardia, unspecified: Secondary | ICD-10-CM | POA: Insufficient documentation

## 2022-12-10 LAB — ECHOCARDIOGRAM COMPLETE
Area-P 1/2: 5.31 cm2
S' Lateral: 2.4 cm

## 2022-12-14 ENCOUNTER — Other Ambulatory Visit: Payer: Self-pay | Admitting: Medical-Surgical

## 2022-12-14 DIAGNOSIS — R000 Tachycardia, unspecified: Secondary | ICD-10-CM

## 2022-12-14 DIAGNOSIS — R0602 Shortness of breath: Secondary | ICD-10-CM

## 2022-12-14 DIAGNOSIS — I517 Cardiomegaly: Secondary | ICD-10-CM

## 2022-12-14 DIAGNOSIS — R9431 Abnormal electrocardiogram [ECG] [EKG]: Secondary | ICD-10-CM

## 2022-12-22 ENCOUNTER — Other Ambulatory Visit: Payer: Self-pay | Admitting: Physician Assistant

## 2022-12-23 ENCOUNTER — Other Ambulatory Visit: Payer: Self-pay | Admitting: Physician Assistant

## 2022-12-23 ENCOUNTER — Other Ambulatory Visit: Payer: Self-pay | Admitting: Medical-Surgical

## 2022-12-24 ENCOUNTER — Ambulatory Visit (INDEPENDENT_AMBULATORY_CARE_PROVIDER_SITE_OTHER): Payer: Managed Care, Other (non HMO) | Admitting: Medical-Surgical

## 2022-12-24 ENCOUNTER — Encounter: Payer: Self-pay | Admitting: Medical-Surgical

## 2022-12-24 VITALS — BP 122/74 | HR 109 | Resp 20 | Ht 67.5 in | Wt 215.7 lb

## 2022-12-24 DIAGNOSIS — Z1231 Encounter for screening mammogram for malignant neoplasm of breast: Secondary | ICD-10-CM | POA: Diagnosis not present

## 2022-12-24 DIAGNOSIS — R519 Headache, unspecified: Secondary | ICD-10-CM

## 2022-12-24 DIAGNOSIS — K5902 Outlet dysfunction constipation: Secondary | ICD-10-CM | POA: Diagnosis not present

## 2022-12-24 DIAGNOSIS — G4700 Insomnia, unspecified: Secondary | ICD-10-CM

## 2022-12-24 DIAGNOSIS — R142 Eructation: Secondary | ICD-10-CM

## 2022-12-24 DIAGNOSIS — R143 Flatulence: Secondary | ICD-10-CM

## 2022-12-24 DIAGNOSIS — R000 Tachycardia, unspecified: Secondary | ICD-10-CM

## 2022-12-24 DIAGNOSIS — R141 Gas pain: Secondary | ICD-10-CM

## 2022-12-24 MED ORDER — DEXCOM G6 SENSOR MISC: 3 refills | Status: DC

## 2022-12-24 MED ORDER — ZOLPIDEM TARTRATE ER 12.5 MG PO TBCR: EXTENDED_RELEASE_TABLET | ORAL | 5 refills | Status: DC

## 2022-12-24 MED ORDER — LUBIPROSTONE 24 MCG PO CAPS: 24.0000 ug | ORAL_CAPSULE | Freq: Two times a day (BID) | ORAL | 2 refills | Status: DC

## 2022-12-24 MED ORDER — RIZATRIPTAN BENZOATE 10 MG PO TBDP: 10.0000 mg | ORAL_TABLET | ORAL | 3 refills | Status: DC | PRN

## 2022-12-24 MED ORDER — ATORVASTATIN CALCIUM 40 MG PO TABS: 40.0000 mg | ORAL_TABLET | Freq: Every day | ORAL | 0 refills | Status: DC

## 2022-12-24 MED ORDER — LEVOCETIRIZINE DIHYDROCHLORIDE 5 MG PO TABS: 5.0000 mg | ORAL_TABLET | Freq: Every evening | ORAL | 1 refills | Status: DC

## 2022-12-24 MED ORDER — VALACYCLOVIR HCL 500 MG PO TABS: 500.0000 mg | ORAL_TABLET | Freq: Every day | ORAL | 0 refills | Status: DC

## 2022-12-24 NOTE — Progress Notes (Signed)
   Established Patient Office Visit  Subjective   Patient ID: Johnda Billiot, female   DOB: 1965/11/12 Age: 57 y.o. MRN: [id number]   Chief Complaint  Patient presents with   Headache   HPI Pleasant 57 year old female presenting today for discussion of headache that has been going on for several weeks.  Headaches Onset: 3 weeks ago Location: right eye and head Duration: constant with flares in the evening Frequency: daily Characters: dull throbbing Alleviating factors: tylenol, some help Aggravating factors: increased pressure Associated symptoms: lightheaded Neuro changes: none Vision changes: none Family history of Migraine: yes Red Flags      Fever: no      Neck pain/stiffness: right neck pain      Vision/speech difficulty: none      Focal weakness or numbness: none      Altered mental status: none      Trauma: none      Worse in am: none      Anticoagulant use: none      Immunocompromise: no    Objective:    Vitals:   12/24/22 1002  BP: 122/74  Pulse: (!) 109  Resp: 20  Height: 5' 7.5" (1.715 m)  Weight: 215 lb 11.2 oz (97.8 kg)  SpO2: 100%  BMI (Calculated): 33.27   Physical Exam  No results found for this or any previous visit (from the past 24 hour(s)).     The ASCVD Risk score (Arnett DK, et al., 2019) failed to calculate for the following reasons:   The valid total cholesterol range is 130 to 320 mg/dL   Assessment & Plan:   1. Insomnia, unspecified type Well-managed.  Continue Ambien 12.5 mg nightly.  Discussed that we will likely need to reduce her dose as she ages for safe prescribing.  Patient verbalized understanding. - zolpidem (AMBIEN CR) 12.5 MG CR tablet; TAKE ONE TABLET BY MOUTH ONE TIME DAILY AT BEDTIME AS NEEDED FOR SLEEP  Dispense: 30 tablet; Refill: 5  2. Encounter for screening mammogram for malignant neoplasm of breast Mammogram ordered. - MM DIGITAL SCREENING BILATERAL; Future  3. Acute intractable headache, unspecified  headache type Consider migraine versus cervicogenic headache versus sinus headache.  Adding Xyzal 5 mg nightly.  Also adding Maxalt 10 mg daily as needed with okay to repeat dose in 2 hours if headache is not resolved.  Advised that 2 tablets in a 24-hour period is the maximum dose.  4. Constipation due to outlet dysfunction Discontinue Trulance as it is ineffective.  Starting Amitiza 24 mcg twice daily.  5. Sinus tachycardia Upcoming appointment with cardiology for further investigation.  Reviewed echocardiogram results during our appointment.  6. Flatulence, eructation and gas pain Recommend starting Gas-X up to 4 times daily as needed.  Discussed avoiding high FODMAP foods.  Return if symptoms worsen or fail to improve.  ___________________________________________ Clearnce Sorrel, DNP, APRN, FNP-BC Primary Care and [place]

## 2023-01-03 ENCOUNTER — Ambulatory Visit (INDEPENDENT_AMBULATORY_CARE_PROVIDER_SITE_OTHER): Payer: Managed Care, Other (non HMO) | Admitting: Podiatry

## 2023-01-03 DIAGNOSIS — M2042 Other hammer toe(s) (acquired), left foot: Secondary | ICD-10-CM

## 2023-01-03 DIAGNOSIS — M19072 Primary osteoarthritis, left ankle and foot: Secondary | ICD-10-CM

## 2023-01-03 DIAGNOSIS — E1149 Type 2 diabetes mellitus with other diabetic neurological complication: Secondary | ICD-10-CM

## 2023-01-03 DIAGNOSIS — E119 Type 2 diabetes mellitus without complications: Secondary | ICD-10-CM | POA: Diagnosis not present

## 2023-01-03 MED ORDER — CICLOPIROX 8 % EX SOLN: Freq: Every day | CUTANEOUS | 0 refills | Status: DC

## 2023-01-05 NOTE — Progress Notes (Signed)
Subjective: Chief Complaint  Patient presents with   Hammer Toe    Hammer toe, left foot, follow up, patient stated she is doing well     57 year old female presents the office today with above concerns she said that she is doing well not having any significant pain.  She has not seen any open sores on her feet.  No increase in swelling that she reports.  She does not report any injuries.  No other concerns.  Objective: AAO x3, NAD DP/PT pulses palpable bilaterally, CRT less than 3 seconds Sensation decreased. Procedure site the hallux has healed.  There is no open lesions noted bilaterally.  Hammertoes are present bilaterally.  There is no significant pain particularly noted on the left midfoot.  No area pinpoint tenderness.  MMT 5/5. No pain with calf compression, swelling, warmth, erythema  Assessment: 57 year old female with hammertoes bilaterally.  Plan: -All treatment options discussed with the patient including all alternatives, risks, complications.  -Overall she seems to be doing well.  No significant pain.  Continue shoes and good arch support.  Procedure site is healed and there is no open lesions.  Discussed daily foot inspection, glucose control.  Return in about 3 months (around 04/05/2023).  Trula Slade DPM

## 2023-01-17 ENCOUNTER — Other Ambulatory Visit: Payer: Self-pay

## 2023-01-17 ENCOUNTER — Telehealth: Payer: Self-pay | Admitting: Neurology

## 2023-01-17 ENCOUNTER — Encounter: Payer: Self-pay | Admitting: Internal Medicine

## 2023-01-17 ENCOUNTER — Ambulatory Visit: Payer: Managed Care, Other (non HMO) | Admitting: Internal Medicine

## 2023-01-17 VITALS — BP 126/80 | HR 80 | Ht 67.5 in | Wt 215.0 lb

## 2023-01-17 DIAGNOSIS — E1142 Type 2 diabetes mellitus with diabetic polyneuropathy: Secondary | ICD-10-CM

## 2023-01-17 DIAGNOSIS — E1159 Type 2 diabetes mellitus with other circulatory complications: Secondary | ICD-10-CM | POA: Diagnosis not present

## 2023-01-17 DIAGNOSIS — E1165 Type 2 diabetes mellitus with hyperglycemia: Secondary | ICD-10-CM | POA: Diagnosis not present

## 2023-01-17 DIAGNOSIS — Z794 Long term (current) use of insulin: Secondary | ICD-10-CM

## 2023-01-17 LAB — POCT GLYCOSYLATED HEMOGLOBIN (HGB A1C): Hemoglobin A1C: 10.5 % — AB (ref 4.0–5.6)

## 2023-01-17 MED ORDER — TIRZEPATIDE 5 MG/0.5ML ~~LOC~~ SOAJ
5.0000 mg | SUBCUTANEOUS | 3 refills | Status: DC
  Filled 2023-01-17: qty 2, 28d supply, fill #0
  Filled 2023-02-13: qty 2, 28d supply, fill #1

## 2023-01-17 NOTE — Progress Notes (Signed)
Name: Helen Perez  Age/ Sex: 57 y.o., female   MRN/ DOB: [id number], 1966-08-05     PCP: Samuel Bouche, NP   Reason for Endocrinology Evaluation: Type 2 Diabetes Mellitus  Initial Endocrine Consultative Visit: 01/30/2020    PATIENT IDENTIFIER: Helen Perez is a 57 y.o. female with a past medical history of HTN, TIA, OSA and T2DM. The patient has followed with Endocrinology clinic since 01/30/2020 for consultative assistance with management of her diabetes.  DIABETIC HISTORY:  Helen Perez was diagnosed with T2DM in 2000. Helen Perez reports intolerance to Metformin.  Helen Perez has been on an insulin pump for years.  Her hemoglobin A1c has ranged from 10.1% in 12/10/2019, peaking at 15.7% in 2016.  On her initial visit to our clinic her A1c was 12.5% , Helen Perez was on Ozempic and novolog through insulin pump. We did not make any changes as Helen Perez  Uses the pump only periodically as well as the Decox. Chronic hx of non-compliance, and was given the option to switch to MDI vs using the pump properly , Helen Perez opted to continue with the pump at the time.     By 04/2020 we stopped insulin pump and started MDI regimen   Farxiga started 10/2020 and stopped by 02/2021 due to worsening genital infections  We switched Ozempic to [facility] 08/2022    SUBJECTIVE:   During the last visit (09/06/2022): A1c 10.5% .       Today (01/17/2023): Helen Perez is here for a follow up on diabetes  Helen Perez checks her blood sugars multiple  times daily, through the dexcom. The patient has not had hypoglycemic episodes since the last clinic visit.  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, S/P left foot sx 11/2021, last seen 01/03/2023 Helen Perez had a follow-up with neurology for OSA 11/09/2022  Helen Perez has not been on Mounjaro  for 3 months  due to shortage   Denies nausea, vomiting for  diarrhea   Schedule with cardiology next week  for exertional dyspnea  No chest pain   HOME DIABETES REGIMEN:  - Toujeo 38 units once daily  -  Novolog 20 units TIDQAC - not taking -Mounjaro 5 mg weekly- not taking  - Novolog (BG-130/20)     Statin: Yes ACE-I/ARB: yes     CONTINUOUS GLUCOSE MONITORING RECORD INTERPRETATION    Dates of Recording: 3/19-01/17/223   Sensor description:Dexcom  Results statistics:   CGM use % of time 93 %  Average and SD 274/96  Time in range 18  % Time Above 180 23  % Time above 250 58  % Time Below target <1       Glycemic patterns summary: BG's trend down overnight and increased throughout the day Hyperglycemic episodes postprandial  Hypoglycemic episodes occurred following a bolus   Overnight periods trend down    INpen Report :no updated report      DIABETIC COMPLICATIONS: Microvascular complications:  Neuropathy Denies: retinopathy,  CKD Last eye exam: Completed 2021   Macrovascular complications:  CVA Denies: CAD, PVD    HISTORY:  Past Medical History:  Past Medical History:  Diagnosis Date   Anxiety    Depression    Diabetes mellitus without complication    H/O syphilis    HSV-2 infection    Hypertension associated with diabetes 03/30/2018   Renal insufficiency 10/13/2018   Sleep apnea    CPAP   Stroke    Past Surgical History:  Past Surgical History:  Procedure Laterality Date   ABDOMINAL  HYSTERECTOMY     FOOT SURGERY Right    Social History:  reports that Helen Perez quit smoking about 10 years ago. Her smoking use included cigarettes. Helen Perez has never used smokeless tobacco. Helen Perez reports current alcohol use. Helen Perez reports that Helen Perez does not use drugs. Family History:  Family History  Problem Relation Age of Onset   Diabetes Mother    Heart disease Mother    Heart failure Mother    Kidney failure Father    Diabetes Sister    Kidney failure Sister    Sleep apnea Sister    Other Brother        quadraplegia   Diabetes Brother    Kidney failure Brother    Sleep apnea Brother    Diabetes Brother    Diabetes Maternal Grandmother    Colon cancer Neg  Hx    Esophageal cancer Neg Hx    Pancreatic cancer Neg Hx    Stomach cancer Neg Hx    Liver disease Neg Hx      HOME MEDICATIONS: Allergies as of 01/17/2023       Reactions   Adhesive [tape]    From dexcom   Latex Itching, Swelling   Other Dermatitis   FREESTYLE LIBRE SENSOR- cellulitis, wound on skin         Medication List        Accurate as of January 17, 2023  3:05 PM. If you have any questions, ask your nurse or doctor.          acetaminophen 650 MG CR tablet Commonly known as: TYLENOL Take 1 tablet by mouth as needed.   acyclovir ointment 5 % Commonly known as: ZOVIRAX Apply topically.   AMBULATORY NON FORMULARY MEDICATION Continuous positive airway pressure (CPAP) machine set on AutoPAP (4-20 cmH2O), with all supplemental supplies as needed.   amitriptyline 50 MG tablet Commonly known as: ELAVIL Take 1 tablet (50 mg total) by mouth at bedtime.   aspirin EC 81 MG tablet Take 1 tablet by mouth daily.   atorvastatin 40 MG tablet Commonly known as: LIPITOR Take 1 tablet (40 mg total) by mouth daily.   buPROPion HCl ER (XL) 450 MG Tb24 Take 450 mg by mouth daily.   ciclopirox 8 % solution Commonly known as: PENLAC Apply topically at bedtime. Apply over nail and surrounding skin. Apply daily over previous coat. After seven (7) days, may remove with alcohol and continue cycle.   clindamycin 1 % Swab Commonly known as: CLEOCIN T Apply 1 Application topically 2 (two) times daily. Apply to the affected areas in the axilla and groin.   clotrimazole-betamethasone cream Commonly known as: LOTRISONE APPLY TO THE AFFECTED AREA(S) 2 TIMES A DAY   Dexcom G6 Receiver Devi USE AS INSTRUCTED TO CHECK BLOOD SUGAR DAILY   Dexcom G6 Sensor Misc INSERT 1 DEVICE INTO THE SKIN EVERY 10 DAYS   Dexcom G6 Transmitter Misc USE 1 EVERY 3 MONTHS AS DIRECTED   doxycycline 100 MG capsule Commonly known as: VIBRAMYCIN Take 100 mg by mouth daily.    fexofenadine-pseudoephedrine 180-240 MG 24 hr tablet Commonly known as: ALLEGRA-D 24 Take 1 tablet by mouth daily as needed.   furosemide 20 MG tablet Commonly known as: LASIX TAKE 1 TABLET BY MOUTH DAILY. IF SIGNIFICANT SWELLING THAT IS NOT RESPONDING TO ELEVATION AND COMPRESSION, TAKE 1 EXTRA TABLET BY MOUTH NO MORE THAN 3 TIMES WEEKLY.   InPen 100-Blue-Novolog-Fiasp Devi Generic drug: injection device for insulin USE 1 DEVICE BY OTHER ROUTE ONCE  FOR 1 DOSE   Insulin Pen Needle 31G X 5 MM Misc Use to inject Novolog insulin up to 4 times daily and Toujeo insulin once daily.   levocetirizine 5 MG tablet Commonly known as: XYZAL Take 1 tablet (5 mg total) by mouth every evening.   lubiprostone 24 MCG capsule Commonly known as: AMITIZA Take 1 capsule (24 mcg total) by mouth 2 (two) times daily with a meal.   metoprolol succinate 25 MG 24 hr tablet Commonly known as: TOPROL-XL Take 1 tablet (25 mg total) by mouth daily.   mupirocin ointment 2 % Commonly known as: BACTROBAN Apply 1 application topically. 2-3 times daily   mupirocin ointment 2 % Commonly known as: BACTROBAN Apply 1 Application topically 2 (two) times daily.   NONFORMULARY OR COMPOUNDED ITEM Antifungal solution: Terbinafine 3%, Fluconazole 2%, Tea Tree Oil 5%, Urea 10%, Ibuprofen 2% in DMSO suspension #48mL   NovoLOG PenFill cartridge Generic drug: insulin aspart MAX DAILY DOSE 80 UNITS   rizatriptan 10 MG disintegrating tablet Commonly known as: MAXALT-MLT Take 1 tablet (10 mg total) by mouth as needed for migraine. May repeat in 2 hours if needed   senna-docusate 8.6-50 MG tablet Commonly known as: Senokot-S Take by mouth.   Skin Tac Adhesive Barrier Wipe Misc 1 applicator by Does not apply route as directed.   spironolactone 100 MG tablet Commonly known as: ALDACTONE Take 100 mg by mouth daily.   tirzepatide 5 MG/0.5ML Pen Commonly known as: MOUNJARO Inject 5 mg into the skin once a week.    Toujeo Max SoloStar 300 UNIT/ML Solostar Pen Generic drug: insulin glargine (2 Unit Dial) Inject 38 Units into the skin daily at 6 (six) AM.   triamcinolone cream 0.5 % Commonly known as: KENALOG Apply 1 application topically 2 (two) times daily. To affected areas.   valACYclovir 500 MG tablet Commonly known as: VALTREX Take 1 tablet (500 mg total) by mouth daily.   Vitamin D (Ergocalciferol) 1.25 MG (50000 UNIT) Caps capsule Commonly known as: DRISDOL TAKE 1 CAPSULE BY MOUTH EVERY 7 DAYS   zolpidem 12.5 MG CR tablet Commonly known as: AMBIEN CR TAKE ONE TABLET BY MOUTH ONE TIME DAILY AT BEDTIME AS NEEDED FOR SLEEP         OBJECTIVE:   Vital Signs: BP 126/80 (BP Location: Left Arm, Patient Position: Sitting, Cuff Size: Large)   Pulse 80   Ht 5' 7.5" (1.715 m)   Wt 215 lb (97.5 kg)   SpO2 96%   BMI 33.18 kg/m   Wt Readings from Last 3 Encounters:  01/17/23 215 lb (97.5 kg)  12/24/22 215 lb 11.2 oz (97.8 kg)  11/22/22 214 lb 9.6 oz (97.3 kg)     Exam: General: Pt appears well and is in NAD  Lungs: Clear with good BS bilat  Heart: RRR   Extremities: No pretibial edema.    Neuro: MS is good with appropriate affect, pt is alert and Ox3     DATA REVIEWED:  Lab Results  Component Value Date   HGBA1C 10.5 (A) 01/17/2023   HGBA1C 10.6 (A) 09/06/2022   HGBA1C 8.0 (A) 03/03/2022   Lab Results  Component Value Date   MICROALBUR 150mg  05/26/2021   LDLCALC 58 02/04/2021   CREATININE 1.07 (H) 01/18/2022   Lab Results  Component Value Date   MICRALBCREAT 30-300mg /g 05/26/2021     Lab Results  Component Value Date   CHOL 129 02/04/2021   HDL 52 02/04/2021   LDLCALC 58 02/04/2021  TRIG 108 02/04/2021   CHOLHDL 2.5 02/04/2021         ASSESSMENT / PLAN / RECOMMENDATIONS:   1) Type 2 Diabetes Mellitus, poorly controlled , With neuropathic and macrovascular  complications - Most recent A1c of 10.5 %. Goal A1c < 7.0 %.     -Helen Perez continues with poorly  controlled diabetes, Helen Perez continues to ride on her basal insulin only, Helen Perez continues not to take her prandial dose of insulin -We again emphasized the importance of taking prandial insulin before each meal -Helen Perez also continues with intermittent medication intake, Helen Perez has not been able to take [facility] for 3 months, Helen Perez did not contact the office to alert [place] so we could see if we can switch it back to Ozempic -I printed prescription for [facility], and asked her to go to the [facility] health community clinic downstairs -I will increase her basal insulin as below -I did recommend trying her on the tandem pump, the OmniPod will be costly as Helen Perez will have to change the pod every day but I believe the tandem pump may be a better option for her -Helen Perez was provided with contact information to start the paper work to tandem -We have not been able to download her Inpen  for a while   MEDICATIONS: -Increase Toujeo 42 units once daily  - Take Novolog to 20 units with Breakfast, 20 units with lunch and 20 units with Supper  -Restart Mounjaro 5 mg weekly -Correction factor : Novolog (BG-130/20)   EDUCATION / INSTRUCTIONS: BG monitoring instructions: Patient is instructed to check her blood sugars 4 times a day, before meals and bedtime . Call [facility] if: BG persistently < 70  I reviewed the Rule of 15 for the treatment of hypoglycemia in detail with the patient. Literature supplied.    2) Diabetic complications:  Eye: Does not have known diabetic retinopathy.  Neuro/ Feet: Does have known diabetic peripheral neuropathy .  Renal: Patient does not have known baseline CKD. Helen Perez   is  on an ACEI/ARB at present.      F/U in 3 months    Signed electronically by: Mack Guise, MD  [facility]  [facility] [address] [place], [place] [address] Phone: [phone] FAX: [phone]   CC: Samuel Bouche, [place] [address] [place] [address] Phone: [phone]  Fax: [phone]  Return to Endocrinology clinic as below: Future Appointments  Date Time Provider [place]  01/26/2023  1:40 PM Park Liter, MD [facility] None  02/07/2023  3:30 PM Debbora Presto, NP [facility] None  04/07/2023  3:15 PM Trula Slade, DPM [facility]  04/18/2023  1:40 PM Samuel Bouche, NP [facility] None

## 2023-01-17 NOTE — Telephone Encounter (Signed)
LVM and sent mychart msg advising pt of appt change.

## 2023-01-17 NOTE — Patient Instructions (Addendum)
Please contact Tandem pump at [phone] to start the application process for a new pump   - Restart  Mounjaro 5 mg weekly  - Increase  Toujeo  42 units daily  - Take  Novolog 20 units with Breakfast and 20 units Lunch and 20 units with Supper - Novolog correctional insulin: ADD extra units on insulin to your meal-time Novolog dose if your blood sugars are higher than 150. Use the scale below to help guide you:   Blood sugar before meal Number of units to inject  Less than 150 0 unit  151 -  170 1 units  171 -  185 2 units  186 -  205 3 units  206 -  225 4 units  226 -  245 5 units  246 -  266 6 units  267 -  286 7 units  287 -  306 8 units  306 - 326 9 units       HOW TO TREAT LOW BLOOD SUGARS (Blood sugar LESS THAN 70 MG/DL) Please follow the RULE OF 15 for the treatment of hypoglycemia treatment (when your (blood sugars are less than 70 mg/dL)   STEP 1: Take 15 grams of carbohydrates when your blood sugar is low, which includes:  3-4 GLUCOSE TABS  OR 3-4 OZ OF JUICE OR REGULAR SODA OR ONE TUBE OF GLUCOSE GEL    STEP 2: RECHECK blood sugar in 15 MINUTES STEP 3: If your blood sugar is still low at the 15 minute recheck --> then, go back to STEP 1 and treat AGAIN with another 15 grams of carbohydrates.

## 2023-01-18 ENCOUNTER — Other Ambulatory Visit: Payer: Self-pay

## 2023-01-24 ENCOUNTER — Other Ambulatory Visit: Payer: Self-pay

## 2023-01-24 ENCOUNTER — Encounter: Payer: Self-pay | Admitting: Internal Medicine

## 2023-01-26 ENCOUNTER — Encounter: Payer: Self-pay | Admitting: Cardiology

## 2023-01-26 ENCOUNTER — Ambulatory Visit: Payer: Managed Care, Other (non HMO) | Attending: Cardiology | Admitting: Cardiology

## 2023-01-26 VITALS — BP 116/70 | HR 106 | Ht 67.5 in | Wt 224.0 lb

## 2023-01-26 DIAGNOSIS — I479 Paroxysmal tachycardia, unspecified: Secondary | ICD-10-CM

## 2023-01-26 DIAGNOSIS — G4733 Obstructive sleep apnea (adult) (pediatric): Secondary | ICD-10-CM

## 2023-01-26 DIAGNOSIS — E1142 Type 2 diabetes mellitus with diabetic polyneuropathy: Secondary | ICD-10-CM | POA: Diagnosis not present

## 2023-01-26 DIAGNOSIS — R0609 Other forms of dyspnea: Secondary | ICD-10-CM

## 2023-01-26 DIAGNOSIS — Z794 Long term (current) use of insulin: Secondary | ICD-10-CM

## 2023-01-26 NOTE — Patient Instructions (Signed)
Medication Instructions:  Your physician recommends that you continue on your current medications as directed. Please refer to the Current Medication list given to you today.  *If you need a refill on your cardiac medications before your next appointment, please call your pharmacy*   Lab Work: None Ordered If you have labs (blood work) drawn today and your tests are completely normal, you will receive your results only by: MyChart Message (if you have MyChart) OR A paper copy in the mail If you have any lab test that is abnormal or we need to change your treatment, we will call you to review the results.   Testing/Procedures:  WHY IS MY DOCTOR PRESCRIBING ZIO? The Zio system is proven and trusted by physicians to detect and diagnose irregular heart rhythms -- and has been prescribed to hundreds of thousands of patients.  The FDA has cleared the Zio system to monitor for many different kinds of irregular heart rhythms. In a study, physicians were able to reach a diagnosis 90% of the time with the Zio system1.  You can wear the Zio monitor -- a small, discreet, comfortable patch -- during your normal day-to-day activity, including while you sleep, shower, and exercise, while it records every single heartbeat for analysis.  1Barrett, P., et al. Comparison of 24 Hour Holter Monitoring Versus 14 Day Novel Adhesive Patch Electrocardiographic Monitoring. American Journal of Medicine, 2014.  ZIO VS. HOLTER MONITORING The Zio monitor can be comfortably worn for up to 14 days. Holter monitors can be worn for 24 to 48 hours, limiting the time to record any irregular heart rhythms you may have. Zio is able to capture data for the 51% of patients who have their first symptom-triggered arrhythmia after 48 hours.1  LIVE WITHOUT RESTRICTIONS The Zio ambulatory cardiac monitor is a small, unobtrusive, and water-resistant patch--you might even forget you're wearing it. The Zio monitor records and stores  every beat of your heart, whether you're sleeping, working out, or showering.    Your physician has requested that you have a lexiscan myoview. For further information please visit [email or web address] Please follow instruction sheet, as given.  The test will take approximately 3 to 4 hours to complete; you may bring reading material.  If someone comes with you to your appointment, they will need to remain in the main lobby due to limited space in the testing area.   How to prepare for your Myocardial Perfusion Test: Do not eat or drink 3 hours prior to your test, except you may have water. Do not consume products containing caffeine (regular or decaffeinated) 12 hours prior to your test. (ex: coffee, chocolate, sodas, tea). Do bring a list of your current medications with you.  If not listed below, you may take your medications as normal. Do wear comfortable clothes (no dresses or overalls) and walking shoes, tennis shoes preferred (No heels or open toe shoes are allowed). Do NOT wear cologne, perfume, aftershave, or lotions (deodorant is allowed). If these instructions are not followed, your test will have to be rescheduled.     Follow-Up: At [facility], you and your health needs are our priority.  As part of our continuing mission to provide you with exceptional heart care, we have created designated Provider Care Teams.  These Care Teams include your primary Cardiologist (physician) and Advanced Practice Providers (APPs -  Physician Assistants and Nurse Practitioners) who all work together to provide you with the care you need, when you need it.  We recommend signing up  for the patient portal called "MyChart".  Sign up information is provided on this After Visit Summary.  MyChart is used to connect with patients for Virtual Visits (Telemedicine).  Patients are able to view lab/test results, encounter notes, upcoming appointments, etc.  Non-urgent messages can be sent to your provider as  well.   To learn more about what you can do with MyChart, go to [email or web address].    Your next appointment:   3 month(s)  The format for your next appointment:   In Person  Provider:   Gypsy Balsam, MD    Other Instructions NA

## 2023-01-26 NOTE — Progress Notes (Unsigned)
Cardiology Consultation:    Date:  01/26/2023   ID:  Helen Perez, DOB [birth year], MRN [id number]  PCP:  Christen ButterJessup, Joy, NP  Cardiologist:  Gypsy Balsamobert Maurianna Benard, MD   Referring MD: Christen ButterJessup, Joy, NP   Chief Complaint  Patient presents with   cardiac atrophy    L side found on ECG per PCP    History of Present Illness:    Helen Perez is a 57 y.o. female who is being seen today for the evaluation of abnormal echocardiogram at the request of Christen ButterJessup, Joy, NP.  Past medical history significant for longstanding diabetes which is poorly controlled, she does have remote history of smoking but she quit about 10 to 15 years ago, dyslipidemia, obstructive sleep apnea which being listened to recognized and treated.  She was referred to us because she had been complaining of functional shortness of breath even simple shopping in the store will give her shortness of breath.  She ended up having echocardiogram done echocardiogram showed borderline left ventricle hypertrophy with interventricular septum and posterior wall thickness of 11 mm, there was also questionable PFO.  She does have remote history of CVA that happened many years ago.  Since that time she seems to be doing fine.  She does not exercise on the regular basis she is not on any special diet.  She noticed to have elevated heart rate and this is also one of the reason for referral she did see Dr. Beverely Paceheek many years ago for similar complaint meaning tachycardia.  That evaluation recommendation was to do stress test, she never had it done.  Past Medical History:  Diagnosis Date   Anxiety    Depression    Diabetes mellitus without complication    H/O syphilis    HSV-2 infection    Hypertension associated with diabetes 03/30/2018   Renal insufficiency 10/13/2018   Sleep apnea    CPAP   Stroke     Past Surgical History:  Procedure Laterality Date   ABDOMINAL HYSTERECTOMY     FOOT SURGERY Right    FOOT SURGERY Left     Current  Medications: Current Meds  Medication Sig   acetaminophen (TYLENOL) 650 MG CR tablet Take 1 tablet by mouth as needed for pain or fever.   acyclovir ointment (ZOVIRAX) 5 % Apply 1 Application topically every 3 (three) hours.   AMBULATORY NON FORMULARY MEDICATION Continuous positive airway pressure (CPAP) machine set on AutoPAP (4-20 cmH2O), with all supplemental supplies as needed. (Patient taking differently: 1 each by Other route See admin instructions. Continuous positive airway pressure (CPAP) machine set on AutoPAP (4-20 cmH2O), with all supplemental supplies as needed.)   amitriptyline (ELAVIL) 50 MG tablet Take 1 tablet (50 mg total) by mouth at bedtime.   aspirin EC 81 MG tablet Take 1 tablet by mouth daily.   atorvastatin (LIPITOR) 40 MG tablet Take 1 tablet (40 mg total) by mouth daily.   buPROPion HCl ER, XL, 450 MG TB24 Take 450 mg by mouth daily.   ciclopirox (PENLAC) 8 % solution Apply topically at bedtime. Apply over nail and surrounding skin. Apply daily over previous coat. After seven (7) days, may remove with alcohol and continue cycle. (Patient taking differently: Apply 1 application  topically at bedtime. Apply over nail and surrounding skin. Apply daily over previous coat. After seven (7) days, may remove with alcohol and continue cycle.)   clindamycin (CLEOCIN T) 1 % SWAB Apply 1 Application topically 2 (two) times daily. Apply to the affected  areas in the axilla and groin.   clotrimazole-betamethasone (LOTRISONE) cream APPLY TO THE AFFECTED AREA(S) 2 TIMES A DAY (Patient taking differently: Apply 1 Application topically 2 (two) times daily.)   Continuous Blood Gluc Receiver (DEXCOM G6 RECEIVER) DEVI USE AS INSTRUCTED TO CHECK BLOOD SUGAR DAILY (Patient taking differently: 1 each by Other route See admin instructions. USE AS INSTRUCTED TO CHECK BLOOD SUGAR DAILY)   Continuous Blood Gluc Sensor (DEXCOM G6 SENSOR) MISC INSERT 1 DEVICE INTO THE SKIN EVERY 10 DAYS   Continuous Blood  Gluc Transmit (DEXCOM G6 TRANSMITTER) MISC USE 1 EVERY 3 MONTHS AS DIRECTED (Patient taking differently: 1 each by Other route See admin instructions. q73mths)   doxycycline (VIBRAMYCIN) 100 MG capsule Take 100 mg by mouth daily.   fexofenadine-pseudoephedrine (ALLEGRA-D 24) 180-240 MG 24 hr tablet Take 1 tablet by mouth daily as needed (allergies).   furosemide (LASIX) 20 MG tablet TAKE 1 TABLET BY MOUTH DAILY. IF SIGNIFICANT SWELLING THAT IS NOT RESPONDING TO ELEVATION AND COMPRESSION, TAKE 1 EXTRA TABLET BY MOUTH NO MORE THAN 3 TIMES WEEKLY. (Patient taking differently: Take 20 mg by mouth daily. See instructions)   injection device for insulin (INPEN 100-BLUE-NOVOLOG-FIASP) DEVI USE 1 DEVICE BY OTHER ROUTE ONCE FOR 1 DOSE (Patient taking differently: 1 Device by Other route once.)   insulin aspart (NOVOLOG PENFILL) cartridge MAX DAILY DOSE 80 UNITS (Patient taking differently: Inject 0-80 Units into the skin See admin instructions. MAX DAILY DOSE 80 UNITS)   insulin glargine, 2 Unit Dial, (TOUJEO MAX SOLOSTAR) 300 UNIT/ML Solostar Pen Inject 38 Units into the skin daily at 6 (six) AM.   Insulin Pen Needle 31G X 5 MM MISC Use to inject Novolog insulin up to 4 times daily and Toujeo insulin once daily. (Patient taking differently: 1 each by Other route daily. Use to inject Novolog insulin up to 4 times daily and Toujeo insulin once daily.)   levocetirizine (XYZAL) 5 MG tablet Take 1 tablet (5 mg total) by mouth every evening.   lubiprostone (AMITIZA) 24 MCG capsule Take 1 capsule (24 mcg total) by mouth 2 (two) times daily with a meal.   metoprolol succinate (TOPROL-XL) 25 MG 24 hr tablet Take 1 tablet (25 mg total) by mouth daily.   mupirocin ointment (BACTROBAN) 2 % Apply 1 application  topically daily. 2-3 times daily   mupirocin ointment (BACTROBAN) 2 % Apply 1 Application topically 2 (two) times daily.   Ostomy Supplies (SKIN TAC ADHESIVE BARRIER WIPE) MISC 1 applicator by Does not apply route as  directed.   rizatriptan (MAXALT-MLT) 10 MG disintegrating tablet Take 1 tablet (10 mg total) by mouth as needed for migraine. May repeat in 2 hours if needed   senna-docusate (SENOKOT-S) 8.6-50 MG tablet Take by mouth.   spironolactone (ALDACTONE) 100 MG tablet Take 100 mg by mouth daily.   tirzepatide [facility]) 5 MG/0.5ML Pen Inject 5 mg into the skin once a week.   triamcinolone cream (KENALOG) 0.5 % Apply 1 application topically 2 (two) times daily. To affected areas.   valACYclovir (VALTREX) 500 MG tablet Take 1 tablet (500 mg total) by mouth daily.   Vitamin D, Ergocalciferol, (DRISDOL) 1.25 MG (50000 UNIT) CAPS capsule TAKE 1 CAPSULE BY MOUTH EVERY 7 DAYS (Patient taking differently: Take 50,000 Units by mouth every 7 (seven) days.)   zolpidem (AMBIEN CR) 12.5 MG CR tablet TAKE ONE TABLET BY MOUTH ONE TIME DAILY AT BEDTIME AS NEEDED FOR SLEEP (Patient taking differently: Take 12.5 mg by mouth at  bedtime as needed for sleep. TAKE ONE TABLET BY MOUTH ONE TIME DAILY AT BEDTIME AS NEEDED FOR SLEEP)   [DISCONTINUED] NONFORMULARY OR COMPOUNDED ITEM Antifungal solution: Terbinafine 3%, Fluconazole 2%, Tea Tree Oil 5%, Urea 10%, Ibuprofen 2% in DMSO suspension #76mL (Patient taking differently: Apply 1 application  topically See admin instructions. Antifungal solution: Terbinafine 3%, Fluconazole 2%, Tea Tree Oil 5%, Urea 10%, Ibuprofen 2% in DMSO suspension #36mL)     Allergies:   Adhesive [tape], Latex, and Other   Social History   Socioeconomic History   Marital status: Single    Spouse name: Not on file   Number of children: 1   Years of education: Not on file   Highest education level: Not on file  Occupational History   Not on file  Tobacco Use   Smoking status: Former    Types: Cigarettes    Quit date: 07/27/2012    Years since quitting: 10.5   Smokeless tobacco: Never  Vaping Use   Vaping Use: Never used  Substance and Sexual Activity   Alcohol use: Yes    Comment: 2-3  drinks/month, wine liquor   Drug use: No   Sexual activity: Yes    Partners: Male    Birth control/protection: Surgical  Other Topics Concern   Not on file  Social History Narrative   Not on file   Social Determinants of Health   Financial Resource Strain: Not on file  Food Insecurity: Not on file  Transportation Needs: Not on file  Physical Activity: Not on file  Stress: Not on file  Social Connections: Not on file     Family History: The patient's family history includes Diabetes in her brother, brother, maternal grandmother, mother, and sister; Heart disease in her mother; Heart failure in her mother; Kidney failure in her brother, father, and sister; Other in her brother; Sleep apnea in her brother and sister. There is no history of Colon cancer, Esophageal cancer, Pancreatic cancer, Stomach cancer, or Liver disease. ROS:   Please see the history of present illness.    All 14 point review of systems negative except as described per history of present illness.  EKGs/Labs/Other Studies Reviewed:    The following studies were reviewed today: Echocardiogram reviewed showing borderline left ventricle hypertrophy questionable PFO, otherwise no significant pathology.  EKG:  EKG is  ordered today.  The ekg ordered today demonstrates normal sinus rhythm, normal P interval, normal QS complex duration fulgent no ST segment changes  Recent Labs: 11/22/2022: TSH 0.69  Recent Lipid Panel    Component Value Date/Time   CHOL 129 02/04/2021 0000   TRIG 108 02/04/2021 0000   HDL 52 02/04/2021 0000   CHOLHDL 2.5 02/04/2021 0000   LDLCALC 58 02/04/2021 0000    Physical Exam:    VS:  BP 116/70 (BP Location: Left Arm, Patient Position: Sitting)   Pulse (!) 106   Ht 5' 7.5" (1.715 m)   Wt 224 lb (101.6 kg)   BMI 34.57 kg/m     Wt Readings from Last 3 Encounters:  01/26/23 224 lb (101.6 kg)  01/17/23 215 lb (97.5 kg)  12/24/22 215 lb 11.2 oz (97.8 kg)     GEN:  Well nourished,  well developed in no acute distress HEENT: Normal NECK: No JVD; No carotid bruits LYMPHATICS: No lymphadenopathy CARDIAC: RRR, no murmurs, no rubs, no gallops RESPIRATORY:  Clear to auscultation without rales, wheezing or rhonchi  ABDOMEN: Soft, non-tender, non-distended MUSCULOSKELETAL:  No edema; No deformity  SKIN: Warm and dry NEUROLOGIC:  Alert and oriented x 3 PSYCHIATRIC:  Normal affect   ASSESSMENT:    1. Dyspnea on exertion   2. Type 2 diabetes mellitus with diabetic polyneuropathy, with long-term current use of insulin   3. OSA (obstructive sleep apnea)   4. Paroxysmal tachycardia    PLAN:    In order of problems listed above:  Tachycardia: Look like sinus tachycardia I will ask her to wear Zio patch for 2 weeks to see the heart rate variability during 24 hours.  She is on a small dose of beta-blocker already which I will continue, the biggest potential complication of tachycardia is cardiomyopathy but that usually does not happen with sinus tachycardia and we have just had echocardiogram done which showed preserved left ventricle ejection fraction.  Of course the questions about the etiology of this phenomenon.  She does have slightly low TSH but does not meet criteria of hyperthyroidism but we may consider treating her for it. Multiple risk factors for coronary artery disease probably the biggest is diabetes which being poorly controlled for years.  In 2019 she was noted to have calcification of the coronary artery which being describes as heavy, therefore, we need to rule out obstructive disease even though she does not have any typical symptoms she does have exertional shortness of breath and longstanding diabetes make her unlikely to have typical angina pectoris, therefore her shortness of breath could be representation of angina equivalent.  Eugenie Birks will be scheduled to rule out obstructive disease in the meantime we will continue with antiplatelet therapy and  statin. Dyslipidemia: She is on high intense statin for of Lipitor 40 which I will continue.  I do have last fasting lipid profile from April 2022 when LDL was 58 HDL 52.  Excellent cholesterol control however we will repeat the test to verify that. Questionable PFO.  Hemodynamically insignificant.  I do not think we need to do any investigations for that.  There is no recent TIA or CVA.  The only potential indication for closing this defect will be recent CVA or TIA.  Next year we will repeat echocardiogram to recheck left ventricle hypertrophy will do bubbles to rule out PFO just to confirm diagnosis   Medication Adjustments/Labs and Tests Ordered: Current medicines are reviewed at length with the patient today.  Concerns regarding medicines are outlined above.  No orders of the defined types were placed in this encounter.  No orders of the defined types were placed in this encounter.   Signed, Georgeanna Lea, MD, [facility]. 01/26/2023 2:22 PM     [facility]

## 2023-02-07 ENCOUNTER — Encounter: Payer: Managed Care, Other (non HMO) | Admitting: Family Medicine

## 2023-02-07 ENCOUNTER — Other Ambulatory Visit: Payer: Self-pay

## 2023-02-07 DIAGNOSIS — G4700 Insomnia, unspecified: Secondary | ICD-10-CM

## 2023-02-07 MED ORDER — ZOLPIDEM TARTRATE 10 MG PO TABS: 10.0000 mg | ORAL_TABLET | Freq: Every evening | ORAL | 3 refills | Status: DC | PRN

## 2023-02-07 NOTE — Progress Notes (Signed)
[facility] called stating that the 12.5 MG is on back order and is requesting a Rx for 10 MG.

## 2023-02-08 ENCOUNTER — Other Ambulatory Visit: Payer: Self-pay

## 2023-02-08 MED ORDER — DEXCOM G6 SENSOR MISC: 3 refills | Status: DC

## 2023-02-08 MED ORDER — METOPROLOL SUCCINATE ER 25 MG PO TB24: 25.0000 mg | ORAL_TABLET | Freq: Every day | ORAL | 3 refills | Status: DC

## 2023-02-08 MED ORDER — LUBIPROSTONE 24 MCG PO CAPS: 24.0000 ug | ORAL_CAPSULE | Freq: Two times a day (BID) | ORAL | 2 refills | Status: DC

## 2023-02-08 MED ORDER — VALACYCLOVIR HCL 500 MG PO TABS: 500.0000 mg | ORAL_TABLET | Freq: Every day | ORAL | 0 refills | Status: DC

## 2023-02-08 MED ORDER — ATORVASTATIN CALCIUM 40 MG PO TABS: 40.0000 mg | ORAL_TABLET | Freq: Every day | ORAL | 0 refills | Status: DC

## 2023-02-08 MED ORDER — AMITRIPTYLINE HCL 50 MG PO TABS: 50.0000 mg | ORAL_TABLET | Freq: Every day | ORAL | 1 refills | Status: DC

## 2023-02-08 MED ORDER — RIZATRIPTAN BENZOATE 10 MG PO TBDP: 10.0000 mg | ORAL_TABLET | ORAL | 3 refills | Status: DC | PRN

## 2023-02-09 ENCOUNTER — Telehealth (HOSPITAL_COMMUNITY): Payer: Self-pay | Admitting: *Deleted

## 2023-02-09 NOTE — Telephone Encounter (Signed)
Left message on voicemail per DPR in reference to upcoming appointment scheduled on 02/14/23 at 8:00 with detailed instructions given per Myocardial Perfusion Study Information Sheet for the test. LM to arrive 15 minutes early, and that it is imperative to arrive on time for appointment to keep from having the test rescheduled. If you need to cancel or reschedule your appointment, please call the office within 24 hours of your appointment. Failure to do so may result in a cancellation of your appointment, and a $50 no show fee. Phone number given for call back for any questions.

## 2023-02-14 ENCOUNTER — Ambulatory Visit (HOSPITAL_COMMUNITY): Payer: Managed Care, Other (non HMO) | Attending: Cardiology

## 2023-02-14 ENCOUNTER — Other Ambulatory Visit: Payer: Self-pay

## 2023-02-14 DIAGNOSIS — R0609 Other forms of dyspnea: Secondary | ICD-10-CM | POA: Insufficient documentation

## 2023-02-14 LAB — MYOCARDIAL PERFUSION IMAGING
LV dias vol: 64 mL (ref 46–106)
LV sys vol: 35 mL
Nuc Stress EF: 45 %
Peak HR: 111 {beats}/min
Rest HR: 107 {beats}/min
Rest Nuclear Isotope Dose: 10.5 mCi
SDS: 0
SRS: 0
SSS: 0
ST Depression (mm): 0 mm
Stress Nuclear Isotope Dose: 32.8 mCi
TID: 1.12

## 2023-02-14 MED ORDER — TECHNETIUM TC 99M TETROFOSMIN IV KIT
32.8000 | PACK | Freq: Once | INTRAVENOUS | Status: AC | PRN
  Administered 2023-02-14: 32.8 via INTRAVENOUS

## 2023-02-14 MED ORDER — REGADENOSON 0.4 MG/5ML IV SOLN
0.4000 mg | Freq: Once | INTRAVENOUS | Status: AC
Start: 2023-02-14 — End: 2023-02-14
  Administered 2023-02-14: 0.4 mg via INTRAVENOUS

## 2023-02-14 MED ORDER — TECHNETIUM TC 99M TETROFOSMIN IV KIT
10.5000 | PACK | Freq: Once | INTRAVENOUS | Status: AC | PRN
  Administered 2023-02-14: 10.5 via INTRAVENOUS

## 2023-02-16 NOTE — Progress Notes (Signed)
PATIENT: Helen Perez DOB: 06/13/1966  REASON FOR VISIT: follow up HISTORY FROM: patient PRIMARY NEUROLOGIST: Dr. Frances Furbish  Chief Complaint  Patient presents with   Follow-up    Rm 4, alone.   Doing ok, pt stated does feel more refreshed using the machine.  ESS 9.       HISTORY OF PRESENT ILLNESS:   Today 02/17/23  Helen Perez is a 57 y.o. female with a history of OSA on CPAP. Returns today for follow-up.  Reports that CPAP machine is working well for her.  She states that she does need a new mask as hers split.  She does feel well rested.  Denies significant daytime sleepiness or fatigue.  Download is below      REVIEW OF SYSTEMS: Out of a complete 14 system review of symptoms, the patient complains only of the following symptoms, and all other reviewed systems are negative.  ESS 9  ALLERGIES: Allergies  Allergen Reactions   Adhesive [Tape]     From dexcom   Latex Itching and Swelling   Other Dermatitis    FREESTYLE LIBRE SENSOR- cellulitis, wound on skin     HOME MEDICATIONS: Outpatient Medications Prior to Visit  Medication Sig Dispense Refill   acetaminophen (TYLENOL) 650 MG CR tablet Take 1 tablet by mouth as needed for pain or fever.     acyclovir ointment (ZOVIRAX) 5 % Apply 1 Application topically every 3 (three) hours.     AMBULATORY NON FORMULARY MEDICATION Continuous positive airway pressure (CPAP) machine set on AutoPAP (4-20 cmH2O), with all supplemental supplies as needed. (Patient taking differently: 1 each by Other route See admin instructions. Continuous positive airway pressure (CPAP) machine set on AutoPAP (4-20 cmH2O), with all supplemental supplies as needed.) 1 each 0   amitriptyline (ELAVIL) 50 MG tablet Take 1 tablet (50 mg total) by mouth at bedtime. 90 tablet 1   aspirin EC 81 MG tablet Take 1 tablet by mouth daily.     atorvastatin (LIPITOR) 40 MG tablet Take 1 tablet (40 mg total) by mouth daily. 90 tablet 0   buPROPion HCl ER, XL,  450 MG TB24 Take 450 mg by mouth daily. 90 tablet 1   ciclopirox (PENLAC) 8 % solution Apply topically at bedtime. Apply over nail and surrounding skin. Apply daily over previous coat. After seven (7) days, may remove with alcohol and continue cycle. (Patient taking differently: Apply 1 application  topically at bedtime. Apply over nail and surrounding skin. Apply daily over previous coat. After seven (7) days, may remove with alcohol and continue cycle.) 6.6 mL 0   clindamycin (CLEOCIN T) 1 % SWAB Apply 1 Application topically 2 (two) times daily. Apply to the affected areas in the axilla and groin. 180 each 1   clotrimazole-betamethasone (LOTRISONE) cream APPLY TO THE AFFECTED AREA(S) 2 TIMES A DAY (Patient taking differently: Apply 1 Application topically 2 (two) times daily.) 45 g 2   Continuous Blood Gluc Receiver (DEXCOM G6 RECEIVER) DEVI USE AS INSTRUCTED TO CHECK BLOOD SUGAR DAILY (Patient taking differently: 1 each by Other route See admin instructions. USE AS INSTRUCTED TO CHECK BLOOD SUGAR DAILY) 1 each 0   Continuous Blood Gluc Transmit (DEXCOM G6 TRANSMITTER) MISC USE 1 EVERY 3 MONTHS AS DIRECTED (Patient taking differently: 1 each by Other route See admin instructions. q58mths) 1 each 3   Continuous Glucose Sensor (DEXCOM G6 SENSOR) MISC INSERT 1 DEVICE INTO THE SKIN EVERY 10 DAYS 9 each 3   doxycycline (VIBRAMYCIN) 100  MG capsule Take 100 mg by mouth daily.     fexofenadine-pseudoephedrine (ALLEGRA-D 24) 180-240 MG 24 hr tablet Take 1 tablet by mouth daily as needed (allergies).     furosemide (LASIX) 20 MG tablet TAKE 1 TABLET BY MOUTH DAILY. IF SIGNIFICANT SWELLING THAT IS NOT RESPONDING TO ELEVATION AND COMPRESSION, TAKE 1 EXTRA TABLET BY MOUTH NO MORE THAN 3 TIMES WEEKLY. (Patient taking differently: Take 20 mg by mouth daily. See instructions) 105 tablet 3   injection device for insulin (INPEN 100-BLUE-NOVOLOG-FIASP) DEVI USE 1 DEVICE BY OTHER ROUTE ONCE FOR 1 DOSE (Patient taking  differently: 1 Device by Other route once.) 1 each 3   insulin aspart (NOVOLOG PENFILL) cartridge MAX DAILY DOSE 80 UNITS (Patient taking differently: Inject 0-80 Units into the skin See admin instructions. MAX DAILY DOSE 80 UNITS) 45 mL 3   insulin glargine, 2 Unit Dial, (TOUJEO MAX SOLOSTAR) 300 UNIT/ML Solostar Pen Inject 38 Units into the skin daily at 6 (six) AM. 6 mL 3   Insulin Pen Needle 31G X 5 MM MISC Use to inject Novolog insulin up to 4 times daily and Toujeo insulin once daily. (Patient taking differently: 1 each by Other route daily. Use to inject Novolog insulin up to 4 times daily and Toujeo insulin once daily.) 150 each 11   levocetirizine (XYZAL) 5 MG tablet Take 1 tablet (5 mg total) by mouth every evening. 30 tablet 1   lubiprostone (AMITIZA) 24 MCG capsule Take 1 capsule (24 mcg total) by mouth 2 (two) times daily with a meal. 180 capsule 2   metoprolol succinate (TOPROL-XL) 25 MG 24 hr tablet Take 1 tablet (25 mg total) by mouth daily. 90 tablet 3   mupirocin ointment (BACTROBAN) 2 % Apply 1 application  topically daily. 2-3 times daily     mupirocin ointment (BACTROBAN) 2 % Apply 1 Application topically 2 (two) times daily. 30 g 2   Ostomy Supplies (SKIN TAC ADHESIVE BARRIER WIPE) MISC 1 applicator by Does not apply route as directed. 50 each 6   rizatriptan (MAXALT-MLT) 10 MG disintegrating tablet Take 1 tablet (10 mg total) by mouth as needed for migraine. May repeat in 2 hours if needed 10 tablet 3   senna-docusate (SENOKOT-S) 8.6-50 MG tablet Take by mouth.     spironolactone (ALDACTONE) 100 MG tablet Take 100 mg by mouth daily.     tirzepatide [facility]) 5 MG/0.5ML Pen Inject 5 mg into the skin once a week. 6 mL 3   triamcinolone cream (KENALOG) 0.5 % Apply 1 application topically 2 (two) times daily. To affected areas. 30 g 3   valACYclovir (VALTREX) 500 MG tablet Take 1 tablet (500 mg total) by mouth daily. 90 tablet 0   Vitamin D, Ergocalciferol, (DRISDOL) 1.25 MG  (50000 UNIT) CAPS capsule TAKE 1 CAPSULE BY MOUTH EVERY 7 DAYS (Patient taking differently: Take 50,000 Units by mouth every 7 (seven) days.) 2 capsule 0   zolpidem (AMBIEN CR) 12.5 MG CR tablet TAKE ONE TABLET BY MOUTH ONE TIME DAILY AT BEDTIME AS NEEDED FOR SLEEP (Patient taking differently: Take 12.5 mg by mouth at bedtime as needed for sleep. TAKE ONE TABLET BY MOUTH ONE TIME DAILY AT BEDTIME AS NEEDED FOR SLEEP) 30 tablet 5   zolpidem (AMBIEN) 10 MG tablet Take 1 tablet (10 mg total) by mouth at bedtime as needed for sleep. 30 tablet 3   No facility-administered medications prior to visit.    PAST MEDICAL HISTORY: Past Medical History:  Diagnosis Date  Anxiety    Depression    Diabetes mellitus without complication (HCC)    H/O syphilis    HSV-2 infection    Hypertension associated with diabetes (HCC) 03/30/2018   Renal insufficiency 10/13/2018   Sleep apnea    CPAP   Stroke (HCC)     PAST SURGICAL HISTORY: Past Surgical History:  Procedure Laterality Date   ABDOMINAL HYSTERECTOMY     FOOT SURGERY Right    FOOT SURGERY Left     FAMILY HISTORY: Family History  Problem Relation Age of Onset   Diabetes Mother    Heart disease Mother    Heart failure Mother    Kidney failure Father    Diabetes Sister    Kidney failure Sister    Sleep apnea Sister    Other Brother        quadraplegia   Diabetes Brother    Kidney failure Brother    Sleep apnea Brother    Diabetes Brother    Diabetes Maternal Grandmother    Colon cancer Neg Hx    Esophageal cancer Neg Hx    Pancreatic cancer Neg Hx    Stomach cancer Neg Hx    Liver disease Neg Hx     SOCIAL HISTORY: Social History   Socioeconomic History   Marital status: Single    Spouse name: Not on file   Number of children: 1   Years of education: Not on file   Highest education level: Not on file  Occupational History   Not on file  Tobacco Use   Smoking status: Former    Types: Cigarettes    Quit date:  07/27/2012    Years since quitting: 10.5   Smokeless tobacco: Never  Vaping Use   Vaping Use: Never used  Substance and Sexual Activity   Alcohol use: Yes    Comment: 2-3 drinks/month, wine liquor   Drug use: No   Sexual activity: Yes    Partners: Male    Birth control/protection: Surgical  Other Topics Concern   Not on file  Social History Narrative   Not on file   Social Determinants of Health   Financial Resource Strain: Not on file  Food Insecurity: Not on file  Transportation Needs: Not on file  Physical Activity: Not on file  Stress: Not on file  Social Connections: Not on file  Intimate Partner Violence: Not on file      PHYSICAL EXAM  Vitals:   02/17/23 1420  BP: 113/71  Pulse: (!) 109  Weight: 222 lb 3.2 oz (100.8 kg)  Height: 5' 7.5" (1.715 m)   Body mass index is 34.29 kg/m.  Generalized: Well developed, in no acute distress  Chest: Lungs clear to auscultation bilaterally  Neurological examination  Mentation: Alert oriented to time, place, history taking. Follows all commands speech and language fluent Cranial nerve II-XII: Facial symmetry noted   DIAGNOSTIC DATA (LABS, IMAGING, TESTING) - I reviewed patient records, labs, notes, testing and imaging myself where available.  Lab Results  Component Value Date   WBC 8.7 01/18/2022   HGB 11.5 (L) 01/18/2022   HCT 37.0 01/18/2022   MCV 84.7 01/18/2022   PLT 310 01/18/2022      Component Value Date/Time   NA 138 01/18/2022 1625   NA 137 10/13/2021 1309   K 3.9 01/18/2022 1625   CL 103 01/18/2022 1625   CO2 25 01/18/2022 1625   GLUCOSE 203 (H) 01/18/2022 1625   BUN 20 01/18/2022 1625   BUN  20 10/13/2021 1309   CREATININE 1.07 (H) 01/18/2022 1625   CREATININE 1.18 (H) 02/04/2021 0000   CALCIUM 9.4 01/18/2022 1625   PROT 8.0 01/18/2022 1625   ALBUMIN 3.7 01/18/2022 1625   AST 22 01/18/2022 1625   ALT 35 01/18/2022 1625   ALKPHOS 145 (H) 01/18/2022 1625   BILITOT 0.7 01/18/2022 1625    GFRNONAA >60 01/18/2022 1625   GFRNONAA 52 (L) 02/04/2021 0000   GFRAA 60 02/04/2021 0000   Lab Results  Component Value Date   CHOL 129 02/04/2021   HDL 52 02/04/2021   LDLCALC 58 02/04/2021   TRIG 108 02/04/2021   CHOLHDL 2.5 02/04/2021   Lab Results  Component Value Date   HGBA1C 10.5 (A) 01/17/2023   No results found for: "VITAMINB12" Lab Results  Component Value Date   TSH 0.69 11/22/2022      ASSESSMENT AND PLAN 57 y.o. year old female  has a past medical history of Anxiety, Depression, Diabetes mellitus without complication (HCC), H/O syphilis, HSV-2 infection, Hypertension associated with diabetes (HCC) (03/30/2018), Renal insufficiency (10/13/2018), Sleep apnea, and Stroke (HCC). here with:  OSA on CPAP  - CPAP compliance excellent - Good treatment of AHI  - Encourage patient to use CPAP nightly and > 4 hours each night - F/U in 1 year or sooner if needed    Butch Penny, MSN, NP-C 02/17/2023, 2:17 PM [facility] [address], [address] [place], [place] [address] [phone]

## 2023-02-17 ENCOUNTER — Other Ambulatory Visit: Payer: Self-pay | Admitting: Medical-Surgical

## 2023-02-17 ENCOUNTER — Telehealth: Payer: Self-pay | Admitting: Cardiology

## 2023-02-17 ENCOUNTER — Encounter: Payer: Self-pay | Admitting: Adult Health

## 2023-02-17 ENCOUNTER — Ambulatory Visit: Payer: Managed Care, Other (non HMO) | Attending: Cardiology

## 2023-02-17 ENCOUNTER — Ambulatory Visit: Payer: Managed Care, Other (non HMO) | Admitting: Adult Health

## 2023-02-17 ENCOUNTER — Other Ambulatory Visit: Payer: Self-pay

## 2023-02-17 VITALS — BP 113/71 | HR 109 | Ht 67.5 in | Wt 222.2 lb

## 2023-02-17 DIAGNOSIS — I479 Paroxysmal tachycardia, unspecified: Secondary | ICD-10-CM

## 2023-02-17 DIAGNOSIS — G4733 Obstructive sleep apnea (adult) (pediatric): Secondary | ICD-10-CM | POA: Diagnosis not present

## 2023-02-17 NOTE — Telephone Encounter (Signed)
Pt advised that the zio monitor has now been ordered.

## 2023-02-17 NOTE — Telephone Encounter (Signed)
Patient would like an update on heart monitor that was suppose to be mailed to her. Please advise.

## 2023-02-17 NOTE — Patient Instructions (Signed)
Continue using CPAP nightly and greater than 4 hours each night If your symptoms worsen or you develop new symptoms please let us know.   Order sent for new mask Your DME is [facility] [phone]

## 2023-02-17 NOTE — Telephone Encounter (Signed)
Not managed by our practice.  Unclear who is prescribing this.  Possibly dermatology?

## 2023-02-18 ENCOUNTER — Telehealth: Payer: Self-pay

## 2023-02-18 NOTE — Telephone Encounter (Signed)
-----   Message from Georgeanna Lea, MD sent at 02/17/2023  8:30 AM EDT ----- Stress test showing no evidence of ischemia

## 2023-02-18 NOTE — Telephone Encounter (Signed)
Patient notified through my chart.

## 2023-03-02 ENCOUNTER — Other Ambulatory Visit: Payer: Self-pay | Admitting: Medical-Surgical

## 2023-03-02 ENCOUNTER — Other Ambulatory Visit: Payer: Self-pay | Admitting: Internal Medicine

## 2023-03-03 ENCOUNTER — Encounter: Payer: Self-pay | Admitting: Medical-Surgical

## 2023-03-04 MED ORDER — BUPROPION HCL ER (XL) 450 MG PO TB24
450.0000 mg | ORAL_TABLET | Freq: Every day | ORAL | 1 refills | Status: DC
Start: 2023-03-04 — End: 2023-03-09

## 2023-03-04 NOTE — Telephone Encounter (Signed)
Needs appt

## 2023-03-09 ENCOUNTER — Telehealth: Payer: Self-pay | Admitting: Cardiology

## 2023-03-09 ENCOUNTER — Telehealth: Payer: Self-pay

## 2023-03-09 MED ORDER — BUPROPION HCL ER (XL) 150 MG PO TB24: 150.0000 mg | ORAL_TABLET | ORAL | 3 refills | Status: DC

## 2023-03-09 MED ORDER — BUPROPION HCL ER (XL) 300 MG PO TB24: 300.0000 mg | ORAL_TABLET | Freq: Every day | ORAL | 3 refills | Status: DC

## 2023-03-09 NOTE — Telephone Encounter (Signed)
Patient called stating she was order a monitor a month ago but she never received one.  She got a message from the company telling her she needed to return it, but she never received one.

## 2023-03-09 NOTE — Telephone Encounter (Signed)
[facility] called and states Helen Perez's insurance doesn't cover bupropion 450 mg. The do cover 150 mg and 300 mg.

## 2023-03-09 NOTE — Telephone Encounter (Signed)
Wellbutrin 450mg  tablet discontinued. Prescriptions sent for Wellbutrin 300mg  and 150mg  to take together daily to equal 450mg .  ___________________________________________ Thayer Ohm, DNP, APRN, FNP-BC Primary Care and Sports Medicine [facility] [place]

## 2023-03-10 ENCOUNTER — Other Ambulatory Visit: Payer: Self-pay | Admitting: Medical-Surgical

## 2023-03-10 NOTE — Telephone Encounter (Signed)
Also message sent to patient via Mychart.

## 2023-03-10 NOTE — Telephone Encounter (Signed)
Left message for pt to call back about the monitor. Neeral at [facility] tracked the package and it was delivered to [address] [facility] [place] On May 6th. He said we can mark it as lost and send another one if pt is agreeable.

## 2023-03-10 NOTE — Telephone Encounter (Signed)
Spoke with pt about her address and where the monitor got sent to. She wanted the monitor sent to her temporary address on [address] in [place]. It had been sent to the permanent address listed in chart. Neeral from [employer] will send out new monitor to address requested by pt. [address] [place] [address]

## 2023-03-10 NOTE — Telephone Encounter (Signed)
Attempted call to patient. Left voice mail message requesting a return call.  

## 2023-03-10 NOTE — Telephone Encounter (Signed)
Patient informed. 

## 2023-03-15 DIAGNOSIS — I479 Paroxysmal tachycardia, unspecified: Secondary | ICD-10-CM

## 2023-03-18 ENCOUNTER — Other Ambulatory Visit: Payer: Self-pay | Admitting: Medical-Surgical

## 2023-03-21 MED ORDER — SPIRONOLACTONE 100 MG PO TABS
100.0000 mg | ORAL_TABLET | Freq: Every day | ORAL | 0 refills | Status: DC
  Filled 2023-03-21: qty 30, 30d supply, fill #0

## 2023-03-22 ENCOUNTER — Other Ambulatory Visit: Payer: Self-pay

## 2023-03-31 ENCOUNTER — Ambulatory Visit: Payer: Managed Care, Other (non HMO) | Admitting: Internal Medicine

## 2023-03-31 ENCOUNTER — Telehealth: Payer: Self-pay | Admitting: Internal Medicine

## 2023-03-31 ENCOUNTER — Other Ambulatory Visit: Payer: Self-pay

## 2023-03-31 VITALS — BP 118/70 | HR 116 | Ht 67.5 in | Wt 222.2 lb

## 2023-03-31 DIAGNOSIS — E1142 Type 2 diabetes mellitus with diabetic polyneuropathy: Secondary | ICD-10-CM | POA: Diagnosis not present

## 2023-03-31 DIAGNOSIS — E785 Hyperlipidemia, unspecified: Secondary | ICD-10-CM | POA: Diagnosis not present

## 2023-03-31 DIAGNOSIS — E1165 Type 2 diabetes mellitus with hyperglycemia: Secondary | ICD-10-CM

## 2023-03-31 DIAGNOSIS — Z794 Long term (current) use of insulin: Secondary | ICD-10-CM

## 2023-03-31 DIAGNOSIS — E1159 Type 2 diabetes mellitus with other circulatory complications: Secondary | ICD-10-CM | POA: Diagnosis not present

## 2023-03-31 DIAGNOSIS — N179 Acute kidney failure, unspecified: Secondary | ICD-10-CM

## 2023-03-31 LAB — BASIC METABOLIC PANEL
BUN: 28 mg/dL — ABNORMAL HIGH (ref 6–23)
CO2: 27 mEq/L (ref 19–32)
Calcium: 10.1 mg/dL (ref 8.4–10.5)
Chloride: 96 mEq/L (ref 96–112)
Creatinine, Ser: 1.46 mg/dL — ABNORMAL HIGH (ref 0.40–1.20)
GFR: 39.52 mL/min — ABNORMAL LOW (ref >60.00)
Glucose, Bld: 171 mg/dL — ABNORMAL HIGH (ref 70–99)
Potassium: 3.7 mEq/L (ref 3.5–5.1)
Sodium: 133 mEq/L — ABNORMAL LOW (ref 135–145)

## 2023-03-31 LAB — LIPID PANEL
Cholesterol: 138 mg/dL (ref 0–200)
HDL: 51.3 mg/dL (ref >39.00)
LDL Cholesterol: 53 mg/dL (ref 0–99)
NonHDL: 86.32
Total CHOL/HDL Ratio: 3
Triglycerides: 167 mg/dL — ABNORMAL HIGH (ref 0.0–149.0)
VLDL: 33.4 mg/dL (ref 0.0–40.0)

## 2023-03-31 LAB — POCT GLYCOSYLATED HEMOGLOBIN (HGB A1C): Hemoglobin A1C: 9.1 % — AB (ref 4.0–5.6)

## 2023-03-31 MED ORDER — INSULIN PEN NEEDLE 31G X 5 MM MISC
1.0000 | Freq: Four times a day (QID) | 3 refills | Status: DC
  Filled 2023-03-31: qty 200, 50d supply, fill #0
  Filled 2023-04-16: qty 400, 80d supply, fill #0

## 2023-03-31 MED ORDER — TIRZEPATIDE 7.5 MG/0.5ML ~~LOC~~ SOAJ
7.5000 mg | SUBCUTANEOUS | 3 refills | Status: DC
  Filled 2023-03-31: qty 2, 28d supply, fill #0
  Filled 2023-04-25: qty 2, 28d supply, fill #1
  Filled 2023-04-27 – 2023-05-16 (×2): qty 2, 28d supply, fill #2
  Filled 2023-06-21: qty 2, 28d supply, fill #3
  Filled 2023-07-26 – 2023-08-18 (×2): qty 2, 28d supply, fill #4
  Filled 2023-09-05 – 2023-09-12 (×3): qty 2, 28d supply, fill #5

## 2023-03-31 MED ORDER — TOUJEO MAX SOLOSTAR 300 UNIT/ML ~~LOC~~ SOPN
42.0000 [IU] | PEN_INJECTOR | Freq: Every day | SUBCUTANEOUS | 3 refills | Status: DC
  Filled 2023-03-31 (×2): qty 6, 42d supply, fill #0
  Filled 2023-04-16: qty 12, 85d supply, fill #0
  Filled 2023-05-16 – 2023-06-21 (×2): qty 12, 85d supply, fill #1
  Filled 2023-07-26 – 2023-09-05 (×2): qty 12, 85d supply, fill #2

## 2023-03-31 MED ORDER — DEXCOM G7 SENSOR MISC
1.0000 | 3 refills | Status: DC
  Filled 2023-03-31: qty 3, 30d supply, fill #0
  Filled 2023-04-25 – 2023-10-14 (×3): qty 3, 30d supply, fill #1
  Filled 2023-11-04 – 2023-11-07 (×2): qty 3, 30d supply, fill #2
  Filled 2023-12-05 (×2): qty 3, 30d supply, fill #3
  Filled 2023-12-28 – 2024-01-11 (×2): qty 3, 30d supply, fill #4
  Filled 2024-02-01 – 2024-02-16 (×4): qty 3, 30d supply, fill #5
  Filled 2024-03-16: qty 3, 30d supply, fill #6
  Filled 2024-03-30: qty 3, 30d supply, fill #7

## 2023-03-31 MED ORDER — NOVOLOG PENFILL 100 UNIT/ML ~~LOC~~ SOCT
80.0000 [IU] | Freq: Every day | SUBCUTANEOUS | 4 refills | Status: DC
  Filled 2023-03-31 – 2023-05-19 (×4): qty 45, 56d supply, fill #0

## 2023-03-31 NOTE — Telephone Encounter (Signed)
Samples given.  

## 2023-03-31 NOTE — Patient Instructions (Signed)
Please contact [employer] at [phone] to start the application process for a new pump   -Increase Mounjaro 7.5 mg weekly  -Continue Toujeo  42 units daily  -Take  Novolog 20 units with Breakfast and 20 units Lunch and 20 units with Supper - Novolog correctional insulin: ADD extra units on insulin to your meal-time Novolog dose if your blood sugars are higher than 150. Use the scale below to help guide you:   Blood sugar before meal Number of units to inject  Less than 150 0 unit  151 -  170 1 units  171 -  185 2 units  186 -  205 3 units  206 -  225 4 units  226 -  245 5 units  246 -  266 6 units  267 -  286 7 units  287 -  306 8 units  306 - 326 9 units       HOW TO TREAT LOW BLOOD SUGARS (Blood sugar LESS THAN 70 MG/DL) Please follow the RULE OF 15 for the treatment of hypoglycemia treatment (when your (blood sugars are less than 70 mg/dL)   STEP 1: Take 15 grams of carbohydrates when your blood sugar is low, which includes:  3-4 GLUCOSE TABS  OR 3-4 OZ OF JUICE OR REGULAR SODA OR ONE TUBE OF GLUCOSE GEL    STEP 2: RECHECK blood sugar in 15 MINUTES STEP 3: If your blood sugar is still low at the 15 minute recheck --> then, go back to STEP 1 and treat AGAIN with another 15 grams of carbohydrates.

## 2023-03-31 NOTE — Progress Notes (Signed)
Name: Helen Perez  Age/ Sex: 57 y.o., female   MRN/ DOB: [id number], 10/05/1966     PCP: Christen Butter, NP   Reason for Endocrinology Evaluation: Type 2 Diabetes Mellitus  Initial Endocrine Consultative Visit: 01/30/2020    PATIENT IDENTIFIER: [name] is a 57 y.o. female with a past medical history of HTN, TIA, OSA and T2DM. The patient has followed with Endocrinology clinic since 01/30/2020 for consultative assistance with management of her diabetes.  DIABETIC HISTORY:  Ms. Zervas was diagnosed with T2DM in 2000. She reports intolerance to Metformin.  She has been on an insulin pump for years.  Her hemoglobin A1c has ranged from 10.1% in 12/10/2019, peaking at 15.7% in 2016.  On her initial visit to our clinic her A1c was 12.5% , she was on Ozempic and novolog through insulin pump. We did not make any changes as she  Uses the pump only periodically as well as the Decoxm. Chronic hx of non-compliance, and was given the option to switch to MDI vs using the pump properly , she opted to continue with the pump at the time.     By 04/2020 we stopped insulin pump and started MDI regimen   Farxiga started 10/2020 and stopped by 02/2021 due to worsening genital infections  We switched Ozempic to [facility] 08/2022    SUBJECTIVE:   During the last visit (01/17/2023): A1c 10.5% .       Today (04/01/2023): [name] is here for a follow up on diabetes  She checks her blood sugars multiple  times daily, through the dexcom. The patient has not had hypoglycemic episodes since the last clinic visit.  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, S/P left foot sx 11/2021, last seen 01/03/2023 She had a follow-up with neurology for OSA   Denies nausea, vomiting Denies constipation or diarrhea   She has been out of Toujeo for 2 days  HOME DIABETES REGIMEN:  - Toujeo 42 units once daily  - Novolog 20 units TIDQAC  -Mounjaro 5 mg weekly - Novolog (BG-130/20)     Statin:  Yes ACE-I/ARB: yes     CONTINUOUS GLUCOSE MONITORING RECORD INTERPRETATION    Dates of Recording: 5/30-6/13/2024   Sensor description:Dexcom  Results statistics:   CGM use % of time 93 %  Average and SD 212/76  Time in range 40  % Time Above 180 30  % Time above 250 29  % Time Below target <1       Glycemic patterns summary: BG's trend down overnight but increased during the day   Hyperglycemic episodes postprandial  Hypoglycemic episodes occurred n/a  Overnight periods trend down     INpen Report :no updated report      DIABETIC COMPLICATIONS: Microvascular complications:  Neuropathy Denies: retinopathy,  CKD Last eye exam: Completed 2021   Macrovascular complications:  CVA Denies: CAD, PVD    HISTORY:  Past Medical History:  Past Medical History:  Diagnosis Date   Anxiety    Depression    Diabetes mellitus without complication (HCC)    H/O syphilis    HSV-2 infection    Hypertension associated with diabetes (HCC) 03/30/2018   Renal insufficiency 10/13/2018   Sleep apnea    CPAP   Stroke [facility])    Past Surgical History:  Past Surgical History:  Procedure Laterality Date   ABDOMINAL HYSTERECTOMY     FOOT SURGERY Right    FOOT SURGERY Left    Social History:  reports that  she quit smoking about 10 years ago. Her smoking use included cigarettes. She has never used smokeless tobacco. She reports current alcohol use. She reports that she does not use drugs. Family History:  Family History  Problem Relation Age of Onset   Diabetes Mother    Heart disease Mother    Heart failure Mother    Kidney failure Father    Diabetes Sister    Kidney failure Sister    Sleep apnea Sister    Other Brother        quadraplegia   Diabetes Brother    Kidney failure Brother    Sleep apnea Brother    Diabetes Brother    Diabetes Maternal Grandmother    Colon cancer Neg Hx    Esophageal cancer Neg Hx    Pancreatic cancer Neg Hx    Stomach cancer Neg  Hx    Liver disease Neg Hx      HOME MEDICATIONS: Allergies as of 03/31/2023       Reactions   Adhesive [tape]    From dexcom   Latex Itching, Swelling   Other Dermatitis   FREESTYLE LIBRE SENSOR- cellulitis, wound on skin         Medication List        Accurate as of March 31, 2023 11:59 PM. If you have any questions, ask your nurse or doctor.          STOP taking these medications    Dexcom G6 Receiver Devi Stopped by: Scarlette Shorts, MD   Dexcom G6 Transmitter Misc Stopped by: Scarlette Shorts, MD   Mounjaro 5 MG/0.5ML Pen Generic drug: tirzepatide Replaced by: Mounjaro 7.5 MG/0.5ML Pen Stopped by: Scarlette Shorts, MD       TAKE these medications    acetaminophen 650 MG CR tablet Commonly known as: TYLENOL Take 1 tablet by mouth as needed for pain or fever.   acyclovir ointment 5 % Commonly known as: ZOVIRAX Apply 1 Application topically every 3 (three) hours.   AMBULATORY NON FORMULARY MEDICATION Continuous positive airway pressure (CPAP) machine set on AutoPAP (4-20 cmH2O), with all supplemental supplies as needed. What changed:  how much to take how to take this when to take this   amitriptyline 50 MG tablet Commonly known as: ELAVIL Take 1 tablet (50 mg total) by mouth at bedtime.   aspirin EC 81 MG tablet Take 1 tablet by mouth daily.   atorvastatin 40 MG tablet Commonly known as: LIPITOR Take 1 tablet (40 mg total) by mouth daily.   buPROPion 150 MG 24 hr tablet Commonly known as: Wellbutrin XL Take 1 tablet (150 mg total) by mouth every morning. Take with 150mg  dose to equal 450mg  daily   buPROPion 300 MG 24 hr tablet Commonly known as: Wellbutrin XL Take 1 tablet (300 mg total) by mouth daily. Take with 150mg  dose to equal 450mg  daily   ciclopirox 8 % solution Commonly known as: PENLAC Apply topically at bedtime. Apply over nail and surrounding skin. Apply daily over previous coat. After seven (7) days, may  remove with alcohol and continue cycle. What changed: how much to take   clindamycin 1 % Swab Commonly known as: CLEOCIN T Apply 1 Application topically 2 (two) times daily. Apply to the affected areas in the axilla and groin.   clotrimazole-betamethasone cream Commonly known as: LOTRISONE APPLY TO THE AFFECTED AREA(S) 2 TIMES A DAY What changed: See the new instructions.   Dexcom G7 Sensor Misc Use as  directed What changed:  how much to take how to take this when to take this additional instructions Changed by: Scarlette Shorts, MD   doxycycline 100 MG capsule Commonly known as: VIBRAMYCIN Take 100 mg by mouth daily.   fexofenadine-pseudoephedrine 180-240 MG 24 hr tablet Commonly known as: ALLEGRA-D 24 Take 1 tablet by mouth daily as needed (allergies).   furosemide 20 MG tablet Commonly known as: LASIX TAKE 1 TABLET BY MOUTH DAILY. IF SIGNIFICANT SWELLING THAT IS NOT RESPONDING TO ELEVATION AND COMPRESSION, TAKE 1 EXTRA TABLET BY MOUTH NO MORE THAN 3 TIMES WEEKLY. What changed: See the new instructions.   InPen 100-Blue-Novolog-Fiasp Devi Generic drug: injection device for insulin USE 1 DEVICE BY OTHER ROUTE ONCE FOR 1 DOSE What changed: See the new instructions.   Insulin Pen Needle 31G X 5 MM Misc Use as directed in the morning, at noon, in the evening, and at bedtime. Use to inject Novolog insulin up to 4 times daily and Toujeo insulin once daily. What changed:  how much to take how to take this when to take this additional instructions Changed by: Scarlette Shorts, MD   levocetirizine 5 MG tablet Commonly known as: XYZAL Take 1 tablet (5 mg total) by mouth every evening.   lubiprostone 24 MCG capsule Commonly known as: AMITIZA Take 1 capsule (24 mcg total) by mouth 2 (two) times daily with a meal.   metoprolol succinate 25 MG 24 hr tablet Commonly known as: TOPROL-XL Take 1 tablet (25 mg total) by mouth daily.   Mounjaro 7.5 MG/0.5ML  Pen Generic drug: tirzepatide Inject 7.5 mg into the skin once a week. Replaces: Mounjaro 5 MG/0.5ML Pen Started by: Scarlette Shorts, MD   mupirocin ointment 2 % Commonly known as: BACTROBAN Apply 1 Application topically 2 (two) times daily.   NovoLOG PenFill cartridge Generic drug: insulin aspart MAX DAILY DOSE 80 UNITS What changed:  how much to take how to take this when to take this   rizatriptan 10 MG disintegrating tablet Commonly known as: MAXALT-MLT Take 1 tablet (10 mg total) by mouth as needed for migraine. May repeat in 2 hours if needed   senna-docusate 8.6-50 MG tablet Commonly known as: Senokot-S Take by mouth.   Skin Tac Adhesive Barrier Wipe Misc 1 applicator by Does not apply route as directed.   spironolactone 100 MG tablet Commonly known as: ALDACTONE Take 1 tablet (100 mg total) by mouth daily.   Toujeo Max SoloStar 300 UNIT/ML Solostar Pen Generic drug: insulin glargine (2 Unit Dial) Inject 42 Units into the skin daily at 6 (six) AM. What changed: how much to take Changed by: Scarlette Shorts, MD   triamcinolone cream 0.5 % Commonly known as: KENALOG Apply 1 application topically 2 (two) times daily. To affected areas.   valACYclovir 500 MG tablet Commonly known as: VALTREX Take 1 tablet (500 mg total) by mouth daily.   Vitamin D (Ergocalciferol) 1.25 MG (50000 UNIT) Caps capsule Commonly known as: DRISDOL TAKE 1 CAPSULE BY MOUTH EVERY 7 DAYS   zolpidem 12.5 MG CR tablet Commonly known as: AMBIEN CR TAKE ONE TABLET BY MOUTH ONE TIME DAILY AT BEDTIME AS NEEDED FOR SLEEP What changed:  how much to take how to take this when to take this reasons to take this   zolpidem 10 MG tablet Commonly known as: Ambien Take 1 tablet (10 mg total) by mouth at bedtime as needed for sleep. What changed: Another medication with the same name was changed.  Make sure you understand how and when to take each.         OBJECTIVE:   Vital  Signs: BP 118/70   Pulse (!) 116   Ht 5' 7.5" (1.715 m)   Wt 222 lb 3.2 oz (100.8 kg)   SpO2 96%   BMI 34.29 kg/m   Wt Readings from Last 3 Encounters:  03/31/23 222 lb 3.2 oz (100.8 kg)  02/17/23 222 lb 3.2 oz (100.8 kg)  02/14/23 224 lb (101.6 kg)     Exam: General: Pt appears well and is in NAD  Lungs: Clear with good BS bilat  Heart: RRR   Neuro: MS is good with appropriate affect, pt is alert and Ox3     DATA REVIEWED:  Lab Results  Component Value Date   HGBA1C 9.1 (A) 03/31/2023   HGBA1C 10.5 (A) 01/17/2023   HGBA1C 10.6 (A) 09/06/2022    Latest Reference Range & Units 03/31/23 13:41  Sodium 135 - 145 mEq/L 133 (L)  Potassium 3.5 - 5.1 mEq/L 3.7  Chloride 96 - 112 mEq/L 96  CO2 19 - 32 mEq/L 27  Glucose 70 - 99 mg/dL 161 (H)  BUN 6 - 23 mg/dL 28 (H)  Creatinine 0.96 - 1.20 mg/dL 0.45 (H)  Calcium 8.4 - 10.5 mg/dL 40.9  GFR >81.19 mL/min 39.52 (L)    Latest Reference Range & Units 03/31/23 13:41  Total CHOL/HDL Ratio  3  Cholesterol 0 - 200 mg/dL 147  HDL Cholesterol >82.95 mg/dL 62.13  LDL (calc) 0 - 99 mg/dL 53  NonHDL  08.65  Triglycerides 0.0 - 149.0 mg/dL 784.6 (H)  VLDL 0.0 - 96.2 mg/dL 95.2  (H): Data is abnormally high    ASSESSMENT / PLAN / RECOMMENDATIONS:   1) Type 2 Diabetes Mellitus, poorly controlled , With neuropathic and macrovascular  complications - Most recent A1c of 9.1 %. Goal A1c < 7.0 %.     -Her A1c has trended down from 10.5% to 9.1%, which is a progress for her, but she understands this is still above goal of 7.0%  -I have given her contact information to the tandem pump last time, but she forgot to call them, she was encouraged to contact them if she still wants to pursue an insulin pump -She continues to imperfect adherence of NovoLog -She is tolerating Mounjaro, will increase -Intolerant to [place] due to recurrent genital infections  MEDICATIONS: -Continue Toujeo 42 units once daily  - Take Novolog to 20 units  TIDQAC - Increase  Mounjaro 7.5 mg weekly - Correction factor : Novolog (BG-130/20) TID    EDUCATION / INSTRUCTIONS: BG monitoring instructions: Patient is instructed to check her blood sugars 4 times a day, before meals and bedtime . Call [facility] if: BG persistently < 70  I reviewed the Rule of 15 for the treatment of hypoglycemia in detail with the patient. Literature supplied.    2) Diabetic complications:  Eye: Does not have known diabetic retinopathy.  Neuro/ Feet: Does have known diabetic peripheral neuropathy .  Renal: Patient does not have known baseline CKD. She   is  on an ACEI/ARB at present.    3)Dyslipidemia:  -LDL at goal -TG slightly elevated, but this is probably due to nonfasting  Medication Continue atorvastatin 40 mg daily    4) Low GFR:  -Patient will encouraged to increase hydration -Avoid NSAIDs -Encouraged optimizing glucose -Will continue to monitor -If GFR remains low, will consider ARB/ACE inhibitor  F/U in 3 months  Signed electronically by: Lyndle Herrlich, MD  [facility]  [facility] [address], [address] [place], [place] [address] Phone: [phone] FAX: [phone]   CC: Christen Butter, NP [address] [place] [address] Phone: [phone]  Fax: [phone]  Return to Endocrinology clinic as below: Future Appointments  Date Time Provider Department Center  04/07/2023  3:15 PM Vivi Barrack, [place] [facility]  04/18/2023  1:40 PM Christen Butter, NP [facility] None  05/13/2023  2:20 PM Georgeanna Lea, MD [facility] None  10/03/2023  2:20 PM Gerson Fauth, Konrad Dolores, MD [facility] None  02/23/2024  2:00 PM Butch Penny, NP [facility] None

## 2023-03-31 NOTE — Telephone Encounter (Signed)
This patient saw Dr Lonzo Cloud today and then went to the pharmacy to pick up Toujeo and N-Pen. Pharmacy did not have any and per patient she was told anytime the pharmacy did not have her medicine to come back to the office to see if we had samples

## 2023-04-01 ENCOUNTER — Other Ambulatory Visit: Payer: Self-pay

## 2023-04-01 DIAGNOSIS — N179 Acute kidney failure, unspecified: Secondary | ICD-10-CM | POA: Insufficient documentation

## 2023-04-05 ENCOUNTER — Encounter: Payer: Self-pay | Admitting: Internal Medicine

## 2023-04-07 ENCOUNTER — Encounter: Payer: Self-pay | Admitting: Internal Medicine

## 2023-04-07 ENCOUNTER — Other Ambulatory Visit: Payer: Self-pay

## 2023-04-07 ENCOUNTER — Ambulatory Visit: Payer: Managed Care, Other (non HMO) | Admitting: Podiatry

## 2023-04-07 MED ORDER — DEXCOM G6 SENSOR MISC
3 refills | Status: DC
  Filled 2023-04-07 – 2023-04-14 (×2): qty 3, 30d supply, fill #0
  Filled 2023-04-16 – 2023-05-25 (×3): qty 3, 30d supply, fill #1
  Filled 2023-06-21: qty 3, 30d supply, fill #2
  Filled 2023-07-26 – 2023-08-18 (×2): qty 3, 30d supply, fill #3
  Filled 2023-09-30: qty 3, 30d supply, fill #4

## 2023-04-12 ENCOUNTER — Other Ambulatory Visit: Payer: Self-pay | Admitting: Medical-Surgical

## 2023-04-12 MED ORDER — ATORVASTATIN CALCIUM 40 MG PO TABS: 40.0000 mg | ORAL_TABLET | Freq: Every day | ORAL | 3 refills | Status: DC

## 2023-04-12 MED ORDER — VALACYCLOVIR HCL 500 MG PO TABS: 500.0000 mg | ORAL_TABLET | Freq: Every day | ORAL | 0 refills | Status: DC

## 2023-04-13 ENCOUNTER — Other Ambulatory Visit: Payer: Self-pay | Admitting: Medical-Surgical

## 2023-04-13 ENCOUNTER — Other Ambulatory Visit: Payer: Self-pay

## 2023-04-14 ENCOUNTER — Other Ambulatory Visit: Payer: Self-pay | Admitting: Medical-Surgical

## 2023-04-14 ENCOUNTER — Other Ambulatory Visit: Payer: Self-pay

## 2023-04-18 ENCOUNTER — Other Ambulatory Visit: Payer: Self-pay

## 2023-04-18 ENCOUNTER — Encounter: Payer: Self-pay | Admitting: Medical-Surgical

## 2023-04-18 ENCOUNTER — Ambulatory Visit (INDEPENDENT_AMBULATORY_CARE_PROVIDER_SITE_OTHER): Payer: Managed Care, Other (non HMO) | Admitting: Medical-Surgical

## 2023-04-18 VITALS — BP 115/69 | HR 112 | Ht 67.5 in | Wt 219.4 lb

## 2023-04-18 DIAGNOSIS — E1129 Type 2 diabetes mellitus with other diabetic kidney complication: Secondary | ICD-10-CM

## 2023-04-18 DIAGNOSIS — N1831 Chronic kidney disease, stage 3a: Secondary | ICD-10-CM | POA: Diagnosis not present

## 2023-04-18 DIAGNOSIS — E1169 Type 2 diabetes mellitus with other specified complication: Secondary | ICD-10-CM

## 2023-04-18 DIAGNOSIS — J0100 Acute maxillary sinusitis, unspecified: Secondary | ICD-10-CM

## 2023-04-18 DIAGNOSIS — G4700 Insomnia, unspecified: Secondary | ICD-10-CM

## 2023-04-18 DIAGNOSIS — E1159 Type 2 diabetes mellitus with other circulatory complications: Secondary | ICD-10-CM

## 2023-04-18 DIAGNOSIS — Z23 Encounter for immunization: Secondary | ICD-10-CM | POA: Diagnosis not present

## 2023-04-18 DIAGNOSIS — E1142 Type 2 diabetes mellitus with diabetic polyneuropathy: Secondary | ICD-10-CM

## 2023-04-18 DIAGNOSIS — Z794 Long term (current) use of insulin: Secondary | ICD-10-CM

## 2023-04-18 DIAGNOSIS — E785 Hyperlipidemia, unspecified: Secondary | ICD-10-CM

## 2023-04-18 DIAGNOSIS — R809 Proteinuria, unspecified: Secondary | ICD-10-CM

## 2023-04-18 DIAGNOSIS — M542 Cervicalgia: Secondary | ICD-10-CM | POA: Diagnosis not present

## 2023-04-18 DIAGNOSIS — I152 Hypertension secondary to endocrine disorders: Secondary | ICD-10-CM

## 2023-04-18 DIAGNOSIS — H6121 Impacted cerumen, right ear: Secondary | ICD-10-CM

## 2023-04-18 MED ORDER — FUROSEMIDE 20 MG PO TABS: 20.0000 mg | ORAL_TABLET | Freq: Every day | ORAL | 3 refills | Status: DC

## 2023-04-18 MED ORDER — SPIRONOLACTONE 100 MG PO TABS: 100.0000 mg | ORAL_TABLET | Freq: Every day | ORAL | 3 refills | Status: DC

## 2023-04-18 MED ORDER — AZITHROMYCIN 250 MG PO TABS: ORAL_TABLET | ORAL | 0 refills | Status: AC

## 2023-04-18 MED ORDER — ZOLPIDEM TARTRATE 10 MG PO TABS: 10.0000 mg | ORAL_TABLET | Freq: Every evening | ORAL | 1 refills | Status: DC | PRN

## 2023-04-18 MED ORDER — BUPROPION HCL ER (XL) 150 MG PO TB24: 150.0000 mg | ORAL_TABLET | ORAL | 3 refills | Status: DC

## 2023-04-18 MED ORDER — VALACYCLOVIR HCL 500 MG PO TABS: 500.0000 mg | ORAL_TABLET | Freq: Every day | ORAL | 3 refills | Status: DC

## 2023-04-18 MED ORDER — RIZATRIPTAN BENZOATE 10 MG PO TBDP: 10.0000 mg | ORAL_TABLET | ORAL | 3 refills | Status: DC | PRN

## 2023-04-18 NOTE — Progress Notes (Signed)
Established patient visit  History, exam, impression, and plan:  1. Type 2 diabetes mellitus with diabetic polyneuropathy, with long-term current use of insulin (HCC) Managed by endocrinology.  2. Hypertension associated with diabetes (HCC) Currently taking Toprol-XL 25 mg daily, tolerating well without side effects.  Had a long-term monitor completed with cardiology but has not heard back from this regarding her tachycardia.  We did briefly review the report that was available showing a baseline rhythm of normal sinus but would like for her to reach out to cardiology for any recommendations from them.  3. Hyperlipidemia associated with type 2 diabetes mellitus (HCC) Currently taking Lipitor 40 mg daily, tolerating well without side effects.  Following a low-fat heart healthy diet.  Activity as tolerated.  Recent labs updated and reviewed.  Continue Lipitor.  4. Microalbuminuria due to type 2 diabetes mellitus (HCC) Managed by endocrinology.  5. Chronic renal failure, stage 3a (HCC) Recent renal function indicates likely dehydration with mild worsening of BUN, creatinine, and GFR.  She is actively working on rehydration and is avoiding nephrotoxic medications.  6. Insomnia, unspecified type Continues to use Ambien nightly for sleep.  Previously on the continued release 12.5 mg dose however this has not been available at her pharmacy.  Using the 10 mg nightly dose now which seems to work well.  Has been on this medication long-term and notes that she is unable to sleep without it.  Refilling Ambien today.  7. Neck pain Has had some issues with neck stiffness and pain and feels like it needs to pop.  Would like to see a chiropractor.  Referral placed. - Ambulatory referral to Chiropractic  8. Impacted cerumen of right ear Notes that her right ear hearing has been a bit muffled recently.  On exam, a large amount of cerumen was visualized.  Irrigation completed today to remove  cerumen.  Patient tolerated well. - Ear Lavage  9. Acute non-recurrent maxillary sinusitis Has had approximately 2 weeks of sinus issues including postnasal drip, sinus congestion, facial pain/pressure, and a dry cough.  Feels that the area is in the back of her throat on the right side is a bit swollen and she is unable to clear the mucus.  Has had a headache affecting the right forehead that has been lingering and is different from her typical migraines.  Has tried Tylenol but this has not been helpful.  See below for physical exam.  Treating with azithromycin.  10. Need for shingles vaccine Shingrix No. 2 given in office today. - Zoster Recombinant (Shingrix )  Physical Exam Vitals reviewed.  Constitutional:      General: She is not in acute distress.    Appearance: Normal appearance. She is obese. She is not ill-appearing.  HENT:     Head: Normocephalic and atraumatic.     Right Ear: Ear canal and external ear normal. There is impacted cerumen.     Left Ear: Tympanic membrane, ear canal and external ear normal. There is no impacted cerumen.     Nose:     Right Sinus: Maxillary sinus tenderness and frontal sinus tenderness present.     Left Sinus: No maxillary sinus tenderness or frontal sinus tenderness.     Mouth/Throat:     Mouth: Mucous membranes are moist.     Pharynx: No posterior oropharyngeal erythema.  Eyes:     General: No scleral icterus.       Right eye: No discharge.  Left eye: No discharge.     Extraocular Movements: Extraocular movements intact.     Conjunctiva/sclera: Conjunctivae normal.     Pupils: Pupils are equal, round, and reactive to light.  Cardiovascular:     Rate and Rhythm: Normal rate and regular rhythm.     Pulses: Normal pulses.     Heart sounds: Normal heart sounds. No murmur heard.    No friction rub. No gallop.  Pulmonary:     Effort: Pulmonary effort is normal. No respiratory distress.     Breath sounds: Normal breath sounds. No  wheezing.  Musculoskeletal:     Cervical back: Neck supple.  Lymphadenopathy:     Cervical: No cervical adenopathy.  Skin:    General: Skin is warm and dry.  Neurological:     Mental Status: She is alert and oriented to person, place, and time.  Psychiatric:        Mood and Affect: Mood normal.        Behavior: Behavior normal.        Thought Content: Thought content normal.        Judgment: Judgment normal.   Procedures performed this visit: None.  Return in about 6 months (around 10/19/2023) for chronic disease follow up.  __________________________________ Thayer Ohm, DNP, APRN, FNP-BC Primary Care and Sports Medicine [facility] [place]

## 2023-04-19 ENCOUNTER — Other Ambulatory Visit: Payer: Self-pay

## 2023-04-22 ENCOUNTER — Telehealth: Payer: Self-pay

## 2023-04-22 MED ORDER — BUPROPION HCL ER (XL) 150 MG PO TB24: 150.0000 mg | ORAL_TABLET | ORAL | 3 refills | Status: DC

## 2023-04-22 NOTE — Telephone Encounter (Signed)
Resent to the mail order pharmacy with clarification/correction.

## 2023-04-22 NOTE — Telephone Encounter (Signed)
Per [facility] - bupropion 150 mg rx has conflicting directions. The current rx states -  "Take 1 tablet (150 mg total) by mouth every morning. Take with 150mg  dose to equal 450mg  daily".  Pt is on bupropion 150 mg & 300 mg.  Mail order pharmacy requesting an updated rx. Thanks in advance.

## 2023-04-25 ENCOUNTER — Other Ambulatory Visit: Payer: Self-pay

## 2023-04-25 ENCOUNTER — Other Ambulatory Visit: Payer: Self-pay | Admitting: Medical-Surgical

## 2023-04-25 NOTE — Telephone Encounter (Signed)
Patient called and ask if her all medications can be sent to [facility] on [address] [place] phone number is [phone]

## 2023-04-26 ENCOUNTER — Other Ambulatory Visit: Payer: Self-pay

## 2023-04-26 DIAGNOSIS — R21 Rash and other nonspecific skin eruption: Secondary | ICD-10-CM

## 2023-04-26 DIAGNOSIS — L732 Hidradenitis suppurativa: Secondary | ICD-10-CM

## 2023-04-26 MED ORDER — BUPROPION HCL ER (XL) 300 MG PO TB24: 300.0000 mg | ORAL_TABLET | Freq: Every day | ORAL | 3 refills | Status: DC

## 2023-04-26 MED ORDER — AMITRIPTYLINE HCL 50 MG PO TABS: 50.0000 mg | ORAL_TABLET | Freq: Every day | ORAL | 1 refills | Status: DC

## 2023-04-26 MED ORDER — FUROSEMIDE 20 MG PO TABS: 20.0000 mg | ORAL_TABLET | Freq: Every day | ORAL | 3 refills | Status: DC

## 2023-04-26 MED ORDER — SPIRONOLACTONE 100 MG PO TABS: 100.0000 mg | ORAL_TABLET | Freq: Every day | ORAL | 3 refills | Status: AC

## 2023-04-26 MED ORDER — CLINDAMYCIN PHOSPHATE 1 % EX SWAB
1.00 | Freq: Two times a day (BID) | CUTANEOUS | 1 refills | Status: AC
Start: 2023-04-26 — End: ?

## 2023-04-26 MED ORDER — TRIAMCINOLONE ACETONIDE 0.5 % EX CREA
1.00 | TOPICAL_CREAM | Freq: Two times a day (BID) | CUTANEOUS | 3 refills | Status: AC
Start: 2023-04-26 — End: ?

## 2023-04-26 MED ORDER — VALACYCLOVIR HCL 500 MG PO TABS: 500.0000 mg | ORAL_TABLET | Freq: Every day | ORAL | 3 refills | Status: DC

## 2023-04-26 MED ORDER — ZOLPIDEM TARTRATE 10 MG PO TABS: 10.0000 mg | ORAL_TABLET | Freq: Every evening | ORAL | 1 refills | Status: DC | PRN

## 2023-04-26 MED ORDER — LEVOCETIRIZINE DIHYDROCHLORIDE 5 MG PO TABS: 5.0000 mg | ORAL_TABLET | Freq: Every evening | ORAL | 1 refills | Status: DC

## 2023-04-26 MED ORDER — BUPROPION HCL ER (XL) 150 MG PO TB24: 150.0000 mg | ORAL_TABLET | ORAL | 3 refills | Status: DC

## 2023-04-26 MED ORDER — ATORVASTATIN CALCIUM 40 MG PO TABS: 40.0000 mg | ORAL_TABLET | Freq: Every day | ORAL | 3 refills | Status: DC

## 2023-04-26 MED ORDER — DEXCOM G6 SENSOR MISC: 3 refills | Status: DC

## 2023-04-26 MED ORDER — METOPROLOL SUCCINATE ER 25 MG PO TB24: 25.0000 mg | ORAL_TABLET | Freq: Every day | ORAL | 3 refills | Status: DC

## 2023-04-26 MED ORDER — RIZATRIPTAN BENZOATE 10 MG PO TBDP: 10.0000 mg | ORAL_TABLET | ORAL | 3 refills | Status: DC | PRN

## 2023-04-26 NOTE — Telephone Encounter (Signed)
Sent to the requested pharmacy today

## 2023-04-27 ENCOUNTER — Other Ambulatory Visit: Payer: Self-pay

## 2023-04-28 ENCOUNTER — Other Ambulatory Visit (HOSPITAL_COMMUNITY): Payer: Self-pay

## 2023-04-29 ENCOUNTER — Other Ambulatory Visit (HOSPITAL_COMMUNITY): Payer: Self-pay

## 2023-04-29 ENCOUNTER — Encounter: Payer: Self-pay | Admitting: Podiatry

## 2023-04-29 ENCOUNTER — Ambulatory Visit (INDEPENDENT_AMBULATORY_CARE_PROVIDER_SITE_OTHER): Payer: Managed Care, Other (non HMO) | Admitting: Podiatry

## 2023-04-29 DIAGNOSIS — M2042 Other hammer toe(s) (acquired), left foot: Secondary | ICD-10-CM | POA: Diagnosis not present

## 2023-04-29 DIAGNOSIS — M19072 Primary osteoarthritis, left ankle and foot: Secondary | ICD-10-CM | POA: Diagnosis not present

## 2023-05-04 ENCOUNTER — Other Ambulatory Visit (HOSPITAL_COMMUNITY): Payer: Self-pay

## 2023-05-04 DIAGNOSIS — I479 Paroxysmal tachycardia, unspecified: Secondary | ICD-10-CM

## 2023-05-07 NOTE — Progress Notes (Signed)
Subjective: Chief Complaint  Patient presents with   Follow-up    3 month f/u for hammer toe surgery left foot    57 year old female presents the office today with above concerns she said that she is doing well not having any significant pain to her foot but she would like to proceed with cutting the tendon on the left second toe this is becoming more contracted and rubbing inside shoes.  No injuries or other concerns.  Objective: AAO x3, NAD DP/PT pulses palpable bilaterally, CRT less than 3 seconds Sensation decreased. No pain on left midfoot or ankle today.  Semirigid hammertoe contractures present mostly over the left second toe and there is irritation of the dorsal toe, evidence that she is.  There is no skin breakdown or warmth. No pain with calf compression, swelling, warmth, erythema  Assessment: 57 year old female with hammertoes bilaterally; arthritis  Plan: -All treatment options discussed with the patient including all alternatives, risks, complications.  -She want to proceed with a flexor tenotomy of the left second toe.  Discussed risks and complications of this and she understands and wishes to proceed.  Consent was signed.  I cleaned the skin with alcohol and extra 3 mL of lidocaine, Marcaine plain was infiltrated in a digital block fashion.  Once anesthetized I cleaned the skin with Betadine, alcohol.  I then introduced an 18-gauge needle plantar aspect of the toe to transect the 2nd toe flexor tendon.  At this time the toe was sitting in a more rectus position.  We irrigated with alcohol.  Pain is applied.  Postprocedure instructions discussed.  Monitor is under symptoms of infection. -From an arthritis/foot pain standpoint she seems to be doing well.  She denies any pain to the surgical site.  Discussed continued shoes, good arch support as well as stiffer soled shoe.  Return in about 2 weeks (around 05/13/2023) for flexor tenotomy follow-up.  Vivi Barrack DPM

## 2023-05-09 ENCOUNTER — Telehealth: Payer: Self-pay

## 2023-05-09 NOTE — Telephone Encounter (Signed)
-----   Message from Gypsy Balsam sent at 05/06/2023  1:53 PM EDT ----- Monitor showed svt, no need to treat unles sif symptomatic

## 2023-05-09 NOTE — Telephone Encounter (Signed)
LM to return my call. 

## 2023-05-12 ENCOUNTER — Other Ambulatory Visit (HOSPITAL_COMMUNITY): Payer: Self-pay

## 2023-05-12 LAB — HM DIABETES EYE EXAM

## 2023-05-13 ENCOUNTER — Ambulatory Visit: Payer: Managed Care, Other (non HMO) | Attending: Cardiology | Admitting: Cardiology

## 2023-05-13 ENCOUNTER — Encounter: Payer: Self-pay | Admitting: Cardiology

## 2023-05-13 VITALS — BP 112/60 | HR 104 | Ht 67.5 in | Wt 218.0 lb

## 2023-05-13 DIAGNOSIS — E1159 Type 2 diabetes mellitus with other circulatory complications: Secondary | ICD-10-CM | POA: Diagnosis not present

## 2023-05-13 DIAGNOSIS — I479 Paroxysmal tachycardia, unspecified: Secondary | ICD-10-CM

## 2023-05-13 DIAGNOSIS — E785 Hyperlipidemia, unspecified: Secondary | ICD-10-CM | POA: Diagnosis not present

## 2023-05-13 DIAGNOSIS — Z794 Long term (current) use of insulin: Secondary | ICD-10-CM

## 2023-05-13 DIAGNOSIS — I152 Hypertension secondary to endocrine disorders: Secondary | ICD-10-CM

## 2023-05-13 DIAGNOSIS — E1169 Type 2 diabetes mellitus with other specified complication: Secondary | ICD-10-CM

## 2023-05-13 MED ORDER — METOPROLOL SUCCINATE ER 50 MG PO TB24: 50.0000 mg | ORAL_TABLET | Freq: Every day | ORAL | 3 refills | Status: AC

## 2023-05-13 NOTE — Patient Instructions (Addendum)
Medication Instructions:   INCREASE: Metoprolol Succinate to 50mg  daily- you may double your current dose and your next refill will reflect your new dose   Lab Work: None Ordered If you have labs (blood work) drawn today and your tests are completely normal, you will receive your results only by: MyChart Message (if you have MyChart) OR A paper copy in the mail If you have any lab test that is abnormal or we need to change your treatment, we will call you to review the results.   Testing/Procedures: None Ordered   Follow-Up: At [facility], you and your health needs are our priority.  As part of our continuing mission to provide you with exceptional heart care, we have created designated Provider Care Teams.  These Care Teams include your primary Cardiologist (physician) and Advanced Practice Providers (APPs -  Physician Assistants and Nurse Practitioners) who all work together to provide you with the care you need, when you need it.  We recommend signing up for the patient portal called "MyChart".  Sign up information is provided on this After Visit Summary.  MyChart is used to connect with patients for Virtual Visits (Telemedicine).  Patients are able to view lab/test results, encounter notes, upcoming appointments, etc.  Non-urgent messages can be sent to your provider as well.   To learn more about what you can do with MyChart, go to [email or web address].    Your next appointment:   6 month(s)  The format for your next appointment:   In Person  Provider:   Gypsy Balsam, MD    Other Instructions NA

## 2023-05-13 NOTE — Progress Notes (Signed)
Cardiology Office Note:    Date:  05/13/2023   ID:  Helen Perez, DOB 1966/07/04, MRN [id number]  PCP:  Christen Butter, NP  Cardiologist:  Gypsy Balsam, MD    Referring MD: Christen Butter, NP   Chief Complaint  Patient presents with   Follow-up    History of Present Illness:    Helen Perez is a 57 y.o. female with past medical history significant for diabetes which is poorly controlled, depression, anxiety, essential hypertension, she was noted to have persistent sinus tachycardia quite extensive evaluation has been for performed which included echocardiogram showing preserved ejection fraction, stress test showing no evidence of ischemia, she also wore monitor which did not show any significant arrhythmia except sinus tachycardia.  She comes today to my office for follow-up.  Overall she is doing fine denies have any chest pain tightness squeezing pressure burning chest  Past Medical History:  Diagnosis Date   Anxiety    Depression    Diabetes mellitus without complication (HCC)    H/O syphilis    HSV-2 infection    Hypertension associated with diabetes (HCC) 03/30/2018   Renal insufficiency 10/13/2018   Sleep apnea    CPAP   Stroke [facility])     Past Surgical History:  Procedure Laterality Date   ABDOMINAL HYSTERECTOMY     FOOT SURGERY Right    FOOT SURGERY Left     Current Medications: Current Meds  Medication Sig   acetaminophen (TYLENOL) 650 MG CR tablet Take 1 tablet by mouth as needed for pain or fever.   acyclovir ointment (ZOVIRAX) 5 % Apply 1 Application topically every 3 (three) hours.   AMBULATORY NON FORMULARY MEDICATION Continuous positive airway pressure (CPAP) machine set on AutoPAP (4-20 cmH2O), with all supplemental supplies as needed. (Patient taking differently: 1 each by Other route See admin instructions. Continuous positive airway pressure (CPAP) machine set on AutoPAP (4-20 cmH2O), with all supplemental supplies as needed.)   amitriptyline (ELAVIL)  50 MG tablet Take 1 tablet (50 mg total) by mouth at bedtime.   aspirin EC 81 MG tablet Take 1 tablet by mouth daily.   atorvastatin (LIPITOR) 40 MG tablet Take 1 tablet (40 mg total) by mouth daily.   buPROPion (WELLBUTRIN XL) 150 MG 24 hr tablet Take 1 tablet (150 mg total) by mouth every morning. Take with 300mg  dose to equal 450mg  daily   buPROPion (WELLBUTRIN XL) 300 MG 24 hr tablet Take 1 tablet (300 mg total) by mouth daily. Take with 150mg  dose to equal 450mg  daily   ciclopirox (PENLAC) 8 % solution Apply topically at bedtime. Apply over nail and surrounding skin. Apply daily over previous coat. After seven (7) days, may remove with alcohol and continue cycle. (Patient taking differently: Apply 1 application  topically at bedtime. Apply over nail and surrounding skin. Apply daily over previous coat. After seven (7) days, may remove with alcohol and continue cycle.)   clindamycin (CLEOCIN T) 1 % SWAB Apply 1 Application topically 2 (two) times daily. Apply to the affected areas in the axilla and groin.   clotrimazole-betamethasone (LOTRISONE) cream APPLY TO THE AFFECTED AREA(S) 2 TIMES A DAY (Patient taking differently: Apply 1 Application topically 2 (two) times daily.)   Continuous Glucose Sensor (DEXCOM G6 SENSOR) MISC Change every 10 days (Patient taking differently: 1 each by Other route daily. Change every 10 days)   Continuous Glucose Sensor (DEXCOM G7 SENSOR) MISC Use as directed   doxycycline (VIBRAMYCIN) 100 MG capsule Take 100 mg by mouth  daily.   fexofenadine-pseudoephedrine (ALLEGRA-D 24) 180-240 MG 24 hr tablet Take 1 tablet by mouth daily as needed (allergies).   furosemide (LASIX) 20 MG tablet Take 1 tablet (20 mg total) by mouth daily. See instructions   injection device for insulin (INPEN 100-BLUE-NOVOLOG-FIASP) DEVI USE 1 DEVICE BY OTHER ROUTE ONCE FOR 1 DOSE (Patient taking differently: 1 Device by Other route once.)   insulin aspart (NOVOLOG PENFILL) cartridge Inject up to  80 Units into the skin daily as directed.   insulin glargine, 2 Unit Dial, (TOUJEO MAX SOLOSTAR) 300 UNIT/ML Solostar Pen Inject 42 Units into the skin daily at 6 (six) AM.   Insulin Pen Needle 31G X 5 MM MISC Use as directed in the morning, at noon, in the evening, and at bedtime. Use to inject Novolog insulin up to 4 times daily and Toujeo insulin once daily.   levocetirizine (XYZAL) 5 MG tablet Take 1 tablet (5 mg total) by mouth every evening.   metoprolol succinate (TOPROL-XL) 25 MG 24 hr tablet Take 1 tablet (25 mg total) by mouth daily.   mupirocin ointment (BACTROBAN) 2 % Apply 1 Application topically 2 (two) times daily.   Ostomy Supplies (SKIN TAC ADHESIVE BARRIER WIPE) MISC 1 applicator by Does not apply route as directed.   rizatriptan (MAXALT-MLT) 10 MG disintegrating tablet Take 1 tablet (10 mg total) by mouth as needed for migraine. May repeat in 2 hours if needed   senna-docusate (SENOKOT-S) 8.6-50 MG tablet Take 1 tablet by mouth at bedtime.   spironolactone (ALDACTONE) 100 MG tablet Take 1 tablet (100 mg total) by mouth daily.   tirzepatide (MOUNJARO) 7.5 MG/0.5ML Pen Inject 7.5 mg into the skin once a week.   triamcinolone cream (KENALOG) 0.5 % Apply 1 Application topically 2 (two) times daily. To affected areas.   valACYclovir (VALTREX) 500 MG tablet Take 1 tablet (500 mg total) by mouth daily.   Vitamin D, Ergocalciferol, (DRISDOL) 1.25 MG (50000 UNIT) CAPS capsule TAKE 1 CAPSULE BY MOUTH EVERY 7 DAYS (Patient taking differently: Take 50,000 Units by mouth every 7 (seven) days.)   zolpidem (AMBIEN) 10 MG tablet Take 1 tablet (10 mg total) by mouth at bedtime as needed for sleep.     Allergies:   Adhesive [tape], Latex, and Other   Social History   Socioeconomic History   Marital status: Single    Spouse name: Not on file   Number of children: 1   Years of education: Not on file   Highest education level: Some college, no degree  Occupational History   Not on file   Tobacco Use   Smoking status: Former    Current packs/day: 0.00    Types: Cigarettes    Quit date: 07/27/2012    Years since quitting: 10.8   Smokeless tobacco: Never  Vaping Use   Vaping status: Never Used  Substance and Sexual Activity   Alcohol use: Yes    Comment: 2-3 drinks/month, wine liquor   Drug use: No   Sexual activity: Yes    Partners: Male    Birth control/protection: Surgical  Other Topics Concern   Not on file  Social History Narrative   Not on file   Social Determinants of Health   Financial Resource Strain: Low Risk  (04/17/2023)   Overall Financial Resource Strain (CARDIA)    Difficulty of Paying Living Expenses: Not hard at all  Food Insecurity: No Food Insecurity (04/17/2023)   Hunger Vital Sign    Worried About Running Out  of Food in the Last Year: Never true    Ran Out of Food in the Last Year: Never true  Transportation Needs: No Transportation Needs (04/17/2023)   PRAPARE - Administrator, Civil Service (Medical): No    Lack of Transportation (Non-Medical): No  Physical Activity: Unknown (04/17/2023)   Exercise Vital Sign    Days of Exercise per Week: 0 days    Minutes of Exercise per Session: Not on file  Stress: No Stress Concern Present (04/17/2023)   [employer] of Occupational Health - Occupational Stress Questionnaire    Feeling of Stress : Not at all  Social Connections: Moderately Integrated (04/17/2023)   Social Connection and Isolation Panel [NHANES]    Frequency of Communication with Friends and Family: Twice a week    Frequency of Social Gatherings with Friends and Family: More than three times a week    Attends Religious Services: More than 4 times per year    Active Member of [employer] or Organizations: Yes    Attends Engineer, structural: More than 4 times per year    Marital Status: Never married     Family History: The patient's family history includes Diabetes in her brother, brother, maternal grandmother,  mother, and sister; Heart disease in her mother; Heart failure in her mother; Kidney failure in her brother, father, and sister; Other in her brother; Sleep apnea in her brother and sister. There is no history of Colon cancer, Esophageal cancer, Pancreatic cancer, Stomach cancer, or Liver disease. ROS:   Please see the history of present illness.    All 14 point review of systems negative except as described per history of present illness  EKGs/Labs/Other Studies Reviewed:         Recent Labs: 11/22/2022: TSH 0.69 03/31/2023: BUN 28; Creatinine, Ser 1.46; Potassium 3.7; Sodium 133  Recent Lipid Panel    Component Value Date/Time   CHOL 138 03/31/2023 1341   TRIG 167.0 (H) 03/31/2023 1341   HDL 51.30 03/31/2023 1341   CHOLHDL 3 03/31/2023 1341   VLDL 33.4 03/31/2023 1341   LDLCALC 53 03/31/2023 1341   LDLCALC 58 02/04/2021 0000    Physical Exam:    VS:  BP 112/60 (BP Location: Left Arm, Patient Position: Sitting)   Pulse (!) 104   Ht 5' 7.5" (1.715 m)   Wt 218 lb (98.9 kg)   BMI 33.64 kg/m     Wt Readings from Last 3 Encounters:  05/13/23 218 lb (98.9 kg)  04/18/23 219 lb 6.4 oz (99.5 kg)  03/31/23 222 lb 3.2 oz (100.8 kg)     GEN:  Well nourished, well developed in no acute distress HEENT: Normal NECK: No JVD; No carotid bruits LYMPHATICS: No lymphadenopathy CARDIAC: RRR, no murmurs, no rubs, no gallops RESPIRATORY:  Clear to auscultation without rales, wheezing or rhonchi  ABDOMEN: Soft, non-tender, non-distended MUSCULOSKELETAL:  No edema; No deformity  SKIN: Warm and dry LOWER EXTREMITIES: no swelling NEUROLOGIC:  Alert and oriented x 3 PSYCHIATRIC:  Normal affect   ASSESSMENT:    1. Paroxysmal tachycardia (HCC)   2. Hypertension associated with diabetes (HCC)   3. Hyperlipidemia associated with type 2 diabetes mellitus (HCC)   4. Type 2 diabetes mellitus with other circulatory complication, with long-term current use of insulin (HCC)   5. Dyslipidemia     PLAN:    In order of problems listed above:  Paroxysmal tachycardia most appropriately name inappropriate sinus tachycardia I will increase dose of beta-blocker she  takes metoprolol to succinate 25 daily will increase to 50 mg today.  Likely her left ventricle ejection fraction is normal, she does not have any hemodynamically significant coronary artery disease based on stress test being negative. Hypertension: Blood pressure well-controlled continue present management. Dyslipidemia I did review K PN which show me her LDL 53 HDL 51 she is on Lipitor 40 which I will continue. Diabetes poorly controlled last hemoglobin A1c 9.1.  She works hard with her primary care physician to improve it.   Medication Adjustments/Labs and Tests Ordered: Current medicines are reviewed at length with the patient today.  Concerns regarding medicines are outlined above.  No orders of the defined types were placed in this encounter.  Medication changes: No orders of the defined types were placed in this encounter.   Signed, Georgeanna Lea, MD, [facility] 05/13/2023 3:06 PM    [facility]

## 2023-05-16 ENCOUNTER — Ambulatory Visit (INDEPENDENT_AMBULATORY_CARE_PROVIDER_SITE_OTHER): Payer: Managed Care, Other (non HMO) | Admitting: Podiatry

## 2023-05-16 ENCOUNTER — Other Ambulatory Visit: Payer: Self-pay | Admitting: Medical-Surgical

## 2023-05-16 ENCOUNTER — Other Ambulatory Visit (HOSPITAL_COMMUNITY): Payer: Self-pay

## 2023-05-16 DIAGNOSIS — M2042 Other hammer toe(s) (acquired), left foot: Secondary | ICD-10-CM

## 2023-05-18 ENCOUNTER — Other Ambulatory Visit (HOSPITAL_COMMUNITY): Payer: Self-pay

## 2023-05-19 ENCOUNTER — Encounter: Payer: Self-pay | Admitting: Medical-Surgical

## 2023-05-19 ENCOUNTER — Other Ambulatory Visit: Payer: Self-pay

## 2023-05-20 ENCOUNTER — Other Ambulatory Visit: Payer: Self-pay

## 2023-05-20 ENCOUNTER — Telehealth: Payer: Self-pay

## 2023-05-20 ENCOUNTER — Other Ambulatory Visit (HOSPITAL_COMMUNITY): Payer: Self-pay

## 2023-05-20 NOTE — Telephone Encounter (Signed)
NovoLOG PenFill 100UNIT/ML cartridges Key: [id number]

## 2023-05-22 NOTE — Progress Notes (Signed)
Subjective: Chief Complaint  Patient presents with   Helen Perez    57 year old female presents the office today for follow-up evaluation after undergoing flexor tenotomy.  States that she is doing great she has no issues to her left foot.  No fever or chills.  Objective: AAO x3, NAD DP/PT pulses palpable bilaterally, CRT less than 3 seconds Sensation decreased. Second toe still more rectus on the left foot.  There is still some contracture noted at the DIPJ.  Hammertoes noted digits without any skin breakdown, callus formation or preulcerative lesions. No pain with calf compression, swelling, warmth, erythema  Assessment: 57 year old female with hammertoes   Plan: -All treatment options discussed with the patient including all alternatives, risks, complications.  -Procedure site is healed.  Procedure doing much better than left side.  Continue offloading pads.  Monitor for any reoccurrence.  If you are developing transfer lesions negative going to proceed with flexor tenotomy of the other digits. -Daily foot inspection, glucose control.   Return if symptoms worsen or fail to improve.  Vivi Barrack DPM

## 2023-05-25 ENCOUNTER — Other Ambulatory Visit: Payer: Self-pay

## 2023-05-26 ENCOUNTER — Other Ambulatory Visit: Payer: Self-pay

## 2023-05-27 ENCOUNTER — Other Ambulatory Visit: Payer: Self-pay

## 2023-05-27 ENCOUNTER — Encounter: Payer: Self-pay | Admitting: Medical-Surgical

## 2023-05-27 ENCOUNTER — Telehealth: Payer: Self-pay

## 2023-05-27 MED ORDER — HUMALOG KWIKPEN 200 UNIT/ML ~~LOC~~ SOPN
20.0000 [IU] | PEN_INJECTOR | Freq: Three times a day (TID) | SUBCUTANEOUS | 3 refills | Status: DC
  Filled 2023-05-27: qty 15, 50d supply, fill #0
  Filled 2023-06-19 – 2023-08-18 (×4): qty 15, 50d supply, fill #1
  Filled 2023-09-12 – 2023-11-04 (×2): qty 15, 50d supply, fill #2
  Filled 2023-12-05: qty 15, 50d supply, fill #3
  Filled 2023-12-07: qty 15, 25d supply, fill #3
  Filled 2024-02-06 – 2024-02-16 (×2): qty 15, 25d supply, fill #4
  Filled 2024-03-11: qty 15, 25d supply, fill #5
  Filled 2024-03-30 – 2024-04-29 (×2): qty 15, 25d supply, fill #6

## 2023-05-27 NOTE — Telephone Encounter (Signed)
[facility] pharmacy would like to see if patient can be switched to the Humalog  U200  Kwikpen. This is covered by insurance.

## 2023-05-30 ENCOUNTER — Other Ambulatory Visit: Payer: Self-pay

## 2023-05-30 ENCOUNTER — Other Ambulatory Visit (HOSPITAL_COMMUNITY): Payer: Self-pay

## 2023-05-30 NOTE — Telephone Encounter (Signed)
Found that Humalog Rx has been sent and is ready for pick up at the pharmacy.

## 2023-06-01 ENCOUNTER — Encounter: Payer: Self-pay | Admitting: Medical-Surgical

## 2023-06-16 ENCOUNTER — Encounter: Payer: Self-pay | Admitting: Medical-Surgical

## 2023-06-21 ENCOUNTER — Ambulatory Visit (INDEPENDENT_AMBULATORY_CARE_PROVIDER_SITE_OTHER): Payer: Managed Care, Other (non HMO) | Admitting: Medical-Surgical

## 2023-06-21 ENCOUNTER — Other Ambulatory Visit: Payer: Self-pay

## 2023-06-21 ENCOUNTER — Encounter: Payer: Self-pay | Admitting: Medical-Surgical

## 2023-06-21 ENCOUNTER — Other Ambulatory Visit: Payer: Self-pay | Admitting: Medical-Surgical

## 2023-06-21 ENCOUNTER — Other Ambulatory Visit (HOSPITAL_COMMUNITY): Payer: Self-pay

## 2023-06-21 VITALS — BP 134/85 | HR 95 | Ht 67.5 in | Wt 212.0 lb

## 2023-06-21 DIAGNOSIS — N179 Acute kidney failure, unspecified: Secondary | ICD-10-CM

## 2023-06-21 DIAGNOSIS — Z794 Long term (current) use of insulin: Secondary | ICD-10-CM | POA: Diagnosis not present

## 2023-06-21 DIAGNOSIS — E1142 Type 2 diabetes mellitus with diabetic polyneuropathy: Secondary | ICD-10-CM

## 2023-06-21 LAB — POCT URINALYSIS DIP (CLINITEK)
Bilirubin, UA: NEGATIVE
Blood, UA: NEGATIVE
Glucose, UA: 500 mg/dL — AB
Ketones, POC UA: NEGATIVE mg/dL
Leukocytes, UA: NEGATIVE
Nitrite, UA: NEGATIVE
POC PROTEIN,UA: NEGATIVE
Spec Grav, UA: 1.015 (ref 1.010–1.025)
Urobilinogen, UA: 0.2 U/dL
pH, UA: 6 (ref 5.0–8.0)

## 2023-06-21 LAB — POCT UA - MICROALBUMIN
Creatinine, POC: 50 mg/dL
Microalbumin Ur, POC: 30 mg/L

## 2023-06-21 MED ORDER — DEXCOM G6 TRANSMITTER MISC: 0 refills | Status: DC

## 2023-06-21 MED ORDER — DEXCOM G6 RECEIVER DEVI: 0 refills | Status: DC

## 2023-06-21 NOTE — Progress Notes (Signed)
        Established patient visit  History, exam, impression, and plan:  1. AKI (acute kidney injury) [facility]) Pleasant 57 year old female presenting today with reports of being dehydrated.  Notes that she has felt dehydrated over the last month or so and was told by her endocrinologist that she also noted signs of dehydration on her lab work.  Reports chronic dry mouth as well as very dry skin.  Some positional dizziness since noted that is accompanied with increase in heart rate.  Drinking approximately 3 16 ounce bottles of water daily.  Avoiding caffeinated beverages, tea, soda, juice, and coffee.  On evaluation, most recent labs showed mild AKI with GFR of 39, BUN of 28, and creatinine of 1.46.  Discussed possible contributors to her symptoms including medication side effects. Rechecking labs today. POCT UA showing normal specific gravity and glucose but otherwise normal. Microalbumin abnormal in the setting of uncontrolled diabetes. Push fluids, avoid caffeinated beverages. With reduction in kidney function, avoid NSAIDs.  - POCT UA - Microalbumin - CBC with Differential/Platelet - CMP14+EGFR - POCT URINALYSIS DIP (CLINITEK)  2. Type 2 diabetes mellitus with diabetic polyneuropathy, with long-term current use of insulin (HCC) POCT Microalbumin abnormal in the setting of uncontrolled diabetes. Managed by endocrinology but was unable to provide a specimen at her most recent visit. Forwarding results to Dr. Lonzo Cloud.  - POCT UA - Microalbumin   Procedures performed this visit: None.  Return if symptoms worsen or fail to improve.  __________________________________ Thayer Ohm, DNP, APRN, FNP-BC Primary Care and Sports Medicine [facility]

## 2023-06-22 ENCOUNTER — Other Ambulatory Visit: Payer: Self-pay

## 2023-06-22 LAB — CBC WITH DIFFERENTIAL/PLATELET
Basophils Absolute: 0 10*3/uL (ref 0.0–0.2)
Basos: 0 %
EOS (ABSOLUTE): 0.1 10*3/uL (ref 0.0–0.4)
Eos: 1 %
Hematocrit: 35.3 % (ref 34.0–46.6)
Hemoglobin: 10.8 g/dL — ABNORMAL LOW (ref 11.1–15.9)
Immature Grans (Abs): 0 10*3/uL (ref 0.0–0.1)
Immature Granulocytes: 0 %
Lymphocytes Absolute: 3.5 10*3/uL — ABNORMAL HIGH (ref 0.7–3.1)
Lymphs: 37 %
MCH: 26.6 pg (ref 26.6–33.0)
MCHC: 30.6 g/dL — ABNORMAL LOW (ref 31.5–35.7)
MCV: 87 fL (ref 79–97)
Monocytes Absolute: 0.5 10*3/uL (ref 0.1–0.9)
Monocytes: 6 %
Neutrophils Absolute: 5.3 10*3/uL (ref 1.4–7.0)
Neutrophils: 56 %
Platelets: 324 10*3/uL (ref 150–450)
RBC: 4.06 x10E6/uL (ref 3.77–5.28)
RDW: 12.7 % (ref 11.7–15.4)
WBC: 9.4 10*3/uL (ref 3.4–10.8)

## 2023-06-22 LAB — CMP14+EGFR
ALT: 36 IU/L — ABNORMAL HIGH (ref 0–32)
AST: 22 IU/L (ref 0–40)
Albumin: 4.2 g/dL (ref 3.8–4.9)
Alkaline Phosphatase: 155 IU/L — ABNORMAL HIGH (ref 44–121)
BUN/Creatinine Ratio: 18 (ref 9–23)
BUN: 24 mg/dL (ref 6–24)
Bilirubin Total: 0.3 mg/dL (ref 0.0–1.2)
CO2: 21 mmol/L (ref 20–29)
Calcium: 9.9 mg/dL (ref 8.7–10.2)
Chloride: 101 mmol/L (ref 96–106)
Creatinine, Ser: 1.32 mg/dL — ABNORMAL HIGH (ref 0.57–1.00)
Globulin, Total: 3.6 g/dL (ref 1.5–4.5)
Glucose: 184 mg/dL — ABNORMAL HIGH (ref 70–99)
Potassium: 4.7 mmol/L (ref 3.5–5.2)
Sodium: 138 mmol/L (ref 134–144)
Total Protein: 7.8 g/dL (ref 6.0–8.5)
eGFR: 47 mL/min/{1.73_m2} — ABNORMAL LOW (ref >59)

## 2023-06-22 NOTE — Telephone Encounter (Signed)
Requesting rx rf of  Fefxofenadine Cosentyx Fluconazole Sulfacetamide sodium  All under historical provider Last OV 0903/2024 Upcoming appt Oct 20, 2023.

## 2023-06-23 ENCOUNTER — Other Ambulatory Visit: Payer: Self-pay

## 2023-06-25 ENCOUNTER — Other Ambulatory Visit: Payer: Self-pay | Admitting: Medical-Surgical

## 2023-07-01 ENCOUNTER — Encounter: Payer: Self-pay | Admitting: Podiatry

## 2023-07-05 ENCOUNTER — Ambulatory Visit: Payer: Managed Care, Other (non HMO) | Admitting: Podiatry

## 2023-07-07 ENCOUNTER — Ambulatory Visit: Payer: Managed Care, Other (non HMO)

## 2023-07-07 ENCOUNTER — Ambulatory Visit (INDEPENDENT_AMBULATORY_CARE_PROVIDER_SITE_OTHER): Payer: Managed Care, Other (non HMO)

## 2023-07-07 ENCOUNTER — Ambulatory Visit (INDEPENDENT_AMBULATORY_CARE_PROVIDER_SITE_OTHER): Payer: Managed Care, Other (non HMO) | Admitting: Podiatry

## 2023-07-07 DIAGNOSIS — S92505A Nondisplaced unspecified fracture of left lesser toe(s), initial encounter for closed fracture: Secondary | ICD-10-CM

## 2023-07-07 DIAGNOSIS — R52 Pain, unspecified: Secondary | ICD-10-CM
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
## 2023-07-20 ENCOUNTER — Other Ambulatory Visit: Payer: Self-pay

## 2023-07-21 ENCOUNTER — Encounter: Payer: Managed Care, Other (non HMO) | Admitting: Podiatry

## 2023-07-21 ENCOUNTER — Encounter: Payer: Self-pay | Admitting: Podiatry

## 2023-07-21 VITALS — Ht 67.5 in | Wt 212.0 lb

## 2023-07-24 NOTE — Progress Notes (Signed)
No show

## 2023-07-26 ENCOUNTER — Other Ambulatory Visit: Payer: Self-pay | Admitting: Medical-Surgical

## 2023-07-27 ENCOUNTER — Other Ambulatory Visit: Payer: Self-pay

## 2023-07-28 ENCOUNTER — Ambulatory Visit: Payer: Managed Care, Other (non HMO) | Admitting: Podiatry

## 2023-07-28 ENCOUNTER — Ambulatory Visit (INDEPENDENT_AMBULATORY_CARE_PROVIDER_SITE_OTHER): Payer: Managed Care, Other (non HMO)

## 2023-07-28 DIAGNOSIS — S92505A Nondisplaced unspecified fracture of left lesser toe(s), initial encounter for closed fracture: Secondary | ICD-10-CM

## 2023-07-28 DIAGNOSIS — S93105D Unspecified dislocation of left toe(s), subsequent encounter: Secondary | ICD-10-CM

## 2023-07-28 NOTE — Progress Notes (Signed)
Subjective: No chief complaint on file.  57 year old female presents the office today for evaluation of dislocation of the left second toe.  She has continued to splint her toe.  No pain.  Still gets some swelling of the second toe but seems to be improving.  No new concerns.  Objective: AAO x3, NAD DP/PT pulses palpable bilaterally, CRT less than 3 seconds Second toes rectus.  There is still edema present to the toe but is no erythema or warmth.  Along the side of the toe there is a pressure spot noted however there is no skin breakdown otherwise.  Is no drainage or pus.  This appears to be a superficial pressure wound. No pain with calf compression, swelling, warmth, erythema  Assessment: Second toe dislocation  Plan: -All treatment options discussed with the patient including all alternatives, risks, complications.  -Repeat x-rays were obtained reviewed.  3 views of the foot were obtained.  No subacute fracture.  Toe is rectus. -I would continue to splint the toe several weeks.  I gave her a softer device to help splint the toe.  She is to monitor very closely the area of the pressure wound on the second toe for any skin breakdown any signs or symptoms of infection.  Remain in surgical shoe for the next 2 weeks.  At that time she can transition to regular shoe as tolerated. -We discussed that the toe read dislocates may need to proceed with surgical intervention.  For now we will continue with conservative treatment. -Patient encouraged to call the office with any questions, concerns, change in symptoms.    Vivi Barrack DPM

## 2023-08-17 ENCOUNTER — Other Ambulatory Visit: Payer: Self-pay | Admitting: Medical-Surgical

## 2023-08-18 ENCOUNTER — Other Ambulatory Visit (HOSPITAL_COMMUNITY): Payer: Self-pay

## 2023-08-18 ENCOUNTER — Other Ambulatory Visit: Payer: Self-pay

## 2023-08-19 ENCOUNTER — Other Ambulatory Visit: Payer: Self-pay

## 2023-08-29 ENCOUNTER — Ambulatory Visit: Payer: Managed Care, Other (non HMO) | Admitting: Podiatry

## 2023-09-05 ENCOUNTER — Other Ambulatory Visit: Payer: Self-pay | Admitting: Medical-Surgical

## 2023-09-05 ENCOUNTER — Other Ambulatory Visit: Payer: Self-pay

## 2023-09-06 ENCOUNTER — Other Ambulatory Visit: Payer: Self-pay

## 2023-09-07 ENCOUNTER — Other Ambulatory Visit: Payer: Self-pay

## 2023-09-07 ENCOUNTER — Other Ambulatory Visit (HOSPITAL_COMMUNITY): Payer: Self-pay

## 2023-09-08 ENCOUNTER — Other Ambulatory Visit: Payer: Self-pay

## 2023-09-12 ENCOUNTER — Other Ambulatory Visit: Payer: Self-pay

## 2023-09-12 ENCOUNTER — Other Ambulatory Visit: Payer: Self-pay | Admitting: Medical-Surgical

## 2023-09-12 NOTE — Telephone Encounter (Signed)
Last filled by a historical provider.

## 2023-09-13 ENCOUNTER — Other Ambulatory Visit: Payer: Self-pay

## 2023-09-30 ENCOUNTER — Ambulatory Visit (INDEPENDENT_AMBULATORY_CARE_PROVIDER_SITE_OTHER): Payer: Managed Care, Other (non HMO)

## 2023-09-30 ENCOUNTER — Other Ambulatory Visit: Payer: Self-pay

## 2023-09-30 ENCOUNTER — Ambulatory Visit (INDEPENDENT_AMBULATORY_CARE_PROVIDER_SITE_OTHER): Payer: Managed Care, Other (non HMO) | Admitting: Podiatry

## 2023-09-30 DIAGNOSIS — M216X2 Other acquired deformities of left foot: Secondary | ICD-10-CM | POA: Diagnosis not present

## 2023-09-30 DIAGNOSIS — M14672 Charcot's joint, left ankle and foot: Secondary | ICD-10-CM

## 2023-09-30 DIAGNOSIS — L84 Corns and callosities: Secondary | ICD-10-CM

## 2023-09-30 DIAGNOSIS — M778 Other enthesopathies, not elsewhere classified: Secondary | ICD-10-CM

## 2023-09-30 DIAGNOSIS — M216X1 Other acquired deformities of right foot: Secondary | ICD-10-CM | POA: Diagnosis not present

## 2023-09-30 DIAGNOSIS — E1149 Type 2 diabetes mellitus with other diabetic neurological complication: Secondary | ICD-10-CM

## 2023-10-03 ENCOUNTER — Other Ambulatory Visit: Payer: Self-pay

## 2023-10-03 ENCOUNTER — Ambulatory Visit (INDEPENDENT_AMBULATORY_CARE_PROVIDER_SITE_OTHER): Payer: Managed Care, Other (non HMO) | Admitting: Internal Medicine

## 2023-10-03 ENCOUNTER — Encounter: Payer: Self-pay | Admitting: Internal Medicine

## 2023-10-03 VITALS — BP 122/76 | HR 100 | Ht 67.5 in | Wt 223.0 lb

## 2023-10-03 DIAGNOSIS — Z794 Long term (current) use of insulin: Secondary | ICD-10-CM | POA: Diagnosis not present

## 2023-10-03 DIAGNOSIS — E1165 Type 2 diabetes mellitus with hyperglycemia: Secondary | ICD-10-CM

## 2023-10-03 LAB — POCT GLYCOSYLATED HEMOGLOBIN (HGB A1C): Hemoglobin A1C: 9.6 % — AB (ref 4.0–5.6)

## 2023-10-03 MED ORDER — GLIPIZIDE 10 MG PO TABS
10.0000 mg | ORAL_TABLET | Freq: Two times a day (BID) | ORAL | 3 refills | Status: DC
  Filled 2023-10-03: qty 180, 90d supply, fill #0
  Filled 2023-11-04 – 2023-12-28 (×3): qty 180, 90d supply, fill #1
  Filled 2024-03-16: qty 180, 90d supply, fill #2
  Filled 2024-03-30: qty 180, 90d supply, fill #3

## 2023-10-03 MED ORDER — TOUJEO MAX SOLOSTAR 300 UNIT/ML ~~LOC~~ SOPN
38.0000 [IU] | PEN_INJECTOR | Freq: Every day | SUBCUTANEOUS | 3 refills | Status: DC
  Filled 2023-10-03: qty 15, 118d supply, fill #0
  Filled 2023-10-13: qty 9, 71d supply, fill #0
  Filled 2023-11-04 – 2023-12-05 (×2): qty 9, 71d supply, fill #1
  Filled 2023-12-28 – 2024-02-16 (×2): qty 9, 71d supply, fill #2
  Filled 2024-03-16 – 2024-04-02 (×3): qty 9, 71d supply, fill #3

## 2023-10-03 MED ORDER — LOSARTAN POTASSIUM 25 MG PO TABS
25.0000 mg | ORAL_TABLET | Freq: Every day | ORAL | 3 refills | Status: DC
  Filled 2023-10-03: qty 90, 90d supply, fill #0
  Filled 2023-11-04 – 2023-12-28 (×3): qty 90, 90d supply, fill #1

## 2023-10-03 MED ORDER — TIRZEPATIDE 10 MG/0.5ML ~~LOC~~ SOAJ
10.0000 mg | SUBCUTANEOUS | 3 refills | Status: DC
  Filled 2023-10-03: qty 2, 28d supply, fill #0
  Filled 2023-11-04: qty 2, 28d supply, fill #1
  Filled 2023-12-05: qty 2, 28d supply, fill #2
  Filled 2023-12-28: qty 2, 28d supply, fill #3

## 2023-10-03 MED ORDER — ATORVASTATIN CALCIUM 40 MG PO TABS
40.0000 mg | ORAL_TABLET | Freq: Every day | ORAL | 3 refills | Status: DC
  Filled 2023-10-03: qty 90, 90d supply, fill #0
  Filled 2023-11-04 – 2023-12-28 (×3): qty 90, 90d supply, fill #1
  Filled 2024-03-16: qty 90, 90d supply, fill #2
  Filled 2024-03-30 – 2024-06-22 (×8): qty 90, 90d supply, fill #3

## 2023-10-03 NOTE — Progress Notes (Signed)
Name: Helen Perez  Age/ Sex: 57 y.o., female   MRN/ DOB: [id number], [birth year]     PCP: Christen Butter, NP   Reason for Endocrinology Evaluation: Type 2 Diabetes Mellitus  Initial Endocrine Consultative Visit: 01/30/2020    PATIENT IDENTIFIER: Helen Perez is a 57 y.o. female with a past medical history of HTN, TIA, OSA and T2DM. The patient has followed with Endocrinology clinic since 01/30/2020 for consultative assistance with management of her diabetes.  DIABETIC HISTORY:  Helen Perez was diagnosed with T2DM in 2000. She reports intolerance to Metformin.  She has been on an insulin pump for years.  Her hemoglobin A1c has ranged from 10.1% in 12/10/2019, peaking at 15.7% in 2016.  On her initial visit to our clinic her A1c was 12.5% , she was on Ozempic and novolog through insulin pump. We did not make any changes as she  Uses the pump only periodically as well as the Decoxm. Chronic hx of non-compliance, and was given the option to switch to MDI vs using the pump properly , she opted to continue with the pump at the time.     By 04/2020 we stopped insulin pump and started MDI regimen   Farxiga started 10/2020 and stopped by 02/2021 due to worsening genital infections  We switched Ozempic to [facility] 08/2022    SUBJECTIVE:   During the last visit (03/31/2023): A1c 9.1% .       Today (10/03/2023): Helen Perez is here for a follow up on diabetes  She checks her blood sugars multiple  times daily, through the dexcom. The patient has not had hypoglycemic episodes since the last clinic visit.  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, S/P left foot sx 11/2021, last seen 07/2023 Patient had a follow-up with cardiology for paroxysmal tachycardia, HTN, and dyslipidemia She had a follow-up with neurology for OSA   Denies nausea, vomiting Denies constipation or diarrhea    HOME DIABETES REGIMEN:  - Toujeo 42 units once daily  - Humalog 20 units TIDQAC  -Mounjaro 7.5  mg weekly - Humalog  (BG-130/20)     Statin: Yes ACE-I/ARB: yes     CONTINUOUS GLUCOSE MONITORING RECORD INTERPRETATION    Dates of Recording: 12/3-12/16/2024   Sensor description:Dexcom  Results statistics:   CGM use % of time 86%  Average and SD 231/83  Time in range 30  % Time Above 180 28  % Time above 250 42  % Time Below target 0       Glycemic patterns summary: BGs are high throughout the day and night  Hyperglycemic episodes postprandial  Hypoglycemic episodes occurred n/a  Overnight periods mostly high    INpen Report :no updated report      DIABETIC COMPLICATIONS: Microvascular complications:  Neuropathy Denies: retinopathy,  CKD Last eye exam: Completed 2021   Macrovascular complications:  CVA Denies: CAD, PVD    HISTORY:  Past Medical History:  Past Medical History:  Diagnosis Date   Anxiety    Depression    Diabetes mellitus without complication (HCC)    H/O syphilis    HSV-2 infection    Hypertension associated with diabetes (HCC) 03/30/2018   [name], left, sequela 11/06/2018   MRI results available in care everywhere/[facility]     Renal insufficiency 10/13/2018   Sleep apnea    CPAP   Stroke [facility])    Past Surgical History:  Past Surgical History:  Procedure Laterality Date   ABDOMINAL HYSTERECTOMY  FOOT SURGERY Right    FOOT SURGERY Left    Social History:  reports that she quit smoking about 11 years ago. Her smoking use included cigarettes. She has never used smokeless tobacco. She reports current alcohol use. She reports that she does not use drugs. Family History:  Family History  Problem Relation Age of Onset   Diabetes Mother    Heart disease Mother    Heart failure Mother    Kidney failure Father    Diabetes Sister    Kidney failure Sister    Sleep apnea Sister    Other Brother        quadraplegia   Diabetes Brother    Kidney failure Brother    Sleep apnea Brother     Diabetes Brother    Diabetes Maternal Grandmother    Colon cancer Neg Hx    Esophageal cancer Neg Hx    Pancreatic cancer Neg Hx    Stomach cancer Neg Hx    Liver disease Neg Hx      HOME MEDICATIONS: Allergies as of 10/03/2023       Reactions   Adhesive [tape]    From dexcom   Latex Itching, Swelling   Other Dermatitis   FREESTYLE LIBRE SENSOR- cellulitis, wound on skin         Medication List        Accurate as of October 03, 2023  2:29 PM. If you have any questions, ask your nurse or doctor.          STOP taking these medications    Dexcom G6 Receiver Devi Stopped by: Johnney Ou Fredderick Swanger   Dexcom G6 Transmitter Misc Stopped by: Johnney Ou Harman Ferrin   InPen 100-Blue-Novolog-Fiasp Devi Generic drug: injection device for insulin Stopped by: Johnney Ou Ersilia Brawley       TAKE these medications    acetaminophen 650 MG CR tablet Commonly known as: TYLENOL Take 1 tablet by mouth as needed for pain or fever.   acyclovir ointment 5 % Commonly known as: ZOVIRAX Apply 1 Application topically every 3 (three) hours.   AMBULATORY NON FORMULARY MEDICATION Continuous positive airway pressure (CPAP) machine set on AutoPAP (4-20 cmH2O), with all supplemental supplies as needed. What changed:  how much to take how to take this when to take this   amitriptyline 50 MG tablet Commonly known as: ELAVIL Take 1 tablet (50 mg total) by mouth at bedtime.   aspirin EC 81 MG tablet Take 1 tablet by mouth daily.   atorvastatin 40 MG tablet Commonly known as: LIPITOR Take 1 tablet (40 mg total) by mouth daily.   buPROPion 300 MG 24 hr tablet Commonly known as: Wellbutrin XL Take 1 tablet (300 mg total) by mouth daily. Take with 150mg  dose to equal 450mg  daily   buPROPion 150 MG 24 hr tablet Commonly known as: Wellbutrin XL Take 1 tablet (150 mg total) by mouth every morning. Take with 300mg  dose to equal 450mg  daily   clindamycin 1 % Swab Commonly known as:  CLEOCIN T Apply 1 Application topically 2 (two) times daily. Apply to the affected areas in the axilla and groin.   clotrimazole-betamethasone cream Commonly known as: LOTRISONE APPLY TO THE AFFECTED AREA(S) 2 TIMES A DAY What changed: See the new instructions.   Cosentyx UnoReady 300 MG/2ML Soaj Generic drug: Secukinumab Inject into the skin.   Dexcom G7 Sensor Misc Use as directed What changed: Another medication with the same name was removed. Continue taking this medication, and follow  the directions you see here. Changed by: Johnney Ou Masaru Chamberlin   doxycycline 100 MG capsule Commonly known as: VIBRAMYCIN Take 100 mg by mouth daily.   fexofenadine-pseudoephedrine 180-240 MG 24 hr tablet Commonly known as: ALLEGRA-D 24 Take 1 tablet by mouth daily as needed (allergies).   fluconazole 200 MG tablet Commonly known as: DIFLUCAN Take by mouth.   furosemide 20 MG tablet Commonly known as: LASIX Take 1 tablet (20 mg total) by mouth daily. See instructions   HumaLOG KwikPen 200 UNIT/ML KwikPen Generic drug: insulin lispro Inject 20 Units into the skin 3 (three) times daily.   levocetirizine 5 MG tablet Commonly known as: XYZAL TAKE ONE TABLET BY MOUTH EVERY EVENING   metoprolol succinate 50 MG 24 hr tablet Commonly known as: TOPROL-XL Take 1 tablet (50 mg total) by mouth daily. Take with or immediately following a meal.   Mounjaro 7.5 MG/0.5ML Pen Generic drug: tirzepatide Inject 7.5 mg into the skin once a week.   rizatriptan 10 MG disintegrating tablet Commonly known as: MAXALT-MLT Take 1 tablet (10 mg total) by mouth as needed for migraine. May repeat in 2 hours if needed   senna-docusate 8.6-50 MG tablet Commonly known as: Senokot-S Take 1 tablet by mouth at bedtime.   Skin Tac Adhesive Barrier Wipe Misc 1 applicator by Does not apply route as directed.   spironolactone 100 MG tablet Commonly known as: ALDACTONE Take 1 tablet (100 mg total) by mouth  daily.   Sulfacetamide Sodium 10 % Liqd Apply topically.   Toujeo Max SoloStar 300 UNIT/ML Solostar Pen Generic drug: insulin glargine (2 Unit Dial) Inject 42 Units into the skin daily at 6 (six) AM.   triamcinolone cream 0.5 % Commonly known as: KENALOG Apply 1 Application topically 2 (two) times daily. To affected areas.   valACYclovir 500 MG tablet Commonly known as: VALTREX Take 1 tablet (500 mg total) by mouth daily.   Vitamin D (Ergocalciferol) 1.25 MG (50000 UNIT) Caps capsule Commonly known as: DRISDOL TAKE 1 CAPSULE BY MOUTH EVERY 7 DAYS   zolpidem 10 MG tablet Commonly known as: Ambien Take 1 tablet (10 mg total) by mouth at bedtime as needed for sleep.         OBJECTIVE:   Vital Signs: BP 122/76 (BP Location: Left Arm, Patient Position: Sitting, Cuff Size: Large)   Pulse 100   Ht 5' 7.5" (1.715 m)   Wt 223 lb (101.2 kg)   SpO2 95%   BMI 34.41 kg/m   Wt Readings from Last 3 Encounters:  10/03/23 223 lb (101.2 kg)  07/21/23 212 lb (96.2 kg)  06/21/23 212 lb 0.6 oz (96.2 kg)     Exam: General: Pt appears well and is in NAD  Lungs: Clear with good BS bilat  Heart: RRR   Neuro: MS is good with appropriate affect, pt is alert and Ox3   Dm Foot Exam per podiatry 07/28/2023    DATA REVIEWED:  Lab Results  Component Value Date   HGBA1C 9.6 (A) 10/03/2023   HGBA1C 9.1 (A) 03/31/2023   HGBA1C 10.5 (A) 01/17/2023    Latest Reference Range & Units 06/21/23 10:18  Sodium 134 - 144 mmol/L 138  Potassium 3.5 - 5.2 mmol/L 4.7  Chloride 96 - 106 mmol/L 101  CO2 20 - 29 mmol/L 21  Glucose 70 - 99 mg/dL 161 (H)  BUN 6 - 24 mg/dL 24  Creatinine 0.96 - 0.45 mg/dL 4.09 (H)  Calcium 8.7 - 10.2 mg/dL 9.9  BUN/Creatinine Ratio  9 - 23  18  eGFR >59 mL/min/1.73 47 (L)  Alkaline Phosphatase 44 - 121 IU/L 155 (H)  Albumin 3.8 - 4.9 g/dL 4.2  AST 0 - 40 IU/L 22  ALT 0 - 32 IU/L 36 (H)  Total Protein 6.0 - 8.5 g/dL 7.8  Total Bilirubin 0.0 - 1.2 mg/dL 0.3    Old records , labs and images have been reviewed.    ASSESSMENT / PLAN / RECOMMENDATIONS:   1) Type 2 Diabetes Mellitus, poorly controlled , With neuropathic and macrovascular  complications - Most recent A1c of 9.6 %. Goal A1c < 7.0 %.     -A1c remains elevated -She will return for fasting glucose and C-peptide to proceed with the plan of pump -She continues to imperfect adherence to medication intake -She continues to be imperfect with taking Humalog with each meal, I have opted to start her on glipizide before breakfast and supper, she would not be on standing dose Humalog anymore as she does not take it most of the time, but she was encouraged to use correction scale before each meal if possible -In the past, I have asked her to contact tandem, but she forgot.  I have recommended ilet bionic pump, she will have to return for fasting glucose and C-peptide -Tolerating Mounjaro, will increase the dose as below -She has been noted with hypoglycemia overnight, will decrease Toujeo as below  MEDICATIONS: -Start glipizide 10 mg, 1 tablet twice daily -Increase Mounjaro 10 mg weekly -Decrease Toujeo 38 units once daily  - Correction factor : Humalog (BG-130/20) TID    EDUCATION / INSTRUCTIONS: BG monitoring instructions: Patient is instructed to check her blood sugars 4 times a day, before meals and bedtime . Call [facility] if: BG persistently < 70  I reviewed the Rule of 15 for the treatment of hypoglycemia in detail with the patient. Literature supplied.    2) Diabetic complications:  Eye: Does not have known diabetic retinopathy.  Neuro/ Feet: Does have known diabetic peripheral neuropathy .  Renal: Patient does not have known baseline CKD. She   is  on an ACEI/ARB at present.    3)Dyslipidemia:  -LDL at goal -TG slightly elevated, but this is probably due to nonfasting  Medication Continue atorvastatin 40 mg daily    4) CKD III/ Microalbuminuria    -GFR remains low -Patient recalls being on lisinopril, unknown reason for discontinuation  Medication Start losartan 25 mg daily    F/U in 3 months    Signed electronically by: Lyndle Herrlich, MD  [facility]  [facility] [address] [place] [place], [place] [address] Phone: [phone] FAX: [phone]   CC: Christen Butter, NP [address] [place] [address] Phone: [phone]  Fax: [phone]  Return to Endocrinology clinic as below: Future Appointments  Date Time Provider Department Center  10/20/2023  1:20 PM Christen Butter, NP [facility] None  10/28/2023 11:45 AM Vivi Barrack, DPM [facility]  02/23/2024  2:00 PM Butch Penny, NP [facility] None

## 2023-10-03 NOTE — Progress Notes (Signed)
Subjective: Chief Complaint  Patient presents with   Foot Pain    RM#11 Follow up on dislocation of left foot patient states still in much pain on pain scale is a 29.    57 year old female presents the office today for foot pain.  She states the pain is moved she points on submetatarsal 1 where she gets her discomfort mostly with standing.  She does not recall any injuries.  The toe that she had injured has been doing well.  No open lesions that she reports.  Objective: AAO x3, NAD DP/PT pulses palpable bilaterally, CRT less than 3 seconds Second toes rectus.  There is minimal edema still present in the toe but there is no erythema or any open lesions.  There is no pain in the toe.  The majority tenderness is submetatarsal 1 on the area sesamoids.  There is no open lesions to this area.  There is a preulcerative area and looks like a blister starting from the distal aspect of the hallux and hallux malleus is present.  There is no skin breakdown otherwise.  There is no fluctuation or crepitation.  No signs of infection. No pain with calf compression, swelling, warmth, erythema  Assessment: Second toe dislocation, healing; sesamoiditis, hallux malleus  Plan: -All treatment options discussed with the patient including all alternatives, risks, complications.  -Repeat x-rays were obtained reviewed.  3 views of the foot were obtained.  No evidence of acute fracture.  Digital deformities present. -I am concerned about the tip of the toe looks like a blister forming.  Dispensed offloading pad for this area.  Also dispensed a gel metatarsal pad.  I did use a benefit from custom orthotics which are accommodative long-term.  I did measure her today with inserts.  She is aware the cost wants to proceed. -Long-term without the benefit from hammertoe repair -Daily foot inspection with glucose control.  Return in about 4 weeks (around 10/28/2023).  Vivi Barrack DPM

## 2023-10-03 NOTE — Patient Instructions (Addendum)
-   Start Glipizide 10 mg, 1 tablet before Breakfast and Supper  -Increase Mounjaro 10 mg weekly  -Take Toujeo 38 units daily  - Humalog  correctional insulin:Use the scale below to help guide you BEFORE each meal   Blood sugar before meal Number of units to inject  Less than 150 0 unit  151 -  170 1 units  171 -  185 2 units  186 -  205 3 units  206 -  225 4 units  226 -  245 5 units  246 -  266 6 units  267 -  286 7 units  287 -  306 8 units  306 - 326 9 units       HOW TO TREAT LOW BLOOD SUGARS (Blood sugar LESS THAN 70 MG/DL) Please follow the RULE OF 15 for the treatment of hypoglycemia treatment (when your (blood sugars are less than 70 mg/dL)   STEP 1: Take 15 grams of carbohydrates when your blood sugar is low, which includes:  3-4 GLUCOSE TABS  OR 3-4 OZ OF JUICE OR REGULAR SODA OR ONE TUBE OF GLUCOSE GEL    STEP 2: RECHECK blood sugar in 15 MINUTES STEP 3: If your blood sugar is still low at the 15 minute recheck --> then, go back to STEP 1 and treat AGAIN with another 15 grams of carbohydrates.

## 2023-10-07 ENCOUNTER — Other Ambulatory Visit: Payer: Self-pay

## 2023-10-07 ENCOUNTER — Other Ambulatory Visit: Payer: Managed Care, Other (non HMO)

## 2023-10-07 DIAGNOSIS — Z794 Long term (current) use of insulin: Secondary | ICD-10-CM

## 2023-10-10 LAB — HM DIABETES EYE EXAM

## 2023-10-11 ENCOUNTER — Encounter (HOSPITAL_BASED_OUTPATIENT_CLINIC_OR_DEPARTMENT_OTHER): Payer: Self-pay | Admitting: Emergency Medicine

## 2023-10-11 ENCOUNTER — Other Ambulatory Visit: Payer: Self-pay

## 2023-10-11 ENCOUNTER — Emergency Department (HOSPITAL_BASED_OUTPATIENT_CLINIC_OR_DEPARTMENT_OTHER)
Admission: EM | Admit: 2023-10-11 | Discharge: 2023-10-11 | Disposition: A | Discharge status: Home / Self Care | Payer: Managed Care, Other (non HMO) | Attending: Emergency Medicine | Admitting: Emergency Medicine

## 2023-10-11 ENCOUNTER — Emergency Department (HOSPITAL_BASED_OUTPATIENT_CLINIC_OR_DEPARTMENT_OTHER): Payer: Managed Care, Other (non HMO)

## 2023-10-11 DIAGNOSIS — Z79899 Other long term (current) drug therapy: Secondary | ICD-10-CM | POA: Diagnosis not present

## 2023-10-11 DIAGNOSIS — Z7982 Long term (current) use of aspirin: Secondary | ICD-10-CM | POA: Insufficient documentation

## 2023-10-11 DIAGNOSIS — L089 Local infection of the skin and subcutaneous tissue, unspecified: Secondary | ICD-10-CM

## 2023-10-11 DIAGNOSIS — E119 Type 2 diabetes mellitus without complications: Secondary | ICD-10-CM | POA: Insufficient documentation

## 2023-10-11 DIAGNOSIS — Z91040 Latex allergy status: Secondary | ICD-10-CM | POA: Insufficient documentation

## 2023-10-11 DIAGNOSIS — Z7984 Long term (current) use of oral hypoglycemic drugs: Secondary | ICD-10-CM | POA: Insufficient documentation

## 2023-10-11 DIAGNOSIS — L03032 Cellulitis of left toe: Secondary | ICD-10-CM | POA: Insufficient documentation

## 2023-10-11 DIAGNOSIS — Z794 Long term (current) use of insulin: Secondary | ICD-10-CM | POA: Diagnosis not present

## 2023-10-11 DIAGNOSIS — M79675 Pain in left toe(s): Secondary | ICD-10-CM | POA: Diagnosis present

## 2023-10-11 LAB — CBC WITH DIFFERENTIAL/PLATELET
Abs Immature Granulocytes: 0.03 10*3/uL (ref 0.00–0.07)
Basophils Absolute: 0 10*3/uL (ref 0.0–0.1)
Basophils Relative: 0 %
Eosinophils Absolute: 0.1 10*3/uL (ref 0.0–0.5)
Eosinophils Relative: 1 %
HCT: 35.2 % — ABNORMAL LOW (ref 36.0–46.0)
Hemoglobin: 11 g/dL — ABNORMAL LOW (ref 12.0–15.0)
Immature Granulocytes: 0 %
Lymphocytes Relative: 30 %
Lymphs Abs: 2.8 10*3/uL (ref 0.7–4.0)
MCH: 26.8 pg (ref 26.0–34.0)
MCHC: 31.3 g/dL (ref 30.0–36.0)
MCV: 85.6 fL (ref 80.0–100.0)
Monocytes Absolute: 0.5 10*3/uL (ref 0.1–1.0)
Monocytes Relative: 5 %
Neutro Abs: 5.9 10*3/uL (ref 1.7–7.7)
Neutrophils Relative %: 64 %
Platelets: 342 10*3/uL (ref 150–400)
RBC: 4.11 MIL/uL (ref 3.87–5.11)
RDW: 13.3 % (ref 11.5–15.5)
WBC: 9.4 10*3/uL (ref 4.0–10.5)
nRBC: 0 % (ref 0.0–0.2)

## 2023-10-11 LAB — BASIC METABOLIC PANEL
Anion gap: 7 (ref 5–15)
BUN: 23 mg/dL — ABNORMAL HIGH (ref 6–20)
CO2: 21 mmol/L — ABNORMAL LOW (ref 22–32)
Calcium: 8.6 mg/dL — ABNORMAL LOW (ref 8.9–10.3)
Chloride: 103 mmol/L (ref 98–111)
Creatinine, Ser: 1.29 mg/dL — ABNORMAL HIGH (ref 0.44–1.00)
GFR, Estimated: 48 mL/min — ABNORMAL LOW (ref >60)
Glucose, Bld: 314 mg/dL — ABNORMAL HIGH (ref 70–99)
Potassium: 4.1 mmol/L (ref 3.5–5.1)
Sodium: 131 mmol/L — ABNORMAL LOW (ref 135–145)

## 2023-10-11 MED ORDER — SULFAMETHOXAZOLE-TRIMETHOPRIM 800-160 MG PO TABS: 1.0000 | ORAL_TABLET | Freq: Two times a day (BID) | ORAL | 0 refills | Status: AC

## 2023-10-11 MED ORDER — SULFAMETHOXAZOLE-TRIMETHOPRIM 800-160 MG PO TABS: 1.0000 | ORAL_TABLET | Freq: Once | ORAL | Status: DC

## 2023-10-11 MED ORDER — CEPHALEXIN 500 MG PO CAPS: 500.0000 mg | ORAL_CAPSULE | Freq: Two times a day (BID) | ORAL | 0 refills | Status: DC

## 2023-10-11 MED ORDER — PIPERACILLIN-TAZOBACTAM 3.375 G IVPB 30 MIN
3.3750 g | Freq: Once | INTRAVENOUS | Status: AC
  Administered 2023-10-11: 3.375 g via INTRAVENOUS
  Filled 2023-10-11: qty 50

## 2023-10-11 MED ORDER — CEPHALEXIN 250 MG PO CAPS: 500.0000 mg | ORAL_CAPSULE | Freq: Once | ORAL | Status: DC

## 2023-10-11 NOTE — Discharge Instructions (Signed)
Today you were seen for an infection of your left great toe.  Please pick up your antibiotic and take as prescribed.  Please follow-up with your podiatrist in the next week.  Thank you for letting us treat you today. After reviewing your labs and imaging, I feel you are safe to go home. Please follow up with your PCP in the next several days and provide them with your records from this visit. Return to the Emergency Room if pain becomes severe or symptoms worsen.

## 2023-10-11 NOTE — ED Provider Notes (Signed)
[facility] Provider Note   CSN: [id number] Arrival date & time: 10/11/23  1735     History  Chief Complaint  Patient presents with   Toe Pain    Helen Perez is a 57 y.o. female past medical history for diabetes presents today for possible left great toe infection.  Patient noticed yesterday that the toe was mildly swollen with discoloration.  Patient also noted some purulent drainage around the left toenail.  Patient denies fever, chills, injury, weakness, numbness, or pain.   Toe Pain       Home Medications Prior to Admission medications   Medication Sig Start Date End Date Taking? Authorizing Provider  cephALEXin (KEFLEX) 500 MG capsule Take 1 capsule (500 mg total) by mouth 2 (two) times daily. 10/11/23  Yes Dolphus Jenny, PA-C  sulfamethoxazole-trimethoprim (BACTRIM DS) 800-160 MG tablet Take 1 tablet by mouth 2 (two) times daily for 10 days. 10/11/23 10/21/23 Yes Dolphus Jenny, PA-C  acetaminophen (TYLENOL) 650 MG CR tablet Take 1 tablet by mouth as needed for pain or fever.    [provider]  acyclovir ointment (ZOVIRAX) 5 % Apply 1 Application topically every 3 (three) hours.    [provider]  AMBULATORY NON FORMULARY MEDICATION Continuous positive airway pressure (CPAP) machine set on AutoPAP (4-20 cmH2O), with all supplemental supplies as needed. Patient taking differently: 1 each by Other route See admin instructions. Continuous positive airway pressure (CPAP) machine set on AutoPAP (4-20 cmH2O), with all supplemental supplies as needed. 11/16/22   Christen Butter, NP  amitriptyline (ELAVIL) 50 MG tablet Take 1 tablet (50 mg total) by mouth at bedtime. 04/26/23   Christen Butter, NP  aspirin EC 81 MG tablet Take 1 tablet by mouth daily.    [provider]  atorvastatin (LIPITOR) 40 MG tablet Take 1 tablet (40 mg total) by mouth daily. 10/03/23   Shamleffer, Konrad Dolores, MD  buPROPion (WELLBUTRIN  XL) 150 MG 24 hr tablet Take 1 tablet (150 mg total) by mouth every morning. Take with 300mg  dose to equal 450mg  daily 04/26/23   Christen Butter, NP  buPROPion (WELLBUTRIN XL) 300 MG 24 hr tablet Take 1 tablet (300 mg total) by mouth daily. Take with 150mg  dose to equal 450mg  daily 04/26/23   Christen Butter, NP  clindamycin (CLEOCIN T) 1 % SWAB Apply 1 Application topically 2 (two) times daily. Apply to the affected areas in the axilla and groin. 04/26/23   Christen Butter, NP  clotrimazole-betamethasone (LOTRISONE) cream APPLY TO THE AFFECTED AREA(S) 2 TIMES A DAY Patient taking differently: Apply 1 Application topically 2 (two) times daily. 10/05/22   Vivi Barrack, DPM  Continuous Glucose Sensor (DEXCOM G7 SENSOR) MISC Use as directed 03/31/23   Shamleffer, Konrad Dolores, MD  COSENTYX UNOREADY 300 MG/2ML SOAJ Inject into the skin. 06/15/23   [provider]  fexofenadine-pseudoephedrine (ALLEGRA-D 24) 180-240 MG 24 hr tablet Take 1 tablet by mouth daily as needed (allergies).    [provider]  fluconazole (DIFLUCAN) 200 MG tablet Take by mouth. 05/27/23   [provider]  furosemide (LASIX) 20 MG tablet Take 1 tablet (20 mg total) by mouth daily. See instructions 04/26/23   Christen Butter, NP  glipiZIDE (GLUCOTROL) 10 MG tablet Take 1 tablet (10 mg total) by mouth 2 (two) times daily before a meal. 10/03/23   Shamleffer, Konrad Dolores, MD  insulin glargine, 2 Unit Dial, (TOUJEO MAX SOLOSTAR) 300 UNIT/ML Solostar Pen Inject 38  Units into the skin daily at 6 (six) AM. 10/03/23   Shamleffer, Konrad Dolores, MD  insulin lispro (HUMALOG KWIKPEN) 200 UNIT/ML KwikPen Inject 20 Units into the skin 3 (three) times daily. 05/27/23   Shamleffer, Konrad Dolores, MD  levocetirizine (XYZAL) 5 MG tablet TAKE ONE TABLET BY MOUTH EVERY EVENING 06/27/23   Christen Butter, NP  losartan (COZAAR) 25 MG tablet Take 1 tablet (25 mg total) by mouth daily. 10/03/23   Shamleffer, Konrad Dolores, MD  metoprolol  succinate (TOPROL-XL) 50 MG 24 hr tablet Take 1 tablet (50 mg total) by mouth daily. Take with or immediately following a meal. 05/13/23 08/11/23  Georgeanna Lea, MD  Ostomy Supplies (SKIN TAC ADHESIVE BARRIER WIPE) MISC 1 applicator by Does not apply route as directed. 01/30/20   Shamleffer, Konrad Dolores, MD  rizatriptan (MAXALT-MLT) 10 MG disintegrating tablet Take 1 tablet (10 mg total) by mouth as needed for migraine. May repeat in 2 hours if needed 04/26/23   Christen Butter, NP  senna-docusate (SENOKOT-S) 8.6-50 MG tablet Take 1 tablet by mouth at bedtime. 10/13/18 11/21/23  [provider]  spironolactone (ALDACTONE) 100 MG tablet Take 1 tablet (100 mg total) by mouth daily. 04/26/23   Christen Butter, NP  Sulfacetamide Sodium 10 % LIQD Apply topically. 06/01/23   [provider]  tirzepatide Greggory Keen) 10 MG/0.5ML Pen Inject 10 mg into the skin once a week. 10/03/23   Shamleffer, Konrad Dolores, MD  triamcinolone cream (KENALOG) 0.5 % Apply 1 Application topically 2 (two) times daily. To affected areas. 04/26/23   Christen Butter, NP  valACYclovir (VALTREX) 500 MG tablet Take 1 tablet (500 mg total) by mouth daily. 04/26/23   Christen Butter, NP  Vitamin D, Ergocalciferol, (DRISDOL) 1.25 MG (50000 UNIT) CAPS capsule TAKE 1 CAPSULE BY MOUTH EVERY 7 DAYS Patient taking differently: Take 50,000 Units by mouth every 7 (seven) days. 12/18/21   Vivi Barrack, DPM  zolpidem (AMBIEN) 10 MG tablet Take 1 tablet (10 mg total) by mouth at bedtime as needed for sleep. 04/26/23   Christen Butter, NP      Allergies    Adhesive [tape], Latex, and Other    Review of Systems   Review of Systems  Musculoskeletal:  Positive for arthralgias.  Skin:  Positive for color change.    Physical Exam Updated Vital Signs BP 114/72 (BP Location: Left Arm)   Pulse (!) 110   Temp 97.7 F (36.5 C)   Resp 18   SpO2 99%  Physical Exam Vitals and nursing note reviewed.  Constitutional:      General: She is not in  acute distress.    Appearance: She is well-developed.  HENT:     Head: Normocephalic and atraumatic.     Right Ear: External ear normal.     Left Ear: External ear normal.     Nose: Nose normal.     Mouth/Throat:     Mouth: Mucous membranes are moist.  Eyes:     Extraocular Movements: Extraocular movements intact.     Conjunctiva/sclera: Conjunctivae normal.  Cardiovascular:     Rate and Rhythm: Normal rate and regular rhythm.     Pulses: Normal pulses.     Heart sounds: Normal heart sounds. No murmur heard. Pulmonary:     Effort: Pulmonary effort is normal. No respiratory distress.     Breath sounds: Normal breath sounds.  Abdominal:     Palpations: Abdomen is soft.     Tenderness: There is no abdominal tenderness.  Musculoskeletal:     Cervical back: Normal range of motion and neck supple.     Comments: Mild swelling and whitish-yellow discoloration to the plantar aspect of the left great toe.  Patient is neurovascularly intact with +1 pedal pulses bilaterally.  No signs of gangrene, felon, or paronychia.  Patient does have decreased cap refill in her left great toe which appears to be chronic given her thickened toenail and history of diabetes.  Skin:    General: Skin is warm and dry.     Capillary Refill: Capillary refill takes less than 2 seconds.  Neurological:     General: No focal deficit present.     Mental Status: She is alert.     Motor: No weakness.  Psychiatric:        Mood and Affect: Mood normal.     ED Results / Procedures / Treatments   Labs (all labs ordered are listed, but only abnormal results are displayed) Labs Reviewed  BASIC METABOLIC PANEL - Abnormal; Notable for the following components:      Result Value   Sodium 131 (*)    CO2 21 (*)    Glucose, Bld 314 (*)    BUN 23 (*)    Creatinine, Ser 1.29 (*)    Calcium 8.6 (*)    GFR, Estimated 48 (*)    All other components within normal limits  CBC WITH DIFFERENTIAL/PLATELET - Abnormal; Notable  for the following components:   Hemoglobin 11.0 (*)    HCT 35.2 (*)    All other components within normal limits    EKG None  Radiology DG Toe Great Left Result Date: 10/11/2023 CLINICAL DATA:  Pain in the left great toe.  No recent injury. EXAM: LEFT GREAT TOE COMPARISON:  None Available. FINDINGS: Soft tissue gas is demonstrated in the subungual region suggesting infection. No radiopaque foreign bodies identified. No acute fracture or dislocation. No bone erosion or sclerosis to suggest osteomyelitis. Postoperative changes with fixation of the first and second metatarsal-phalangeal joints. IMPRESSION: Soft tissue gas in the subungual region of the left first toe suggesting soft tissue infection. No radiographic evidence of osteomyelitis. Electronically Signed   By: Burman Nieves M.D.   On: 10/11/2023 18:37    Procedures Procedures    Medications Ordered in ED Medications  piperacillin-tazobactam (ZOSYN) IVPB 3.375 g (0 g Intravenous Stopped 10/11/23 1925)    ED Course/ Medical Decision Making/ A&P                                 Medical Decision Making Amount and/or Complexity of Data Reviewed Labs: ordered. Radiology: ordered.   This patient presents to the ED with chief complaint(s) of left great toe pain with pertinent past medical history of diabetes which further complicates the presenting complaint. The complaint involves an extensive differential diagnosis and also carries with it a high risk of complications and morbidity.    The differential diagnosis includes osteomyelitis, cellulitis, ingrown toenail  Additional history obtained: Records reviewed podiatry notes  ED Course and Reassessment: Zosyn administered in ER  Independent labs interpretation:  The following labs were independently interpreted:  CBC: Mild anemia at 11.0 which is chronic per historical values BMP: Mild hyponatremia at 131, mildly decreased CO2 at 21, mildly elevated bun at 23, mildly  elevated creatinine at 1.29 which is chronic per historical values, mildly decreased calcium at 8.6  Independent visualization of imaging: - I independently  visualized the following imaging with scope of interpretation limited to determining acute life threatening conditions related to emergency care: Left great toe x-ray, which revealed soft tissue gas in the subungual region of the left first toe suggesting soft tissue infection.  No radiological evidence of osteomyelitis.  Consultation: - Consulted or discussed management/test interpretation w/ external professional: None  Consideration for admission or further workup: Considered for mission further workup however patient's vital signs, physical exam, labs, and imaging have been reassuring.  Patient given dose of IV antibiotics while in the ER.  Patient started on Keflex and Bactrim outpatient for soft tissue infection of the left great toe and follow-up with podiatry in 1 week.  Patient has no red flag symptoms or signs of systemic infection worrisome for sepsis or ascending infection.  Patient is slightly tachycardic on discharge which per previous office visits is not uncommon for her.         Final Clinical Impression(s) / ED Diagnoses Final diagnoses:  Infection of great toe    Rx / DC Orders ED Discharge Orders          Ordered    cephALEXin (KEFLEX) 500 MG capsule  2 times daily        10/11/23 1848    sulfamethoxazole-trimethoprim (BACTRIM DS) 800-160 MG tablet  2 times daily        10/11/23 1848              Dolphus Jenny, PA-C 10/11/23 1929    Charlynne Pander, MD 10/11/23 2121

## 2023-10-11 NOTE — ED Triage Notes (Signed)
Left great toe possible infection , some swelling and discoloration noted . Ambulatory with no distress .  Hx diabetes .

## 2023-10-13 ENCOUNTER — Other Ambulatory Visit: Payer: Self-pay

## 2023-10-13 ENCOUNTER — Other Ambulatory Visit: Payer: Self-pay | Admitting: Internal Medicine

## 2023-10-14 ENCOUNTER — Other Ambulatory Visit: Payer: Self-pay

## 2023-10-14 ENCOUNTER — Telehealth: Payer: Self-pay | Admitting: Internal Medicine

## 2023-10-14 NOTE — Telephone Encounter (Signed)
error 

## 2023-10-17 ENCOUNTER — Ambulatory Visit (INDEPENDENT_AMBULATORY_CARE_PROVIDER_SITE_OTHER): Payer: Managed Care, Other (non HMO) | Admitting: Podiatry

## 2023-10-17 DIAGNOSIS — L03032 Cellulitis of left toe: Secondary | ICD-10-CM | POA: Diagnosis not present

## 2023-10-17 DIAGNOSIS — L97522 Non-pressure chronic ulcer of other part of left foot with fat layer exposed: Secondary | ICD-10-CM

## 2023-10-17 DIAGNOSIS — L089 Local infection of the skin and subcutaneous tissue, unspecified: Secondary | ICD-10-CM

## 2023-10-17 MED ORDER — MUPIROCIN 2 % EX OINT: 1.0000 | TOPICAL_OINTMENT | Freq: Two times a day (BID) | CUTANEOUS | 2 refills | Status: AC

## 2023-10-17 NOTE — Patient Instructions (Addendum)
Tomorrow remove the bandage and wash with soap and water. Dry well. Apply a small amount of antibiotic ointment and bandage daily.

## 2023-10-20 ENCOUNTER — Ambulatory Visit: Payer: Managed Care, Other (non HMO) | Admitting: Medical-Surgical

## 2023-10-20 ENCOUNTER — Encounter: Payer: Self-pay | Admitting: Medical-Surgical

## 2023-10-20 VITALS — BP 123/68 | HR 119 | Resp 20 | Ht 67.5 in | Wt 219.1 lb

## 2023-10-20 DIAGNOSIS — F419 Anxiety disorder, unspecified: Secondary | ICD-10-CM | POA: Diagnosis not present

## 2023-10-20 DIAGNOSIS — N289 Disorder of kidney and ureter, unspecified: Secondary | ICD-10-CM

## 2023-10-20 DIAGNOSIS — Z794 Long term (current) use of insulin: Secondary | ICD-10-CM

## 2023-10-20 DIAGNOSIS — E1142 Type 2 diabetes mellitus with diabetic polyneuropathy: Secondary | ICD-10-CM | POA: Diagnosis not present

## 2023-10-20 DIAGNOSIS — Z23 Encounter for immunization: Secondary | ICD-10-CM | POA: Diagnosis not present

## 2023-10-20 DIAGNOSIS — E1159 Type 2 diabetes mellitus with other circulatory complications: Secondary | ICD-10-CM

## 2023-10-20 DIAGNOSIS — G4700 Insomnia, unspecified: Secondary | ICD-10-CM

## 2023-10-20 DIAGNOSIS — I152 Hypertension secondary to endocrine disorders: Secondary | ICD-10-CM

## 2023-10-20 DIAGNOSIS — F324 Major depressive disorder, single episode, in partial remission: Secondary | ICD-10-CM

## 2023-10-20 DIAGNOSIS — Z1231 Encounter for screening mammogram for malignant neoplasm of breast: Secondary | ICD-10-CM

## 2023-10-20 LAB — WOUND CULTURE
MICRO NUMBER:: 15900118
RESULT:: NO GROWTH
SPECIMEN QUALITY:: ADEQUATE

## 2023-10-20 MED ORDER — ZOLPIDEM TARTRATE ER 12.5 MG PO TBCR: 12.5000 mg | EXTENDED_RELEASE_TABLET | Freq: Every evening | ORAL | 0 refills | Status: DC | PRN

## 2023-10-20 MED ORDER — AMITRIPTYLINE HCL 75 MG PO TABS: 75.0000 mg | ORAL_TABLET | Freq: Every day | ORAL | 1 refills | Status: DC

## 2023-10-20 MED ORDER — HYDROCODONE-ACETAMINOPHEN 5-325 MG PO TABS: 1.0000 | ORAL_TABLET | Freq: Three times a day (TID) | ORAL | 0 refills | Status: DC | PRN

## 2023-10-20 MED ORDER — RIZATRIPTAN BENZOATE 10 MG PO TBDP: 10.0000 mg | ORAL_TABLET | ORAL | 3 refills | Status: AC | PRN

## 2023-10-20 MED ORDER — FLUCONAZOLE 150 MG PO TABS: 150.0000 mg | ORAL_TABLET | Freq: Once | ORAL | 0 refills | Status: AC

## 2023-10-20 NOTE — Progress Notes (Signed)
        Established patient visit  History, exam, impression, and plan:  1. Hypertension associated with diabetes (HCC) (Primary) Pleasant 58 year old female presenting today with a history of hypertension.  She is currently taking Lasix  20 mg daily, spironolactone  100 mg daily, and Toprol -XL 50 mg daily.  Tolerating all medications well without side effects.  Not regularly checking blood pressure at home.  Denies concerning symptoms.  Cardiopulmonary exam reassuring.  Blood pressure is at goal.  Continue current medications at current doses.  2. Type 2 diabetes mellitus with diabetic polyneuropathy, with long-term current use of insulin  [facility]) Managed by endocrinology.  3. Anxiety Continues to have difficulty with anxiety as well as difficulty sleeping at night.  Recently had a death in the family.  This was her brother who was quadriplegic and she is still struggling with grief.  Has been taking amitriptyline  50 mg nightly, tolerating well without side effects.  Does not find that this helps with sleeping and does not do much for her mood.  Would like to try increasing the dose.  Increasing amitriptyline  to 75 mg nightly. - amitriptyline  (ELAVIL ) 75 MG tablet; Take 1 tablet (75 mg total) by mouth at bedtime.  Dispense: 90 tablet; Refill: 1  4. Major depressive disorder with single episode, in partial remission (HCC) As noted above, increasing amitriptyline  to 75 mg nightly. - amitriptyline  (ELAVIL ) 75 MG tablet; Take 1 tablet (75 mg total) by mouth at bedtime.  Dispense: 90 tablet; Refill: 1  5. Insomnia, unspecified type Previously did well with Ambien  CR 12.5 mg nightly but this was unavailable at pharmacies.  We switched to Ambien  10 mg nightly which she has been taking regularly.  Does not feel that this works well for her and often lays in the bed for hours before able to sleep.  Would like to try switching back to the continued release.  Sending in Ambien  CR 12.5 mg nightly to check  availability.  Discussed various other options.  She has tried multiple agents in the past with poor response.  Our only other option at this point would be to try something like temazepam but we will hold off for now.  6. Abnormal kidney function Recently had abnormal kidney function with reduced GFR to 48.  She is on 2 antibiotics for paronychia.  Would like her to complete her antibiotics as prescribed then have her be met checked 1 to 2 weeks after.  Advised to avoid anti-inflammatories and make sure to stay well-hydrated during this time. - Basic Metabolic Panel (BMET)  7. Need for COVID-19 vaccine COVID-vaccine given in office today. - Pfizer Comirnaty Covid -19 Vaccine 41yrs and older  8. Need for influenza vaccination Flu vaccine given in office today. - Flu vaccine trivalent PF, 6mos and older(Flulaval,Afluria,Fluarix,Fluzone)  9. Encounter for screening mammogram for malignant neoplasm of breast Mammogram ordered. - MM Digital Screening; Future   Procedures performed this visit: None.  Return in about 6 weeks (around 12/01/2023) for mood follow up.  __________________________________ Zada FREDRIK Palin, DNP, APRN, FNP-BC Primary Care and Sports Medicine [facility] [place]

## 2023-10-27 ENCOUNTER — Encounter: Payer: Self-pay | Admitting: Podiatry

## 2023-10-27 ENCOUNTER — Ambulatory Visit (INDEPENDENT_AMBULATORY_CARE_PROVIDER_SITE_OTHER): Payer: Managed Care, Other (non HMO) | Admitting: Podiatry

## 2023-10-27 DIAGNOSIS — L97522 Non-pressure chronic ulcer of other part of left foot with fat layer exposed: Secondary | ICD-10-CM

## 2023-10-27 MED ORDER — TRAMADOL HCL 50 MG PO TABS: 50.0000 mg | ORAL_TABLET | Freq: Three times a day (TID) | ORAL | 0 refills | Status: AC | PRN

## 2023-10-27 NOTE — Progress Notes (Signed)
 Subjective: Chief Complaint  Patient presents with   Foot Ulcer    RM#13 Left foot ulcer big toe.   58 year old female presents the office the above concerns.  She states that the procedures has been healing well but she still has limited to the tip of the toe appears to be keeping mupirocin  ointment on the wound daily.  She does not see any drainage or pus.  No fevers or chills she reports. No new concerns today otherwise. She has completed the course of antibiotics.  She states that it does throb at night.  Objective: AAO x3, NAD DP/PT pulses palpable bilaterally, CRT less than 3 seconds Hallux malleus is present.  The procedure site for the nail removal has healed well.  There is ulceration still present at the distal aspect the toe measuring 1.2 x 1 cm with a granular base without any probing, undermining or tunneling.  There is no fluctuation, crepitation, malodor.  There is no other ulcerations to the right. No pain with calf compression, swelling, warmth, erythema  Assessment: Left hallux ulceration, hallux malleus  Plan: -All treatment options discussed with the patient including all alternatives, risks, complications.  -Procedure site in the nail groove was healed well.  Still conservatively distally.  I dispensed an offloading pad to help elevate the toe to avoid any excess pressure.  Will continue antibiotic.  She has stitches for now.  Postoperative hold off on further antibiotics as clinically does not appear to be infected however should symptoms change or bleeding immediately. -ABI if no improvement but she did heal the other ulcer. -Monitor for any clinical signs or symptoms of infection and directed to call the office immediately should any occur or go to the ER. -Patient encouraged to call the office with any questions, concerns, change in symptoms.   Return in about 10 days (around 11/06/2023) for toe ulcer.  X-ray next appointment  Donnice JONELLE Fees DPM

## 2023-10-28 ENCOUNTER — Ambulatory Visit: Payer: Managed Care, Other (non HMO) | Admitting: Podiatry

## 2023-11-04 ENCOUNTER — Other Ambulatory Visit: Payer: Self-pay | Admitting: Medical-Surgical

## 2023-11-04 ENCOUNTER — Other Ambulatory Visit: Payer: Self-pay

## 2023-11-07 ENCOUNTER — Ambulatory Visit (INDEPENDENT_AMBULATORY_CARE_PROVIDER_SITE_OTHER): Payer: Managed Care, Other (non HMO) | Admitting: Podiatry

## 2023-11-07 ENCOUNTER — Other Ambulatory Visit: Payer: Self-pay

## 2023-11-07 ENCOUNTER — Encounter: Payer: Self-pay | Admitting: Podiatry

## 2023-11-07 ENCOUNTER — Ambulatory Visit (INDEPENDENT_AMBULATORY_CARE_PROVIDER_SITE_OTHER): Payer: Managed Care, Other (non HMO)

## 2023-11-07 DIAGNOSIS — L97522 Non-pressure chronic ulcer of other part of left foot with fat layer exposed: Secondary | ICD-10-CM

## 2023-11-07 DIAGNOSIS — M778 Other enthesopathies, not elsewhere classified: Secondary | ICD-10-CM

## 2023-11-07 DIAGNOSIS — E1149 Type 2 diabetes mellitus with other diabetic neurological complication: Secondary | ICD-10-CM | POA: Diagnosis not present

## 2023-11-07 MED ORDER — DOXYCYCLINE HYCLATE 100 MG PO TABS: 100.0000 mg | ORAL_TABLET | Freq: Two times a day (BID) | ORAL | 0 refills | Status: DC

## 2023-11-07 NOTE — Progress Notes (Signed)
Subjective: Chief Complaint  Patient presents with   Foot Ulcer    RM#13 Left foot ulcer big toe presents with toe pain patient states she doesn't think it is healing.    58 year old female presents the office the above concerns.  She states that the procedures has been healing well but she still has limited to the tip of the toe appears to be keeping mupirocin ointment on the wound daily.  She does not see any drainage or pus.  No fevers or chills she reports. No new concerns today otherwise. She has completed the course of antibiotics.  She states that it does throb at night.  Objective: AAO x3, NAD DP/PT pulses palpable bilaterally, CRT less than 3 seconds Prophylaxis present.  Nail procedure site is well-healed.  Ulceration to distal aspect left hallux as pictured below with hyperkeratotic periwound.  There is no probing, undermining or tunneling.  There is no fluctuance or crepitation.  To the wound measures 1 x 0.8 cm after debridement.  Prior to debridement it was slightly smaller 0.8 x 0.7 cm.  There is no fluctuation or crepitation with there is no malodor. No pain with calf compression, swelling, warmth, erythema       Assessment: Left hallux ulceration, hallux malleus  Plan: -All treatment options discussed with the patient including all alternatives, risks, complications.  -X-rays obtained reviewed.  Hand.  Difficult to compare to prior x-rays.  I am concerned there is some indistinctness along the distal phalanx at the distal cortex there is no soft tissue edema. -Procedure site in the nail groove was healed well.  Ulceration still present distally.  Medically necessary wound debridement was performed today.  I sharply debrided the hyperkeratotic periwound as well as the ulceration remove nonviable, devitalized tissue in order to promote wound healing.  Debrided to healthy, granular tissue.  No significant blood loss.  She tolerated well any complications.  Betadine ointment  applied followed by dressing.  Continue with daily dressing changes with antibiotic ointment for now.  Continue offloading.  If still present next appointment likely switch to a silver alginate or collagen dressing. -Given x-ray findings as well as clinical findings.  Continue antibiotics.  Prescribe doxycycline for now. -Unfortunately risk of toe loss which we discussed. -Vascular: She has palpable pulses and she healed the nail procedure site well.  Return in about 10 days (around 11/17/2023).  X-ray next appointment  Vivi Barrack DPM

## 2023-11-15 ENCOUNTER — Ambulatory Visit (INDEPENDENT_AMBULATORY_CARE_PROVIDER_SITE_OTHER): Payer: Managed Care, Other (non HMO)

## 2023-11-15 ENCOUNTER — Encounter: Payer: Self-pay | Admitting: Podiatry

## 2023-11-15 ENCOUNTER — Ambulatory Visit (INDEPENDENT_AMBULATORY_CARE_PROVIDER_SITE_OTHER): Payer: Managed Care, Other (non HMO) | Admitting: Podiatry

## 2023-11-15 DIAGNOSIS — M778 Other enthesopathies, not elsewhere classified: Secondary | ICD-10-CM | POA: Diagnosis not present

## 2023-11-15 DIAGNOSIS — L97522 Non-pressure chronic ulcer of other part of left foot with fat layer exposed: Secondary | ICD-10-CM | POA: Diagnosis not present

## 2023-11-15 MED ORDER — DOXYCYCLINE HYCLATE 100 MG PO TABS: 100.0000 mg | ORAL_TABLET | Freq: Two times a day (BID) | ORAL | 0 refills | Status: DC

## 2023-11-15 NOTE — Progress Notes (Signed)
Subjective: Chief Complaint  Patient presents with   Foot Ulcer    RM#13 Left foot ulcer big toe causing pain minimal drainage.    58 year old female presents the office the above concerns.  States that she still discomfort to the toe.  Seems to be healing.  She does not report any significant drainage or pus.  No fevers or chills that she reports.  She is having keeping dressings on the toe daily.  Objective: AAO x3, NAD DP/PT pulses palpable bilaterally, CRT less than 3 seconds Prophylaxis present.  Nail procedure site is well-healed.  Ulceration to distal portion of the toe is smaller today.  Measures 0.8 x 0.5 cm) posterior measurements are the same.  There is hyperkeratotic periwound.  Fibrogranular wound base is present.  There is no probing to bone and there is no undermining or tunneling.  There is no fluctuation or crepitation.  No malodor. No pain with calf compression, swelling, warmth, erythema          Assessment: Left hallux ulceration, hallux malleus  Plan: -All treatment options discussed with the patient including all alternatives, risks, complications.  -X-rays obtained reviewed.  Hand.  Difficult to compare to prior x-rays.  Still concerned about some indistinctness along the distal phalanx of the distal portion.  There is no soft tissue edema. -Procedure site in the nail groove was healed well.  Ulceration still present distally.  Medically necessary wound debridement was performed today.  I sharply debrided the hyperkeratotic periwound as well as the ulceration remove nonviable, devitalized tissue in order to promote wound healing.  Debrided to healthy, granular tissue.  No significant blood loss.  She tolerated well any complications.  Betadine ointment applied followed by dressing.  Continue with daily dressing changes with antibiotic ointment for now.  Pre and post wound measurements of the same are noted above.  Continue offloading.  If still present next  appointment likely switch to a silver alginate or collagen dressing. -Discussed the left foot is much as possible.  Continue surgical shoe for offloading. -Given x-ray findings as well as clinical findings.  Continue antibiotics.  Prescribe doxycycline for now. -Unfortunately risk of toe loss which we discussed. -Vascular: She has palpable pulses and she healed the nail procedure site well.  Return in about 2 weeks (around 11/29/2023). X-ray next appointment  Vivi Barrack DPM

## 2023-11-25 ENCOUNTER — Other Ambulatory Visit: Payer: Self-pay | Admitting: Medical-Surgical

## 2023-11-25 NOTE — Telephone Encounter (Signed)
 Last filled 10/20/2023  Last office visit 10/20/2023  Upcoming appointment 12/05/2023

## 2023-12-02 ENCOUNTER — Ambulatory Visit (INDEPENDENT_AMBULATORY_CARE_PROVIDER_SITE_OTHER): Payer: Managed Care, Other (non HMO)

## 2023-12-02 ENCOUNTER — Encounter: Payer: Self-pay | Admitting: Podiatry

## 2023-12-02 ENCOUNTER — Ambulatory Visit (INDEPENDENT_AMBULATORY_CARE_PROVIDER_SITE_OTHER): Payer: Managed Care, Other (non HMO) | Admitting: Podiatry

## 2023-12-02 DIAGNOSIS — L97522 Non-pressure chronic ulcer of other part of left foot with fat layer exposed: Secondary | ICD-10-CM

## 2023-12-02 MED ORDER — DOXYCYCLINE HYCLATE 100 MG PO TABS: 100.0000 mg | ORAL_TABLET | Freq: Two times a day (BID) | ORAL | 0 refills | Status: DC

## 2023-12-05 ENCOUNTER — Other Ambulatory Visit: Payer: Self-pay

## 2023-12-05 ENCOUNTER — Encounter: Payer: Self-pay | Admitting: Medical-Surgical

## 2023-12-05 ENCOUNTER — Other Ambulatory Visit: Payer: Self-pay | Admitting: Medical-Surgical

## 2023-12-05 ENCOUNTER — Encounter: Payer: Self-pay | Admitting: Podiatry

## 2023-12-05 ENCOUNTER — Ambulatory Visit (INDEPENDENT_AMBULATORY_CARE_PROVIDER_SITE_OTHER): Payer: Managed Care, Other (non HMO) | Admitting: Medical-Surgical

## 2023-12-05 VITALS — BP 116/68 | HR 113 | Resp 20 | Ht 67.5 in | Wt 223.0 lb

## 2023-12-05 DIAGNOSIS — Z1231 Encounter for screening mammogram for malignant neoplasm of breast: Secondary | ICD-10-CM | POA: Diagnosis not present

## 2023-12-05 DIAGNOSIS — F324 Major depressive disorder, single episode, in partial remission: Secondary | ICD-10-CM | POA: Diagnosis not present

## 2023-12-05 DIAGNOSIS — F419 Anxiety disorder, unspecified: Secondary | ICD-10-CM

## 2023-12-05 MED ORDER — AMITRIPTYLINE HCL 100 MG PO TABS: 100.0000 mg | ORAL_TABLET | Freq: Every day | ORAL | 1 refills | Status: DC

## 2023-12-05 NOTE — Progress Notes (Signed)
        Established patient visit  History, exam, impression, and plan:  1. Major depressive disorder with single episode, in partial remission (HCC) (Primary) 2. Anxiety Pleasant 58 year old female presenting today for follow-up on mood.  Approximately 4 weeks ago, she was seen in the office where we increased her amitriptyline to 75 mg daily due to continued issues with depression and anxiety as well as worsening of sleep issues.  Today she reports that she increased her medication as instructed and has been tolerating it well without side effects.  Notes that she feels the medication has helped some however has some pretty significant external stressors at this time.  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  She has siblings but they do not seem to be involved in her care on a regular basis which puts the main strain on her.  They did get paperwork filled out to see if they can get an in-home aide and they have put cameras in the home so they can monitor her if they have to be away.  Additionally, she is supposed to return to work after several surgeries.  Her first day back will be Monday however this is causing her quite a bit of stress.  She reports that her managers have been picking at her performance and trying to find ways to get her in trouble and it has been difficult to maintain a professional approach.  She does admit that if the current stressors were removed from the situation, the medication would be adequate however in the setting of the above issues, would like to increase her medication.  Increase amitriptyline to 100 mg nightly and plan to follow-up in 4 weeks. - amitriptyline (ELAVIL) 100 MG tablet; Take 1 tablet (100 mg total) by mouth at bedtime.  Dispense: 90 tablet; Refill: 1  3. Breast cancer screening by mammogram Due for mammogram so ordering today. - MM Digital Screening; Future   Procedures performed this visit: None.  Return  in about 4 weeks (around 01/02/2024) for mood follow up.  __________________________________ Thayer Ohm, DNP, APRN, FNP-BC Primary Care and Sports Medicine [facility] [place]

## 2023-12-06 ENCOUNTER — Other Ambulatory Visit: Payer: Self-pay

## 2023-12-07 ENCOUNTER — Other Ambulatory Visit: Payer: Self-pay | Admitting: Medical-Surgical

## 2023-12-07 ENCOUNTER — Other Ambulatory Visit: Payer: Self-pay

## 2023-12-07 NOTE — Progress Notes (Signed)
Subjective: Chief Complaint  Patient presents with   Wound Check    F/U for left toe ulcer fatty layer exposed, patient states foot is "not acting right" and feels no better     58 year old female presents the office the above concerns.  She tells me she thinks the wound is doing better but is still present.  She is been off of her foot due to recent knee surgery.  She denies any significant drainage or pus.  No increase in swelling or redness.  Denies any fevers or chills.  No other concerns.  Objective: AAO x3, NAD DP/PT pulses palpable bilaterally, CRT less than 3 seconds Ulceration still present distal portion of the toe which is pictured below.  The dark spot on the skin pictured below was part of her sock.  Hyperkeratotic periwound along the area of ulceration.  After debrided the wound measures 0.7 x 0.4 cm and superficial debridement wound base.  There is no probing to [place] or tunneling.  There is no fluctuation or crepitation.  No malodor.  Decreased edema present. No pain with calf compression, swelling, warmth, erythema            Assessment: Left hallux ulceration, hallux malleus  Plan: -All treatment options discussed with the patient including all alternatives, risks, complications.  -X-rays obtained reviewed.  Hand.  Difficult to compare to prior x-rays.  Still concerned about some indistinctness along the distal phalanx of the distal portion.  There is no soft tissue edema. -Given x-ray changes as well as ongoing ulceration will continue antibiotics.  Doing well doxycycline we will continue this. - Medically necessary wound debridement was performed today.  I sharply debrided the hyperkeratotic periwound as well as the ulceration remove nonviable, devitalized tissue in order to promote wound healing.  Debrided to healthy, granular tissue.  No significant blood loss.  She tolerated well any complications.  Antibiotic ointment applied followed by dressing.  Continue  with daily dressing changes with antibiotic ointment for now.  Pre and post wound measurements of the same are noted above.  Continue offloading.  If still present next appointment likely switch to a silver alginate or collagen dressing. -Continue surgical shoe for offloading. -Unfortunately risk of toe loss which we have discussed. -Vascular: She has palpable pulses and she healed the nail procedure site well.  Return in about 2 weeks (around 12/16/2023) for toe ulcer, x-ray.   Vivi Barrack DPM

## 2023-12-09 ENCOUNTER — Other Ambulatory Visit: Payer: Self-pay

## 2023-12-15 ENCOUNTER — Encounter: Payer: Self-pay | Admitting: Podiatry

## 2023-12-15 ENCOUNTER — Ambulatory Visit (INDEPENDENT_AMBULATORY_CARE_PROVIDER_SITE_OTHER): Payer: Managed Care, Other (non HMO) | Admitting: Podiatry

## 2023-12-15 ENCOUNTER — Ambulatory Visit (INDEPENDENT_AMBULATORY_CARE_PROVIDER_SITE_OTHER): Payer: Managed Care, Other (non HMO)

## 2023-12-15 ENCOUNTER — Ambulatory Visit: Payer: Managed Care, Other (non HMO)

## 2023-12-15 DIAGNOSIS — L97522 Non-pressure chronic ulcer of other part of left foot with fat layer exposed: Secondary | ICD-10-CM

## 2023-12-15 DIAGNOSIS — E08621 Diabetes mellitus due to underlying condition with foot ulcer: Secondary | ICD-10-CM

## 2023-12-15 DIAGNOSIS — M778 Other enthesopathies, not elsewhere classified: Secondary | ICD-10-CM | POA: Diagnosis not present

## 2023-12-15 LAB — HM DIABETES FOOT EXAM

## 2023-12-16 NOTE — Progress Notes (Signed)
 Subjective: Chief Complaint  Patient presents with   Foot Ulcer    RM#14 Left foot ulcer patient states doing well but is having burning pain to bottom of toe.      58 year old female presents the office the above concerns.  She thinks the toe is doing well.  She denies any drainage or pus.  She still out of work given her knee surgery.  She does not report any fevers or chills.  She has been wearing a surgical shoe, offloading pad for the toe.  She does not Dors any fevers or chills.   Objective: AAO x3, NAD DP/PT pulses palpable bilaterally, CRT less than 3 seconds Ulceration still present distal portion of the hallux.  The wound is doing better and measures 0.5 x 0.2 x 0.1cm after debridement prior debridement measures smaller 0.3 x 0.1 x 0.1 cm of the hyperkeratotic tissue overlying the wound.  There is no fluctuation or crepitation present noted.  Hallux malleus present.  There is no probing, undermining or tunneling. No pain with calf compression, swelling, warmth, erythema  Assessment: Left hallux ulceration, hallux malleus  Plan: -All treatment options discussed with the patient including all alternatives, risks, complications.  -X-rays obtained reviewed.  Hand.  The prior x-rays appears to be stable.  There is concern of a lucency has not progressed.  There is no soft tissue emphysema. - Medically necessary wound debridement was performed today.  I sharply debrided the hyperkeratotic periwound as well as the ulceration remove nonviable, devitalized tissue in order to promote wound healing.  Debrided to healthy, granular tissue.  No significant blood loss.  She tolerated well any complications.  Antibiotic ointment applied followed by dressing.  Continue with daily dressing changes with antibiotic ointment for now.  Pre and post wound measurements are noted above.  Continue offloading.  If the wound is not improving well switch to a silver alginate or collagen dressing. -Continue  surgical shoe for offloading. -Unfortunately risk of toe loss which we have discussed. -Vascular: She has palpable pulses and she healed the nail procedure site well.  Return in about 2 weeks (around 12/29/2023) for toe ulcer.  Vivi Barrack DPM

## 2023-12-21 ENCOUNTER — Ambulatory Visit: Payer: Managed Care, Other (non HMO)

## 2023-12-27 ENCOUNTER — Ambulatory Visit: Payer: Managed Care, Other (non HMO) | Admitting: Podiatry

## 2023-12-28 ENCOUNTER — Other Ambulatory Visit: Payer: Self-pay | Admitting: Medical-Surgical

## 2023-12-28 ENCOUNTER — Encounter: Payer: Self-pay | Admitting: Medical-Surgical

## 2023-12-28 ENCOUNTER — Other Ambulatory Visit: Payer: Self-pay | Admitting: Podiatry

## 2023-12-29 ENCOUNTER — Ambulatory Visit: Admission: EM | Admit: 2023-12-29 | Discharge: 2023-12-29 | Disposition: A | Discharge status: Home / Self Care

## 2023-12-29 ENCOUNTER — Other Ambulatory Visit: Payer: Self-pay

## 2023-12-29 DIAGNOSIS — R040 Epistaxis: Secondary | ICD-10-CM

## 2023-12-29 DIAGNOSIS — J309 Allergic rhinitis, unspecified: Secondary | ICD-10-CM

## 2023-12-29 NOTE — ED Provider Notes (Signed)
 [facility]    CSN: [id number] Arrival date & time: 12/29/23  1432      History   Chief Complaint Chief Complaint  Patient presents with   ear irritation     Bilateral ear irritation and nose bleeds x3 days    HPI [facility] is a 58 y.o. female.   HPI  She reports she has had some scratchy throat, ear itching, dry cough, nosebleeds  She reports symptoms started about 3 days ago   She states nosebleeds occur when blows her nose or bends over  She reports that after about 5 minutes she can stop bleeding with tilting her head back and applying pressure She reports she has had about 2 nosebleeds per day for the past 3 days - states it is always from the right nostril   She denies recent sick contacts   Past Medical History:  Diagnosis Date   Anxiety    Depression    Diabetes mellitus without complication (HCC)    H/O syphilis    HSV-2 infection    Hypertension associated with diabetes (HCC) 03/30/2018   [name], left, sequela 11/06/2018   MRI results available in care everywhere/[facility]     Renal insufficiency 10/13/2018   Sleep apnea    CPAP   Stroke [facility])     Patient Active Problem List   Diagnosis Date Noted   AKI (acute kidney injury) (HCC) 04/01/2023   Dyspnea on exertion 01/26/2023   Paroxysmal tachycardia (HCC) 01/26/2023   Melanonychia 10/12/2022   Depression 11/07/2020   PND (post-nasal drip) 10/13/2020   Chronic sinusitis 09/24/2020   Insomnia 05/06/2020   Diabetes mellitus (HCC) 01/31/2020   Dyslipidemia 01/31/2020   Constipation 01/08/2020   Vaginal dryness 06/18/2019   DDD (degenerative disc disease), cervical 10/23/2018   DDD (degenerative disc disease), lumbar 10/23/2018   Chronic renal failure, stage 3a (HCC) 10/13/2018   Musculoskeletal back pain 10/13/2018   Renal insufficiency 10/13/2018   OSA (obstructive sleep apnea) 06/29/2018   Hypertension associated with diabetes (HCC) 03/30/2018    Medial epicondylitis of left elbow 02/27/2018   Pelvic floor dysfunction 11/08/2017   Flatulence, eructation and gas pain 09/22/2017   GERD (gastroesophageal reflux disease) 09/06/2017   Fatty liver 05/31/2017   Precordial pain 05/16/2017   Anemia, chronic disease 04/11/2017   Elevated alkaline phosphatase level 04/11/2017   Generalized anxiety disorder 02/22/2017   Microalbuminuria due to type 2 diabetes mellitus (HCC) 03/18/2016   Hyperlipidemia associated with type 2 diabetes mellitus (HCC) 03/10/2016   Type 2 diabetes mellitus with diabetic polyneuropathy, with long-term current use of insulin (HCC) 11/24/2015   Sinus tachycardia 11/24/2015   TIA (transient ischemic attack) 06/04/2015    Past Surgical History:  Procedure Laterality Date   fluid from knee Left    FOOT SURGERY Right    FOOT SURGERY Left    TOTAL ABDOMINAL HYSTERECTOMY      OB History     Gravida  1   Para  1   Term      Preterm      AB      Living  1      SAB      IAB      Ectopic      Multiple      Live Births               Home Medications    Prior to Admission medications   Medication Sig Start Date End Date  Taking? Authorizing Provider  acetaminophen (TYLENOL) 650 MG CR tablet Take 1 tablet by mouth as needed for pain or fever.   Yes [provider]  acyclovir ointment (ZOVIRAX) 5 % Apply 1 Application topically every 3 (three) hours.   Yes [provider]  AMBULATORY NON FORMULARY MEDICATION Continuous positive airway pressure (CPAP) machine set on AutoPAP (4-20 cmH2O), with all supplemental supplies as needed. Patient taking differently: 1 each by Other route See admin instructions. Continuous positive airway pressure (CPAP) machine set on AutoPAP (4-20 cmH2O), with all supplemental supplies as needed. 11/16/22  Yes Christen Butter, NP  amitriptyline (ELAVIL) 100 MG tablet Take 1 tablet (100 mg total) by mouth at bedtime. 12/05/23  Yes Christen Butter, NP  aspirin EC 81  MG tablet Take 1 tablet by mouth daily.   Yes [provider]  atorvastatin (LIPITOR) 40 MG tablet Take 1 tablet (40 mg total) by mouth daily. 10/03/23  Yes Shamleffer, Konrad Dolores, MD  buPROPion (WELLBUTRIN XL) 150 MG 24 hr tablet Take 1 tablet (150 mg total) by mouth every morning. Take with 300mg  dose to equal 450mg  daily 04/26/23  Yes Christen Butter, NP  buPROPion (WELLBUTRIN XL) 300 MG 24 hr tablet Take 1 tablet (300 mg total) by mouth daily. Take with 150mg  dose to equal 450mg  daily 04/26/23  Yes Christen Butter, NP  clindamycin (CLEOCIN T) 1 % SWAB Apply 1 Application topically 2 (two) times daily. Apply to the affected areas in the axilla and groin. 04/26/23  Yes Christen Butter, NP  clotrimazole-betamethasone (LOTRISONE) cream APPLY TO THE AFFECTED AREA(S) 2 TIMES A DAY Patient taking differently: Apply 1 Application topically 2 (two) times daily. 10/05/22  Yes Vivi Barrack, DPM  Continuous Glucose Sensor (DEXCOM G7 SENSOR) MISC Use as directed 03/31/23  Yes Shamleffer, Konrad Dolores, MD  COSENTYX UNOREADY 300 MG/2ML SOAJ Inject into the skin. 06/15/23  Yes [provider]  DENTA 5000 PLUS 1.1 % CREA dental cream Take 1 Application by mouth daily. 12/03/23  Yes [provider]  fexofenadine-pseudoephedrine (ALLEGRA-D 24) 180-240 MG 24 hr tablet Take 1 tablet by mouth daily as needed (allergies).   Yes [provider]  furosemide (LASIX) 20 MG tablet Take 1 tablet (20 mg total) by mouth daily. See instructions 04/26/23  Yes Christen Butter, NP  glipiZIDE (GLUCOTROL) 10 MG tablet Take 1 tablet (10 mg total) by mouth 2 (two) times daily before a meal. 10/03/23  Yes Shamleffer, Konrad Dolores, MD  HYDROcodone-acetaminophen (NORCO/VICODIN) 5-325 MG tablet Take 1 tablet by mouth every 8 (eight) hours as needed for moderate pain (pain score 4-6). 10/20/23  Yes Jessup, Joy, NP  insulin glargine, 2 Unit Dial, (TOUJEO MAX SOLOSTAR) 300 UNIT/ML Solostar Pen Inject 38 Units into the  skin daily at 6 (six) AM. 10/03/23  Yes Shamleffer, Konrad Dolores, MD  insulin lispro (HUMALOG KWIKPEN) 200 UNIT/ML KwikPen Inject 20 Units into the skin 3 (three) times daily. 05/27/23  Yes Shamleffer, Konrad Dolores, MD  levocetirizine (XYZAL) 5 MG tablet TAKE ONE TABLET BY MOUTH EVERY EVENING 06/27/23  Yes Christen Butter, NP  losartan (COZAAR) 25 MG tablet Take 1 tablet (25 mg total) by mouth daily. 10/03/23  Yes Shamleffer, Konrad Dolores, MD  mupirocin ointment (BACTROBAN) 2 % Apply 1 Application topically 2 (two) times daily. 10/17/23  Yes Vivi Barrack, DPM  Ostomy Supplies (SKIN TAC ADHESIVE BARRIER WIPE) MISC 1 applicator by Does not apply route as directed. 01/30/20  Yes Shamleffer, Konrad Dolores, MD  rizatriptan (MAXALT-MLT) 10  MG disintegrating tablet Take 1 tablet (10 mg total) by mouth as needed for migraine. May repeat in 2 hours if needed 10/20/23  Yes Christen Butter, NP  spironolactone (ALDACTONE) 100 MG tablet Take 1 tablet (100 mg total) by mouth daily. 04/26/23  Yes Christen Butter, NP  Sulfacetamide Sodium 10 % LIQD Apply topically. 06/01/23  Yes [provider]  tirzepatide Greggory Keen) 10 MG/0.5ML Pen Inject 10 mg into the skin once a week. 10/03/23  Yes Shamleffer, Konrad Dolores, MD  triamcinolone cream (KENALOG) 0.5 % Apply 1 Application topically 2 (two) times daily. To affected areas. 04/26/23  Yes Christen Butter, NP  valACYclovir (VALTREX) 500 MG tablet Take 1 tablet (500 mg total) by mouth daily. 04/26/23  Yes Jessup, Joy, NP  Vitamin D, Ergocalciferol, (DRISDOL) 1.25 MG (50000 UNIT) CAPS capsule TAKE 1 CAPSULE BY MOUTH EVERY 7 DAYS Patient taking differently: Take 50,000 Units by mouth every 7 (seven) days. 12/18/21  Yes Vivi Barrack, DPM  zolpidem (AMBIEN CR) 12.5 MG CR tablet TAKE ONE TABLET BY MOUTH AT BEDTIME AS NEEDED FOR SLEEP 11/25/23  Yes Christen Butter, NP  doxycycline (VIBRA-TABS) 100 MG tablet Take 1 tablet (100 mg total) by mouth 2 (two) times daily. 11/07/23    Vivi Barrack, DPM  doxycycline (VIBRA-TABS) 100 MG tablet Take 1 tablet (100 mg total) by mouth 2 (two) times daily. Patient not taking: Reported on 12/15/2023 12/02/23   Vivi Barrack, DPM  metoprolol succinate (TOPROL-XL) 50 MG 24 hr tablet Take 1 tablet (50 mg total) by mouth daily. Take with or immediately following a meal. 05/13/23 08/11/23  Georgeanna Lea, MD    Family History Family History  Problem Relation Age of Onset   Diabetes Mother    Heart disease Mother    Heart failure Mother    Kidney failure Father    Diabetes Sister    Kidney failure Sister    Sleep apnea Sister    Other Brother        quadraplegia   Diabetes Brother    Kidney failure Brother    Sleep apnea Brother    Diabetes Brother    Diabetes Maternal Grandmother    Colon cancer Neg Hx    Esophageal cancer Neg Hx    Pancreatic cancer Neg Hx    Stomach cancer Neg Hx    Liver disease Neg Hx     Social History Social History   Tobacco Use   Smoking status: Former    Current packs/day: 0.00    Types: Cigarettes    Quit date: 07/27/2012    Years since quitting: 11.4   Smokeless tobacco: Never  Vaping Use   Vaping status: Never Used  Substance Use Topics   Alcohol use: Yes    Comment: 2-3 drinks/month, wine liquor   Drug use: No     Allergies   Adhesive [tape], Latex, and Other   Review of Systems Review of Systems  Constitutional:  Negative for chills, fatigue and fever.  HENT:  Positive for congestion, ear pain, nosebleeds, postnasal drip and sore throat.   Respiratory:  Positive for cough.      Physical Exam Triage Vital Signs ED Triage Vitals  Encounter Vitals Group     BP 12/29/23 1523 (!) 146/81     Systolic BP Percentile --      Diastolic BP Percentile --      Pulse Rate 12/29/23 1523 (!) 120     Resp 12/29/23 1523 17  Temp 12/29/23 1523 98.6 F (37 C)     Temp Source 12/29/23 1523 Oral     SpO2 12/29/23 1523 95 %     Weight 12/29/23 1521 223 lb (101.2  kg)     Height 12/29/23 1521 5' 7.5" (1.715 m)     Head Circumference --      Peak Flow --      Pain Score 12/29/23 1521 9     Pain Loc --      Pain Education --      Exclude from Growth Chart --    No data found.  Updated Vital Signs BP (!) 146/81 (BP Location: Right Arm)   Pulse (!) 120   Temp 98.6 F (37 C) (Oral)   Resp 17   Ht 5' 7.5" (1.715 m)   Wt 223 lb (101.2 kg)   SpO2 95%   BMI 34.41 kg/m   Visual Acuity Right Eye Distance:   Left Eye Distance:   Bilateral Distance:    Right Eye Near:   Left Eye Near:    Bilateral Near:     Physical Exam Vitals reviewed.  Constitutional:      General: She is awake.     Appearance: Normal appearance. She is well-developed and well-groomed.  HENT:     Head: Normocephalic and atraumatic.     Right Ear: Hearing, tympanic membrane and ear canal normal.     Left Ear: Hearing, tympanic membrane and ear canal normal.     Nose:     Right Nostril: Epistaxis present.     Right Turbinates: Enlarged and swollen.     Left Turbinates: Enlarged and swollen.     Comments: Patient has anterior exposed capillary beds in both nasal passages.  These seem to be scabbed over currently and are not bleeding during appointment.    Mouth/Throat:     Lips: Pink.     Mouth: Mucous membranes are moist.     Pharynx: Oropharynx is clear. Uvula midline.  Eyes:     General: Lids are normal. Gaze aligned appropriately.     Extraocular Movements: Extraocular movements intact.     Conjunctiva/sclera: Conjunctivae normal.  Pulmonary:     Effort: Pulmonary effort is normal.     Breath sounds: Normal breath sounds. No decreased air movement. No decreased breath sounds, wheezing, rhonchi or rales.  Musculoskeletal:     Cervical back: Normal range of motion and neck supple.  Lymphadenopathy:     Head:     Right side of head: No submental, submandibular or preauricular adenopathy.     Left side of head: No submental, submandibular or preauricular  adenopathy.     Cervical:     Right cervical: No superficial cervical adenopathy.    Left cervical: No superficial cervical adenopathy.  Neurological:     Mental Status: She is alert.  Psychiatric:        Behavior: Behavior is cooperative.      UC Treatments / Results  Labs (all labs ordered are listed, but only abnormal results are displayed) Labs Reviewed - No data to display  EKG   Radiology No results found.  Procedures Procedures (including critical care time)  Medications Ordered in UC Medications - No data to display  Initial Impression / Assessment and Plan / UC Course  I have reviewed the triage vital signs and the nursing notes.  Pertinent labs & imaging results that were available during my care of the patient were reviewed by me and  considered in my medical decision making (see chart for details).     Patient does not currently appear to be taking a blood thinner, anticoagulant. Final Clinical Impressions(s) / UC Diagnoses   Final diagnoses:  Frequent nosebleeds  Allergic rhinitis, unspecified seasonality, unspecified trigger   Patient presents today with concerns for recurrent nosebleeds, ear itching, mild scratchy throat.  Secondary to allergic rhinitis and nasal irritation physical exam is notable for exposed capillary beds in both nostrils but these appear scabbed over and are not currently bleeding today.  At this time I recommend second-generation antihistamine once daily as well as Flonase nasal spray.  Reviewed that these can help with allergic symptoms as well as reduce nasal irritation.  Also recommend nasal saline spray to further moisturize the nasal passages and prevent irritation.  Reviewed nosebleed management techniques including moving the head forward, keeping pressure to the area, inserting tissue or sterile tampon inside the nostril to apply internal and external pressure.  ED and return precautions reviewed and provided in after visit  summary.  Follow-up as needed for progressing or persistent symptoms    Discharge Instructions      To help with your nosebleeds I recommend the following: Nasal saline spray-this will help rehydrate the nasal passages and prevent dryness. Flonase nasal spray-this will help with nasal congestion and runny nose.  This will also help with sinus congestion that may be occluding the inner ear passageways causing your itchy ears. I also recommend starting a second-generation antihistamine such as Allegra, Zyrtec, Claritin per your preference.  Generics of these worked just as well and are typically less expensive.  Please make sure that you are using the antihistamine and Flonase for several weeks for maximum resolution of symptoms.  If you do achieve notable resolution please continue to take these until allergy seasons have concluded.  If at any point you start to have nosebleeds that are not resolving after 15 minutes of continuous pressure to the area, you start feeling lightheaded or woozy, you start having nosebleeds that go down your throat and make it difficult to breathe please go to the emergency room for further evaluation and management as these are signs of a medical emergency.     ED Prescriptions   None    PDMP not reviewed this encounter.   Providence Crosby, PA-C 12/29/23 1603

## 2023-12-29 NOTE — Discharge Instructions (Addendum)
 To help with your nosebleeds I recommend the following: Nasal saline spray-this will help rehydrate the nasal passages and prevent dryness. Flonase nasal spray-this will help with nasal congestion and runny nose.  This will also help with sinus congestion that may be occluding the inner ear passageways causing your itchy ears. I also recommend starting a second-generation antihistamine such as Allegra, Zyrtec, Claritin per your preference.  Generics of these worked just as well and are typically less expensive.  Please make sure that you are using the antihistamine and Flonase for several weeks for maximum resolution of symptoms.  If you do achieve notable resolution please continue to take these until allergy seasons have concluded.  If at any point you start to have nosebleeds that are not resolving after 15 minutes of continuous pressure to the area, you start feeling lightheaded or woozy, you start having nosebleeds that go down your throat and make it difficult to breathe please go to the emergency room for further evaluation and management as these are signs of a medical emergency.

## 2023-12-29 NOTE — ED Triage Notes (Signed)
 Pt states that she has some bilateral ear itching and nose bleeds. X3 days

## 2023-12-30 ENCOUNTER — Other Ambulatory Visit: Payer: Self-pay

## 2023-12-30 MED ORDER — DOXYCYCLINE HYCLATE 100 MG PO TABS
100.0000 mg | ORAL_TABLET | Freq: Two times a day (BID) | ORAL | 0 refills | Status: DC
  Filled 2023-12-30: qty 14, 7d supply, fill #0

## 2023-12-30 NOTE — Telephone Encounter (Signed)
 I called patient. She thinks the wound is healed but thinks she may need an antibiotic. I will send over the refill. She missed her last appointment. Can we call to reschedule? Thanks!

## 2024-01-02 ENCOUNTER — Ambulatory Visit: Payer: Managed Care, Other (non HMO) | Admitting: Internal Medicine

## 2024-01-02 NOTE — Telephone Encounter (Signed)
 Requesting rx rf of  Hydrocodone acet 5-325 Last written 10/20/2023 Last OV 12/05/2023 Upcoming appt 01/03/2024

## 2024-01-02 NOTE — Progress Notes (Deleted)
 Name: Helen Perez  Age/ Sex: 58 y.o., female   MRN/ DOB: [id number], 1965-10-21     PCP: Christen Butter, NP   Reason for Endocrinology Evaluation: Type 2 Diabetes Mellitus  Initial Endocrine Consultative Visit: 01/30/2020    PATIENT IDENTIFIER: Helen Perez is a 58 y.o. female with a past medical history of HTN, TIA, OSA and T2DM. The patient has followed with Endocrinology clinic since 01/30/2020 for consultative assistance with management of her diabetes.  DIABETIC HISTORY:  Helen Perez was diagnosed with T2DM in 2000. She reports intolerance to Metformin.  She has been on an insulin pump for years.  Her hemoglobin A1c has ranged from 10.1% in 12/10/2019, peaking at 15.7% in 2016.  On her initial visit to our clinic her A1c was 12.5% , she was on Ozempic and novolog through insulin pump. We did not make any changes as she  Uses the pump only periodically as well as the Decoxm. Chronic hx of non-compliance, and was given the option to switch to MDI vs using the pump properly , she opted to continue with the pump at the time.     By 04/2020 we stopped insulin pump and started MDI regimen   Farxiga started 10/2020 and stopped by 02/2021 due to worsening genital infections  We switched Ozempic to [facility] 08/2022  Started glipizide 09/2023 as she was not consistent with   SUBJECTIVE:   During the last visit (10/03/2023): A1c 9.6% .     Today (01/02/2024): Helen Perez is here for a follow up on diabetes  She checks her blood sugars multiple  times daily, through the dexcom. The patient has not had hypoglycemic episodes since the last clinic visit.  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, S/P left foot sx 11/2021, last seen 11/2023 for a left foot ulcer Patient had a follow-up with cardiology for paroxysmal tachycardia, HTN, and dyslipidemia She had a follow-up with neurology for OSA   Denies nausea, vomiting Denies constipation or diarrhea    HOME DIABETES REGIMEN:   - Glipizide 10 mg BID - Toujeo 38 units once daily  - Humalog 20 units TIDQAC  - Mounjaro 10 mg weekly - Humalog  (BG-130/20)     Statin: Yes ACE-I/ARB: yes     CONTINUOUS GLUCOSE MONITORING RECORD INTERPRETATION    Dates of Recording: 12/3-12/16/2024   Sensor description:Dexcom  Results statistics:   CGM use % of time 86%  Average and SD 231/83  Time in range 30  % Time Above 180 28  % Time above 250 42  % Time Below target 0       Glycemic patterns summary: BGs are high throughout the day and night  Hyperglycemic episodes postprandial  Hypoglycemic episodes occurred n/a  Overnight periods mostly high    INpen Report :no updated report      DIABETIC COMPLICATIONS: Microvascular complications:  Neuropathy Denies: retinopathy,  CKD Last eye exam: Completed 2021   Macrovascular complications:  CVA Denies: CAD, PVD    HISTORY:  Past Medical History:  Past Medical History:  Diagnosis Date   Anxiety    Depression    Diabetes mellitus without complication (HCC)    H/O syphilis    HSV-2 infection    Hypertension associated with diabetes (HCC) 03/30/2018   [name], left, sequela 11/06/2018   MRI results available in care everywhere/[facility]     Renal insufficiency 10/13/2018   Sleep apnea    CPAP   Stroke (HCC)  Past Surgical History:  Past Surgical History:  Procedure Laterality Date   fluid from knee Left    FOOT SURGERY Right    FOOT SURGERY Left    TOTAL ABDOMINAL HYSTERECTOMY     Social History:  reports that she quit smoking about 11 years ago. Her smoking use included cigarettes. She has never used smokeless tobacco. She reports current alcohol use. She reports that she does not use drugs. Family History:  Family History  Problem Relation Age of Onset   Diabetes Mother    Heart disease Mother    Heart failure Mother    Kidney failure Father    Diabetes Sister    Kidney failure Sister    Sleep  apnea Sister    Other Brother        quadraplegia   Diabetes Brother    Kidney failure Brother    Sleep apnea Brother    Diabetes Brother    Diabetes Maternal Grandmother    Colon cancer Neg Hx    Esophageal cancer Neg Hx    Pancreatic cancer Neg Hx    Stomach cancer Neg Hx    Liver disease Neg Hx      HOME MEDICATIONS: Allergies as of 01/02/2024       Reactions   Adhesive [tape]    From dexcom   Latex Itching, Swelling   Other Dermatitis   FREESTYLE LIBRE SENSOR- cellulitis, wound on skin         Medication List        Accurate as of January 02, 2024 12:41 PM. If you have any questions, ask your nurse or doctor.          acetaminophen 650 MG CR tablet Commonly known as: TYLENOL Take 1 tablet by mouth as needed for pain or fever.   acyclovir ointment 5 % Commonly known as: ZOVIRAX Apply 1 Application topically every 3 (three) hours.   AMBULATORY NON FORMULARY MEDICATION Continuous positive airway pressure (CPAP) machine set on AutoPAP (4-20 cmH2O), with all supplemental supplies as needed. What changed:  how much to take how to take this when to take this   amitriptyline 100 MG tablet Commonly known as: ELAVIL Take 1 tablet (100 mg total) by mouth at bedtime.   aspirin EC 81 MG tablet Take 1 tablet by mouth daily.   atorvastatin 40 MG tablet Commonly known as: LIPITOR Take 1 tablet (40 mg total) by mouth daily.   buPROPion 300 MG 24 hr tablet Commonly known as: Wellbutrin XL Take 1 tablet (300 mg total) by mouth daily. Take with 150mg  dose to equal 450mg  daily   buPROPion 150 MG 24 hr tablet Commonly known as: Wellbutrin XL Take 1 tablet (150 mg total) by mouth every morning. Take with 300mg  dose to equal 450mg  daily   clindamycin 1 % Swab Commonly known as: CLEOCIN T Apply 1 Application topically 2 (two) times daily. Apply to the affected areas in the axilla and groin.   clotrimazole-betamethasone cream Commonly known as: LOTRISONE APPLY TO  THE AFFECTED AREA(S) 2 TIMES A DAY What changed: See the new instructions.   Cosentyx UnoReady 300 MG/2ML Soaj Generic drug: Secukinumab Inject into the skin.   Denta 5000 Plus 1.1 % Crea dental cream Generic drug: sodium fluoride Take 1 Application by mouth daily.   Dexcom G7 Sensor Misc Use as directed   doxycycline 100 MG tablet Commonly known as: VIBRA-TABS Take 1 tablet (100 mg total) by mouth 2 (two) times daily.   doxycycline  100 MG tablet Commonly known as: VIBRA-TABS Take 1 tablet (100 mg total) by mouth 2 (two) times daily.   fexofenadine-pseudoephedrine 180-240 MG 24 hr tablet Commonly known as: ALLEGRA-D 24 Take 1 tablet by mouth daily as needed (allergies).   furosemide 20 MG tablet Commonly known as: LASIX Take 1 tablet (20 mg total) by mouth daily. See instructions   glipiZIDE 10 MG tablet Commonly known as: GLUCOTROL Take 1 tablet (10 mg total) by mouth 2 (two) times daily before a meal.   HumaLOG KwikPen 200 UNIT/ML KwikPen Generic drug: insulin lispro Inject 20 Units into the skin 3 (three) times daily.   HYDROcodone-acetaminophen 5-325 MG tablet Commonly known as: NORCO/VICODIN Take 1 tablet by mouth every 8 (eight) hours as needed for moderate pain (pain score 4-6).   levocetirizine 5 MG tablet Commonly known as: XYZAL TAKE ONE TABLET BY MOUTH EVERY EVENING   losartan 25 MG tablet Commonly known as: COZAAR Take 1 tablet (25 mg total) by mouth daily.   metoprolol succinate 50 MG 24 hr tablet Commonly known as: TOPROL-XL Take 1 tablet (50 mg total) by mouth daily. Take with or immediately following a meal.   Mounjaro 10 MG/0.5ML Pen Generic drug: tirzepatide Inject 10 mg into the skin once a week.   mupirocin ointment 2 % Commonly known as: BACTROBAN Apply 1 Application topically 2 (two) times daily.   rizatriptan 10 MG disintegrating tablet Commonly known as: MAXALT-MLT Take 1 tablet (10 mg total) by mouth as needed for migraine. May  repeat in 2 hours if needed   Skin Tac Adhesive Barrier Wipe Misc 1 applicator by Does not apply route as directed.   spironolactone 100 MG tablet Commonly known as: ALDACTONE Take 1 tablet (100 mg total) by mouth daily.   Sulfacetamide Sodium 10 % Liqd Apply topically.   Toujeo Max SoloStar 300 UNIT/ML Solostar Pen Generic drug: insulin glargine (2 Unit Dial) Inject 38 Units into the skin daily at 6 (six) AM.   triamcinolone cream 0.5 % Commonly known as: KENALOG Apply 1 Application topically 2 (two) times daily. To affected areas.   valACYclovir 500 MG tablet Commonly known as: VALTREX Take 1 tablet (500 mg total) by mouth daily.   Vitamin D (Ergocalciferol) 1.25 MG (50000 UNIT) Caps capsule Commonly known as: DRISDOL TAKE 1 CAPSULE BY MOUTH EVERY 7 DAYS   zolpidem 12.5 MG CR tablet Commonly known as: AMBIEN CR TAKE ONE TABLET BY MOUTH AT BEDTIME AS NEEDED FOR SLEEP         OBJECTIVE:   Vital Signs: There were no vitals taken for this visit.  Wt Readings from Last 3 Encounters:  12/29/23 223 lb (101.2 kg)  12/05/23 223 lb (101.2 kg)  10/20/23 219 lb 1.6 oz (99.4 kg)     Exam: General: Pt appears well and is in NAD  Lungs: Clear with good BS bilat  Heart: RRR   Neuro: MS is good with appropriate affect, pt is alert and Ox3   Dm Foot Exam per podiatry 12/15/2023    DATA REVIEWED:  Lab Results  Component Value Date   HGBA1C 9.6 (A) 10/03/2023   HGBA1C 9.1 (A) 03/31/2023   HGBA1C 10.5 (A) 01/17/2023    Latest Reference Range & Units 10/11/23 17:42  Sodium 135 - 145 mmol/L 131 (L)  Potassium 3.5 - 5.1 mmol/L 4.1  Chloride 98 - 111 mmol/L 103  CO2 22 - 32 mmol/L 21 (L)  Glucose 70 - 99 mg/dL 161 (H)  BUN 6 -  20 mg/dL 23 (H)  Creatinine 1.61 - 1.00 mg/dL 0.96 (H)  Calcium 8.9 - 10.3 mg/dL 8.6 (L)  Anion gap 5 - 15  7  GFR, Estimated >60 mL/min 48 (L)  (L): Data is abnormally low (H): Data is abnormally high  Old records , labs and images have  been reviewed.    ASSESSMENT / PLAN / RECOMMENDATIONS:   1) Type 2 Diabetes Mellitus, poorly controlled , With neuropathic and macrovascular  complications - Most recent A1c of 9.6 %. Goal A1c < 7.0 %.     -A1c remains elevated -She will return for fasting glucose and C-peptide to proceed with the plan of pump -She continues to imperfect adherence to medication intake -She continues to be imperfect with taking Humalog with each meal, I have opted to start her on glipizide before breakfast and supper, she would not be on standing dose Humalog anymore as she does not take it most of the time, but she was encouraged to use correction scale before each meal if possible -In the past, I have asked her to contact tandem, but she forgot.  I have recommended ilet bionic pump, she will have to return for fasting glucose and C-peptide -Tolerating Mounjaro, will increase the dose as below -She has been noted with hypoglycemia overnight, will decrease Toujeo as below  MEDICATIONS: -Start glipizide 10 mg, 1 tablet twice daily -Increase Mounjaro 10 mg weekly -Decrease Toujeo 38 units once daily  - Correction factor : Humalog (BG-130/20) TID    EDUCATION / INSTRUCTIONS: BG monitoring instructions: Patient is instructed to check her blood sugars 4 times a day, before meals and bedtime . Call [facility] if: BG persistently < 70  I reviewed the Rule of 15 for the treatment of hypoglycemia in detail with the patient. Literature supplied.    2) Diabetic complications:  Eye: Does not have known diabetic retinopathy.  Neuro/ Feet: Does have known diabetic peripheral neuropathy .  Renal: Patient does not have known baseline CKD. She   is  on an ACEI/ARB at present.    3)Dyslipidemia:  -LDL at goal -TG slightly elevated, but this is probably due to nonfasting  Medication Continue atorvastatin 40 mg daily    4) CKD III/ Microalbuminuria   -GFR remains low -Patient recalls  being on lisinopril, unknown reason for discontinuation  Medication Start losartan 25 mg daily    F/U in 3 months    Signed electronically by: Lyndle Herrlich, MD  [facility]  [facility] [address] [place] [place], [place] [address] Phone: [phone] FAX: [phone]   CC: Christen Butter, NP [address] [place] [address] Phone: [phone]  Fax: [phone]  Return to Endocrinology clinic as below: Future Appointments  Date Time Provider Department Center  01/02/2024  2:00 PM Avraham Benish, Konrad Dolores, MD [facility] None  01/03/2024  2:00 PM Christen Butter, NP [facility] None  01/11/2024 12:40 PM [facility] 1 [facility]  02/23/2024  2:00 PM Butch Penny, NP [facility] None

## 2024-01-03 ENCOUNTER — Ambulatory Visit (INDEPENDENT_AMBULATORY_CARE_PROVIDER_SITE_OTHER): Payer: Managed Care, Other (non HMO) | Admitting: Medical-Surgical

## 2024-01-03 ENCOUNTER — Encounter: Payer: Self-pay | Admitting: Medical-Surgical

## 2024-01-03 VITALS — BP 123/76 | HR 120 | Resp 18 | Ht 67.5 in | Wt 225.1 lb

## 2024-01-03 DIAGNOSIS — F324 Major depressive disorder, single episode, in partial remission: Secondary | ICD-10-CM | POA: Diagnosis not present

## 2024-01-03 DIAGNOSIS — F419 Anxiety disorder, unspecified: Secondary | ICD-10-CM

## 2024-01-03 NOTE — Progress Notes (Unsigned)
 Subjective:  Patient ID: Helen Perez, female    DOB: 17-Aug-1966, 58 y.o.   MRN: [id number]  Patient Care Team: Christen Butter, NP as PCP - General (Nurse Practitioner)   Chief Complaint:  Hypertension   HPI:  Helen Perez is a 58 y.o. female presenting on 01/03/2024 for Hypertension   History, Exam,  Impression and Plan   1. Major depressive disorder with single episode, in partial remission (HCC) (Primary) 2. Anxiety Patient presents 4 weeks after increasing amitriptyline to 100 mg.  Takes medication as directed without side effects or complications.   Patient states that amitriptyline is working without side effects or complications and is not interested in changing medication regimen further. Patient denies SI/HI.  Family stressors have not decreased and is actively seeking assistance with management of her mothers dementia/alzheimer's disease.  Patient is primary caregiver and this is her primary stressor.  Reports she is actively seeking assistance without success from her mothers insurance for home health or respite care but because of no other health conditions her mother is unable to receive home health.  Reports receiving minimal help or assistance from family members.  She is  actively seeking respite care and resources to keep at home vs memory care facility.  Blood pressure elevated today at 143/75 with heart rate of 122.  Blood pressure rechecked at 123/76 with pulse rate is 120.  Patient is alert interactive in good humor with positive affect without lethargy anxiety or depressed affect.  No changes to medication regimen today:       -    Reach out to clinical social work for possible resources.   Continue all other maintenance medications.  Follow up plan: Return in about 3 months (around 04/04/2024) for chronic disease follow up.    Relevant past medical, surgical, family, and social history reviewed and updated as indicated.  Allergies and medications reviewed and  updated. Data reviewed: Chart in Epic.   Past Medical History:  Diagnosis Date  . Anxiety   . Depression   . Diabetes mellitus without complication (HCC)   . H/O syphilis   . HSV-2 infection   . Hypertension associated with diabetes (HCC) 03/30/2018  . [name], left, sequela 11/06/2018   MRI results available in care everywhere/[facility]    . Renal insufficiency 10/13/2018  . Sleep apnea    CPAP  . Stroke [facility])     Past Surgical History:  Procedure Laterality Date  . fluid from knee Left   . FOOT SURGERY Right   . FOOT SURGERY Left   . TOTAL ABDOMINAL HYSTERECTOMY      Social History   Socioeconomic History  . Marital status: Single    Spouse name: Not on file  . Number of children: 1  . Years of education: Not on file  . Highest education level: Some college, no degree  Occupational History  . Not on file  Tobacco Use  . Smoking status: Former    Current packs/day: 0.00    Types: Cigarettes    Quit date: 07/27/2012    Years since quitting: 11.4  . Smokeless tobacco: Never  Vaping Use  . Vaping status: Never Used  Substance and Sexual Activity  . Alcohol use: Yes    Comment: 2-3 drinks/month, wine liquor  . Drug use: No  . Sexual activity: Yes    Partners: Male    Birth control/protection: Surgical  Other Topics Concern  . Not on file  Social History Narrative  .  Not on file   Social Drivers of Health   Financial Resource Strain: Low Risk  (10/16/2023)   Overall Financial Resource Strain (CARDIA)   . Difficulty of Paying Living Expenses: Not hard at all  Food Insecurity: No Food Insecurity (10/16/2023)   Hunger Vital Sign   . Worried About Programme researcher, broadcasting/film/video in the Last Year: Never true   . Ran Out of Food in the Last Year: Never true  Transportation Needs: No Transportation Needs (10/16/2023)   PRAPARE - Transportation   . Lack of Transportation (Medical): No   . Lack of Transportation (Non-Medical): No  Physical  Activity: Unknown (10/16/2023)   Exercise Vital Sign   . Days of Exercise per Week: 0 days   . Minutes of Exercise per Session: Not on file  Stress: No Stress Concern Present (10/16/2023)   [employer] of Occupational Health - Occupational Stress Questionnaire   . Feeling of Stress : Only a little  Social Connections: Moderately Integrated (10/16/2023)   Social Connection and Isolation Panel [NHANES]   . Frequency of Communication with Friends and Family: More than three times a week   . Frequency of Social Gatherings with Friends and Family: Never   . Attends Religious Services: More than 4 times per year   . Active Member of Clubs or Organizations: No   . Attends Banker Meetings: More than 4 times per year   . Marital Status: Never married  Intimate Partner Violence: Unknown (01/22/2022)   Received from [facility]   HITS   . Physically Hurt: Not on file   . Insult or Talk Down To: Not on file   . Threaten Physical Harm: Not on file   . Scream or Curse: Not on file    Outpatient Encounter Medications as of 01/03/2024  Medication Sig  . acetaminophen (TYLENOL) 650 MG CR tablet Take 1 tablet by mouth as needed for pain or fever.  Marland Kitchen acyclovir ointment (ZOVIRAX) 5 % Apply 1 Application topically every 3 (three) hours.  . AMBULATORY NON FORMULARY MEDICATION Continuous positive airway pressure (CPAP) machine set on AutoPAP (4-20 cmH2O), with all supplemental supplies as needed. (Patient taking differently: 1 each by Other route See admin instructions. Continuous positive airway pressure (CPAP) machine set on AutoPAP (4-20 cmH2O), with all supplemental supplies as needed.)  . amitriptyline (ELAVIL) 100 MG tablet Take 1 tablet (100 mg total) by mouth at bedtime.  Marland Kitchen aspirin EC 81 MG tablet Take 1 tablet by mouth daily.  Marland Kitchen atorvastatin (LIPITOR) 40 MG tablet Take 1 tablet (40 mg total) by mouth daily.  Marland Kitchen buPROPion (WELLBUTRIN XL) 150 MG 24 hr tablet Take 1  tablet (150 mg total) by mouth every morning. Take with 300mg  dose to equal 450mg  daily  . buPROPion (WELLBUTRIN XL) 300 MG 24 hr tablet Take 1 tablet (300 mg total) by mouth daily. Take with 150mg  dose to equal 450mg  daily  . clindamycin (CLEOCIN T) 1 % SWAB Apply 1 Application topically 2 (two) times daily. Apply to the affected areas in the axilla and groin.  . clotrimazole-betamethasone (LOTRISONE) cream APPLY TO THE AFFECTED AREA(S) 2 TIMES A DAY (Patient taking differently: Apply 1 Application topically 2 (two) times daily.)  . Continuous Glucose Sensor (DEXCOM G7 SENSOR) MISC Use as directed  . COSENTYX UNOREADY 300 MG/2ML SOAJ Inject into the skin.  . DENTA 5000 PLUS 1.1 % CREA dental cream Take 1 Application by mouth daily.  Marland Kitchen doxycycline (VIBRA-TABS) 100 MG tablet  Take 1 tablet (100 mg total) by mouth 2 (two) times daily.  Marland Kitchen doxycycline (VIBRA-TABS) 100 MG tablet Take 1 tablet (100 mg total) by mouth 2 (two) times daily.  . fexofenadine-pseudoephedrine (ALLEGRA-D 24) 180-240 MG 24 hr tablet Take 1 tablet by mouth daily as needed (allergies).  . furosemide (LASIX) 20 MG tablet Take 1 tablet (20 mg total) by mouth daily. See instructions  . glipiZIDE (GLUCOTROL) 10 MG tablet Take 1 tablet (10 mg total) by mouth 2 (two) times daily before a meal.  . HYDROcodone-acetaminophen (NORCO/VICODIN) 5-325 MG tablet Take 1 tablet by mouth every 8 (eight) hours as needed for moderate pain (pain score 4-6).  Marland Kitchen insulin glargine, 2 Unit Dial, (TOUJEO MAX SOLOSTAR) 300 UNIT/ML Solostar Pen Inject 38 Units into the skin daily at 6 (six) AM.  . insulin lispro (HUMALOG KWIKPEN) 200 UNIT/ML KwikPen Inject 20 Units into the skin 3 (three) times daily.  Marland Kitchen levocetirizine (XYZAL) 5 MG tablet TAKE ONE TABLET BY MOUTH EVERY EVENING  . losartan (COZAAR) 25 MG tablet Take 1 tablet (25 mg total) by mouth daily.  . mupirocin ointment (BACTROBAN) 2 % Apply 1 Application topically 2 (two) times daily.  . Ostomy Supplies  (SKIN TAC ADHESIVE BARRIER WIPE) MISC 1 applicator by Does not apply route as directed.  . rizatriptan (MAXALT-MLT) 10 MG disintegrating tablet Take 1 tablet (10 mg total) by mouth as needed for migraine. May repeat in 2 hours if needed  . spironolactone (ALDACTONE) 100 MG tablet Take 1 tablet (100 mg total) by mouth daily.  . Sulfacetamide Sodium 10 % LIQD Apply topically.  . tirzepatide (MOUNJARO) 10 MG/0.5ML Pen Inject 10 mg into the skin once a week.  . triamcinolone cream (KENALOG) 0.5 % Apply 1 Application topically 2 (two) times daily. To affected areas.  . valACYclovir (VALTREX) 500 MG tablet Take 1 tablet (500 mg total) by mouth daily.  . Vitamin D, Ergocalciferol, (DRISDOL) 1.25 MG (50000 UNIT) CAPS capsule TAKE 1 CAPSULE BY MOUTH EVERY 7 DAYS (Patient taking differently: Take 50,000 Units by mouth every 7 (seven) days.)  . zolpidem (AMBIEN CR) 12.5 MG CR tablet TAKE ONE TABLET BY MOUTH AT BEDTIME AS NEEDED FOR SLEEP  . metoprolol succinate (TOPROL-XL) 50 MG 24 hr tablet Take 1 tablet (50 mg total) by mouth daily. Take with or immediately following a meal.   No facility-administered encounter medications on file as of 01/03/2024.    Allergies  Allergen Reactions  . Adhesive [Tape]     From dexcom  . Latex Itching and Swelling  . Other Dermatitis    FREESTYLE LIBRE SENSOR- cellulitis, wound on skin     Review of Systems      Objective:  BP (!) 143/75 (BP Location: Left Arm, Cuff Size: Normal)   Pulse (!) 122   Resp 20   Ht 5' 7.5" (1.715 m)   Wt 225 lb 1.6 oz (102.1 kg)   SpO2 96%   BMI 34.74 kg/m    Wt Readings from Last 3 Encounters:  01/03/24 225 lb 1.6 oz (102.1 kg)  12/29/23 223 lb (101.2 kg)  12/05/23 223 lb (101.2 kg)    Physical Exam  Results for orders placed or performed in visit on 10/17/23  WOUND CULTURE   Collection Time: 10/17/23 10:23 AM   Specimen: Wound  Result Value Ref Range   MICRO NUMBER: [id number]    SPECIMEN QUALITY: Adequate     SOURCE: LEFT HALLUX    STATUS: FINAL    GRAM  STAIN:      No white blood cells seen Few epithelial cells Few Gram positive bacilli Rare Gram positive cocci in pairs   RESULT:      Growth of skin flora (note: Growth does not include S. aureus, beta-hemolytic Streptococci or P. aeruginosa).       Pertinent labs & imaging results that were available during my care of the patient were reviewed by me and considered in my medical decision making.   Continue healthy lifestyle choices, including diet (rich in fruits, vegetables, and lean proteins, and low in salt and simple carbohydrates) and exercise (at least 30 minutes of moderate physical activity daily).   The above assessment and management plan was discussed with the patient. The patient verbalized understanding of and has agreed to the management plan. Patient is aware to call the clinic if they develop any new symptoms or if symptoms persist or worsen. Patient is aware when to return to the clinic for a follow-up visit. Patient educated on when it is appropriate to go to the emergency department.   Maryelizabeth Kaufmann Student AGNP

## 2024-01-04 ENCOUNTER — Encounter: Payer: Self-pay | Admitting: Medical-Surgical

## 2024-01-04 NOTE — Progress Notes (Signed)
 Medical screening examination/treatment was performed by qualified clinical staff member and as supervising provider I was immediately available for consultation/collaboration. I have reviewed documentation and agree with assessment and plan.  Thayer Ohm, DNP, APRN, FNP-BC [facility]

## 2024-01-09 ENCOUNTER — Other Ambulatory Visit: Payer: Self-pay | Admitting: Medical-Surgical

## 2024-01-09 ENCOUNTER — Encounter: Payer: Self-pay | Admitting: Internal Medicine

## 2024-01-09 ENCOUNTER — Other Ambulatory Visit: Payer: Self-pay

## 2024-01-09 ENCOUNTER — Ambulatory Visit (INDEPENDENT_AMBULATORY_CARE_PROVIDER_SITE_OTHER): Admitting: Internal Medicine

## 2024-01-09 VITALS — BP 124/80 | HR 80 | Ht 67.5 in | Wt 224.0 lb

## 2024-01-09 DIAGNOSIS — E1142 Type 2 diabetes mellitus with diabetic polyneuropathy: Secondary | ICD-10-CM | POA: Diagnosis not present

## 2024-01-09 DIAGNOSIS — R809 Proteinuria, unspecified: Secondary | ICD-10-CM | POA: Diagnosis not present

## 2024-01-09 DIAGNOSIS — G4733 Obstructive sleep apnea (adult) (pediatric): Secondary | ICD-10-CM

## 2024-01-09 DIAGNOSIS — Z794 Long term (current) use of insulin: Secondary | ICD-10-CM

## 2024-01-09 DIAGNOSIS — E1159 Type 2 diabetes mellitus with other circulatory complications: Secondary | ICD-10-CM

## 2024-01-09 DIAGNOSIS — E1165 Type 2 diabetes mellitus with hyperglycemia: Secondary | ICD-10-CM

## 2024-01-09 LAB — POCT GLYCOSYLATED HEMOGLOBIN (HGB A1C): Hemoglobin A1C: 8.6 % — AB (ref 4.0–5.6)

## 2024-01-09 MED ORDER — TIRZEPATIDE 12.5 MG/0.5ML ~~LOC~~ SOAJ
12.5000 mg | SUBCUTANEOUS | 3 refills | Status: DC
  Filled 2024-01-09: qty 2, 28d supply, fill #0
  Filled 2024-01-11 – 2024-02-16 (×3): qty 2, 28d supply, fill #1
  Filled 2024-03-11: qty 2, 28d supply, fill #2
  Filled 2024-03-30 – 2024-04-29 (×4): qty 2, 28d supply, fill #3

## 2024-01-09 MED ORDER — LOSARTAN POTASSIUM 25 MG PO TABS
25.0000 mg | ORAL_TABLET | Freq: Every day | ORAL | 3 refills | Status: DC
  Filled 2024-01-09 – 2024-03-30 (×5): qty 90, 90d supply, fill #0
  Filled 2024-04-25 – 2024-06-07 (×5): qty 90, 90d supply, fill #1

## 2024-01-09 MED ORDER — AMBULATORY NON FORMULARY MEDICATION: 0 refills | Status: AC

## 2024-01-09 NOTE — Progress Notes (Signed)
 Printed and you signed I will fax to [facility]

## 2024-01-09 NOTE — Patient Instructions (Signed)
-   Glipizide 10 mg, 1 tablet before Breakfast and Supper  -Increase Mounjaro 12.5 mg weekly  -Take Toujeo 38 units daily  -Humalog  correctional insulin:Use the scale below to help guide you BEFORE each meal   Blood sugar before meal Number of units to inject  Less than 150 0 unit  151 -  170 1 units  171 -  185 2 units  186 -  205 3 units  206 -  225 4 units  226 -  245 5 units  246 -  266 6 units  267 -  286 7 units  287 -  306 8 units  306 - 326 9 units       HOW TO TREAT LOW BLOOD SUGARS (Blood sugar LESS THAN 70 MG/DL) Please follow the RULE OF 15 for the treatment of hypoglycemia treatment (when your (blood sugars are less than 70 mg/dL)   STEP 1: Take 15 grams of carbohydrates when your blood sugar is low, which includes:  3-4 GLUCOSE TABS  OR 3-4 OZ OF JUICE OR REGULAR SODA OR ONE TUBE OF GLUCOSE GEL    STEP 2: RECHECK blood sugar in 15 MINUTES STEP 3: If your blood sugar is still low at the 15 minute recheck --> then, go back to STEP 1 and treat AGAIN with another 15 grams of carbohydrates.

## 2024-01-09 NOTE — Progress Notes (Signed)
 Name: Helen Perez  Age/ Sex: 58 y.o., female   MRN/ DOB: [id number], 10/31/1965     PCP: Christen Butter, NP   Reason for Endocrinology Evaluation: Type 2 Diabetes Mellitus  Initial Endocrine Consultative Visit: 01/30/2020    PATIENT IDENTIFIER: Helen Perez is a 58 y.o. female with a past medical history of HTN, TIA, OSA and T2DM. The patient has followed with Endocrinology clinic since 01/30/2020 for consultative assistance with management of her diabetes.  DIABETIC HISTORY:  Helen Perez was diagnosed with T2DM in 2000. She reports intolerance to Metformin.  She has been on an insulin pump for years.  Her hemoglobin A1c has ranged from 10.1% in 12/10/2019, peaking at 15.7% in 2016.  On her initial visit to our clinic her A1c was 12.5% , she was on Ozempic and novolog through insulin pump. We did not make any changes as she  Uses the pump only periodically as well as the Decoxm. Chronic hx of non-compliance, and was given the option to switch to MDI vs using the pump properly , she opted to continue with the pump at the time.     By 04/2020 we stopped insulin pump and started MDI regimen   Farxiga started 10/2020 and stopped by 02/2021 due to worsening genital infections  We switched Ozempic to [facility] 08/2022  Started glipizide 09/2023 as she was not consistent with   SUBJECTIVE:   During the last visit (10/03/2023): A1c 9.6% .     Today (01/09/2024): Helen Perez is here for a follow up on diabetes  She checks her blood sugars multiple  times daily, through the dexcom. The patient has not had hypoglycemic episodes since the last clinic visit.  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, S/P left foot sx 11/2021, last seen 11/2023 for a left foot ulcer  She had a left knee sx   Patient had a follow-up with cardiology for paroxysmal tachycardia, HTN, and dyslipidemia She had a follow-up with neurology for OSA   Denies nausea, vomiting Denies constipation or diarrhea     HOME DIABETES REGIMEN:  - Glipizide 10 mg BID - Toujeo 38 units once daily  - Mounjaro 10 mg weekly - Humalog  (BG-130/20) TIDQAC - Losartan 25 mg daily     Statin: Yes ACE-I/ARB: yes     CONTINUOUS GLUCOSE MONITORING RECORD INTERPRETATION    Dates of Recording: 3/11-3/24/2025   Sensor description:Dexcom  Results statistics:   CGM use % of time 86%  Average and SD 213/84  Time in range 38  % Time Above 180 31  % Time above 250 30  % Time Below target 1       Glycemic patterns summary: BGs fluctuate throughout the day and night  Hyperglycemic episodes postprandial  Hypoglycemic episodes occurred after a bolus  Overnight periods variable    INpen Report :no updated report      DIABETIC COMPLICATIONS: Microvascular complications:  Neuropathy Denies: retinopathy,  CKD Last eye exam: Completed 2021   Macrovascular complications:  CVA Denies: CAD, PVD    HISTORY:  Past Medical History:  Past Medical History:  Diagnosis Date   Anxiety    Depression    Diabetes mellitus without complication (HCC)    H/O syphilis    HSV-2 infection    Hypertension associated with diabetes (HCC) 03/30/2018   [name], left, sequela 11/06/2018   MRI results available in care everywhere/[facility]     Renal insufficiency 10/13/2018   Sleep apnea  CPAP   Stroke [facility])    Past Surgical History:  Past Surgical History:  Procedure Laterality Date   fluid from knee Left    FOOT SURGERY Right    FOOT SURGERY Left    TOTAL ABDOMINAL HYSTERECTOMY     Social History:  reports that she quit smoking about 11 years ago. Her smoking use included cigarettes. She has never used smokeless tobacco. She reports current alcohol use. She reports that she does not use drugs. Family History:  Family History  Problem Relation Age of Onset   Diabetes Mother    Heart disease Mother    Heart failure Mother    Kidney failure Father    Diabetes Sister     Kidney failure Sister    Sleep apnea Sister    Other Brother        quadraplegia   Diabetes Brother    Kidney failure Brother    Sleep apnea Brother    Diabetes Brother    Diabetes Maternal Grandmother    Colon cancer Neg Hx    Esophageal cancer Neg Hx    Pancreatic cancer Neg Hx    Stomach cancer Neg Hx    Liver disease Neg Hx      HOME MEDICATIONS: Allergies as of 01/09/2024       Reactions   Adhesive [tape]    From dexcom   Latex Itching, Swelling   Other Dermatitis   FREESTYLE LIBRE SENSOR- cellulitis, wound on skin         Medication List        Accurate as of January 09, 2024 10:35 AM. If you have any questions, ask your nurse or doctor.          acetaminophen 650 MG CR tablet Commonly known as: TYLENOL Take 1 tablet by mouth as needed for pain or fever.   acyclovir ointment 5 % Commonly known as: ZOVIRAX Apply 1 Application topically every 3 (three) hours.   AMBULATORY NON FORMULARY MEDICATION Continuous positive airway pressure (CPAP) machine set on AutoPAP (4-20 cmH2O), with all supplemental supplies as needed. What changed:  how much to take how to take this when to take this   amitriptyline 100 MG tablet Commonly known as: ELAVIL Take 1 tablet (100 mg total) by mouth at bedtime.   aspirin EC 81 MG tablet Take 1 tablet by mouth daily.   atorvastatin 40 MG tablet Commonly known as: LIPITOR Take 1 tablet (40 mg total) by mouth daily.   buPROPion 300 MG 24 hr tablet Commonly known as: Wellbutrin XL Take 1 tablet (300 mg total) by mouth daily. Take with 150mg  dose to equal 450mg  daily   buPROPion 150 MG 24 hr tablet Commonly known as: Wellbutrin XL Take 1 tablet (150 mg total) by mouth every morning. Take with 300mg  dose to equal 450mg  daily   clindamycin 1 % Swab Commonly known as: CLEOCIN T Apply 1 Application topically 2 (two) times daily. Apply to the affected areas in the axilla and groin.   clotrimazole-betamethasone  cream Commonly known as: LOTRISONE APPLY TO THE AFFECTED AREA(S) 2 TIMES A DAY What changed: See the new instructions.   Cosentyx UnoReady 300 MG/2ML Soaj Generic drug: Secukinumab Inject into the skin.   Denta 5000 Plus 1.1 % Crea dental cream Generic drug: sodium fluoride Take 1 Application by mouth daily.   Dexcom G7 Sensor Misc Use as directed   doxycycline 100 MG tablet Commonly known as: VIBRA-TABS Take 1 tablet (100 mg total) by  mouth 2 (two) times daily.   doxycycline 100 MG tablet Commonly known as: VIBRA-TABS Take 1 tablet (100 mg total) by mouth 2 (two) times daily.   fexofenadine-pseudoephedrine 180-240 MG 24 hr tablet Commonly known as: ALLEGRA-D 24 Take 1 tablet by mouth daily as needed (allergies).   furosemide 20 MG tablet Commonly known as: LASIX Take 1 tablet (20 mg total) by mouth daily. See instructions   glipiZIDE 10 MG tablet Commonly known as: GLUCOTROL Take 1 tablet (10 mg total) by mouth 2 (two) times daily before a meal.   HumaLOG KwikPen 200 UNIT/ML KwikPen Generic drug: insulin lispro Inject 20 Units into the skin 3 (three) times daily.   HYDROcodone-acetaminophen 5-325 MG tablet Commonly known as: NORCO/VICODIN Take 1 tablet by mouth every 8 (eight) hours as needed for moderate pain (pain score 4-6).   levocetirizine 5 MG tablet Commonly known as: XYZAL TAKE ONE TABLET BY MOUTH EVERY EVENING   losartan 25 MG tablet Commonly known as: COZAAR Take 1 tablet (25 mg total) by mouth daily.   metoprolol succinate 50 MG 24 hr tablet Commonly known as: TOPROL-XL Take 1 tablet (50 mg total) by mouth daily. Take with or immediately following a meal.   Mounjaro 10 MG/0.5ML Pen Generic drug: tirzepatide Inject 10 mg into the skin once a week.   mupirocin ointment 2 % Commonly known as: BACTROBAN Apply 1 Application topically 2 (two) times daily.   rizatriptan 10 MG disintegrating tablet Commonly known as: MAXALT-MLT Take 1 tablet (10  mg total) by mouth as needed for migraine. May repeat in 2 hours if needed   Skin Tac Adhesive Barrier Wipe Misc 1 applicator by Does not apply route as directed.   spironolactone 100 MG tablet Commonly known as: ALDACTONE Take 1 tablet (100 mg total) by mouth daily.   Sulfacetamide Sodium 10 % Liqd Apply topically.   Toujeo Max SoloStar 300 UNIT/ML Solostar Pen Generic drug: insulin glargine (2 Unit Dial) Inject 38 Units into the skin daily at 6 (six) AM.   triamcinolone cream 0.5 % Commonly known as: KENALOG Apply 1 Application topically 2 (two) times daily. To affected areas.   valACYclovir 500 MG tablet Commonly known as: VALTREX Take 1 tablet (500 mg total) by mouth daily.   Vitamin D (Ergocalciferol) 1.25 MG (50000 UNIT) Caps capsule Commonly known as: DRISDOL TAKE 1 CAPSULE BY MOUTH EVERY 7 DAYS   zolpidem 12.5 MG CR tablet Commonly known as: AMBIEN CR TAKE ONE TABLET BY MOUTH AT BEDTIME AS NEEDED FOR SLEEP         OBJECTIVE:   Vital Signs: BP 124/80 (BP Location: Left Arm, Patient Position: Sitting, Cuff Size: Normal)   Pulse 80   Ht 5' 7.5" (1.715 m)   Wt 224 lb (101.6 kg)   SpO2 98%   BMI 34.57 kg/m   Wt Readings from Last 3 Encounters:  01/09/24 224 lb (101.6 kg)  01/03/24 225 lb 1.6 oz (102.1 kg)  12/29/23 223 lb (101.2 kg)     Exam: General: Pt appears well and is in NAD  Lungs: Clear with good BS bilat  Heart: RRR   LE: Left ankle swelling noted   Neuro: MS is good with appropriate affect, pt is alert and Ox3   Dm Foot Exam per podiatry 12/15/2023    DATA REVIEWED:  Lab Results  Component Value Date   HGBA1C 9.6 (A) 10/03/2023   HGBA1C 9.1 (A) 03/31/2023   HGBA1C 10.5 (A) 01/17/2023    Latest Reference  Range & Units 10/11/23 17:42  Sodium 135 - 145 mmol/L 131 (L)  Potassium 3.5 - 5.1 mmol/L 4.1  Chloride 98 - 111 mmol/L 103  CO2 22 - 32 mmol/L 21 (L)  Glucose 70 - 99 mg/dL 161 (H)  BUN 6 - 20 mg/dL 23 (H)  Creatinine 0.96 -  1.00 mg/dL 0.45 (H)  Calcium 8.9 - 10.3 mg/dL 8.6 (L)  Anion gap 5 - 15  7  GFR, Estimated >60 mL/min 48 (L)  (L): Data is abnormally low (H): Data is abnormally high  Old records , labs and images have been reviewed.    ASSESSMENT / PLAN / RECOMMENDATIONS:   1) Type 2 Diabetes Mellitus, poorly controlled , With neuropathic and macrovascular  complications - Most recent A1c of 8.6 %. Goal A1c < 7.0 %.     -A1c remains elevated but it has trended down -She continues to imperfect adherence to medication intake -I will increase Mounjaro as below -Patient advised to take glipizide 15-20 minutes before first and last meal of the day, and fortunately she has been taking it and not eating at times which has resulted in hypoglycemia -She uses Humalog on average 2 times a week   MEDICATIONS: -Continue Glipizide 10 mg, 1 tablet twice daily -Increase Mounjaro 12.5 mg weekly -Continue Toujeo 38 units once daily  - Correction factor : Humalog (BG-130/20) TID    EDUCATION / INSTRUCTIONS: BG monitoring instructions: Patient is instructed to check her blood sugars 4 times a day, before meals and bedtime . Call [facility] if: BG persistently < 70  I reviewed the Rule of 15 for the treatment of hypoglycemia in detail with the patient. Literature supplied.    2) Diabetic complications:  Eye: Does not have known diabetic retinopathy.  Neuro/ Feet: Does have known diabetic peripheral neuropathy .  Renal: Patient does not have known baseline CKD. She   is  on an ACEI/ARB at present.    3)Dyslipidemia:  -LDL at goal -TG slightly elevated,in the past   Medication Continue atorvastatin 40 mg daily    4) CKD III/ Microalbuminuria   -GFR remains low -Patient recalls being on lisinopril, unknown reason for discontinuation -Encourage compliance with losartan  Medication Continue losartan 25 mg daily    F/U in 4 months    Signed electronically by: Lyndle Herrlich, MD  [facility] Endocrinology  [facility] [address] [place], [place] [address] Phone: [phone] FAX: [phone]   CC: Christen Butter, NP [address] [place] [address] Phone: [phone]  Fax: [phone]  Return to Endocrinology clinic as below: Future Appointments  Date Time Provider Department Center  01/11/2024 12:40 PM [facility]  02/23/2024  2:00 PM Butch Penny, NP [facility] None  04/12/2024  2:40 PM Christen Butter, NP [facility] None

## 2024-01-11 ENCOUNTER — Ambulatory Visit: Payer: Managed Care, Other (non HMO)

## 2024-01-11 ENCOUNTER — Other Ambulatory Visit: Payer: Self-pay | Admitting: Medical-Surgical

## 2024-01-12 ENCOUNTER — Other Ambulatory Visit: Payer: Self-pay

## 2024-01-17 ENCOUNTER — Other Ambulatory Visit: Payer: Self-pay

## 2024-02-01 ENCOUNTER — Other Ambulatory Visit: Payer: Self-pay | Admitting: Podiatry

## 2024-02-01 ENCOUNTER — Encounter: Payer: Self-pay | Admitting: Podiatry

## 2024-02-01 ENCOUNTER — Other Ambulatory Visit: Payer: Self-pay

## 2024-02-01 ENCOUNTER — Other Ambulatory Visit: Payer: Self-pay | Admitting: Medical-Surgical

## 2024-02-01 MED ORDER — DOXYCYCLINE HYCLATE 100 MG PO TABS: 100.0000 mg | ORAL_TABLET | Freq: Two times a day (BID) | ORAL | 0 refills | Status: DC

## 2024-02-01 NOTE — Telephone Encounter (Signed)
 Please schedule her an appointment to come in. Can we get her in tomorrow? Thanks!

## 2024-02-02 ENCOUNTER — Ambulatory Visit (INDEPENDENT_AMBULATORY_CARE_PROVIDER_SITE_OTHER): Admitting: Podiatry

## 2024-02-02 ENCOUNTER — Other Ambulatory Visit: Payer: Self-pay

## 2024-02-02 DIAGNOSIS — E08621 Diabetes mellitus due to underlying condition with foot ulcer: Secondary | ICD-10-CM | POA: Diagnosis not present

## 2024-02-02 DIAGNOSIS — L97522 Non-pressure chronic ulcer of other part of left foot with fat layer exposed: Secondary | ICD-10-CM

## 2024-02-02 DIAGNOSIS — M67479 Ganglion, unspecified ankle and foot: Secondary | ICD-10-CM | POA: Diagnosis not present

## 2024-02-02 DIAGNOSIS — M67472 Ganglion, left ankle and foot: Secondary | ICD-10-CM | POA: Diagnosis not present

## 2024-02-03 ENCOUNTER — Other Ambulatory Visit: Payer: Self-pay

## 2024-02-06 ENCOUNTER — Other Ambulatory Visit: Payer: Self-pay

## 2024-02-06 NOTE — Progress Notes (Signed)
 Subjective: Chief Complaint  Patient presents with   Toe Pain    RM#13 Left toe pain in between the big toe.    58 year old female presents the office the above concerns.  She notes a spot forming the second toe.  She was started on antibiotics, doxycycline  for if she is still taking.  She noticed thick, clear like substance coming from the toe.  No increased swelling or redness.  No fevers or chills.  No injury.  Objective: AAO x3, NAD DP/PT pulses palpable bilaterally, CRT less than 3 seconds Ulceration still present distal portion of the hallux.  Hyperkeratotic tissue overlies the wound but after debridement the wound is still present measuring 0.5 x 0.4 x 0.1 cm.  Prior debridement it was mostly covered with callus not able to measure the wound. There is a new lesion noted on the medial aspect of the second toe on the IPJ.  There is no significant fluid identified today there is no purulence.  Slight edema.  Her symptoms are more consistent with a cyst type fluid.  There is no fluctuation or crepitation today.  There is no malodor. No pain with calf compression, swelling, warmth, erythema  Assessment: Left hallux ulceration, hallux malleus; skin lesion, possible cyst left second toe  Plan: Left 2nd toe skin lesion, possible cyst -On the left second toe what appears to be area there was a blister or small cyst present but there is no definitive skin breakdown.  Monitor any reoccurrence.  Finish course of doxycycline .  Monitor any signs or symptoms infection or skin breakdown.  Left hallux ulceration  - Medically necessary wound debridement was performed today.  I sharply debrided the hyperkeratotic periwound as well as the ulceration remove nonviable, devitalized tissue in order to promote wound healing.  Debrided to healthy, granular tissue.  No significant blood loss.  She tolerated well any complications.  Antibiotic ointment applied followed by dressing.  Continue with daily dressing  changes with antibiotic ointment for now.  Pre and post wound measurements are noted above.  Not typical measure the wound prior to debridement as it was covered with callus.  Continue offloading.  I was going to switch to other dressings but now will get back to the antibiotic ointment as well as offloading at all times. -Continue surgical shoe for offloading. -Unfortunately risk of toe loss which we have discussed. -Vascular: She has palpable pulses and she healed the nail procedure site well.  Return in about 2 weeks (around 02/16/2024) for toe ulcer. X-ray next appointment   Charity Conch DPM

## 2024-02-09 ENCOUNTER — Ambulatory Visit

## 2024-02-15 ENCOUNTER — Other Ambulatory Visit: Payer: Self-pay

## 2024-02-16 ENCOUNTER — Other Ambulatory Visit: Payer: Self-pay

## 2024-02-17 ENCOUNTER — Other Ambulatory Visit: Payer: Self-pay

## 2024-02-17 ENCOUNTER — Ambulatory Visit (INDEPENDENT_AMBULATORY_CARE_PROVIDER_SITE_OTHER): Admitting: Podiatry

## 2024-02-17 ENCOUNTER — Ambulatory Visit (INDEPENDENT_AMBULATORY_CARE_PROVIDER_SITE_OTHER)

## 2024-02-17 DIAGNOSIS — M67479 Ganglion, unspecified ankle and foot: Secondary | ICD-10-CM | POA: Diagnosis not present

## 2024-02-17 DIAGNOSIS — L97522 Non-pressure chronic ulcer of other part of left foot with fat layer exposed: Secondary | ICD-10-CM

## 2024-02-17 DIAGNOSIS — M67472 Ganglion, left ankle and foot: Secondary | ICD-10-CM | POA: Diagnosis not present

## 2024-02-21 ENCOUNTER — Telehealth: Payer: Self-pay

## 2024-02-21 NOTE — Progress Notes (Signed)
 Subjective: Chief Complaint  Patient presents with   Foot Ulcer    RM#12 Left toe ulcer looks to be in good healing state.     58 year old female presents the office the above concerns.  States that she has been doing well.  Does not see any drainage or pus or increasing swelling or redness.  She does not report any fevers or chills or any other signs of infection.  She has no other concerns today.    She is currently still out of work for her knee.    Objective: AAO x3, NAD DP/PT pulses palpable bilaterally, CRT less than 3 seconds Ulceration still present distal portion of the hallux.  Hyperkeratotic lesion overlies the wound.  Once I debrided this there is still superficial area of skin breakdown measuring 0.2 x 0.2 x 0.1 cm but appears to be almost healed.  There is no surrounding erythema, ascending cellulitis.  There is no fluctuation, crepitation, malodor.  There is no open lesion noted on the left second toe or other areas. No pain with calf compression, swelling, warmth, erythema  Assessment: Left hallux ulceration, hallux malleus; skin lesion, possible cyst left second toe  Plan:  Radiology 3 views of the left foot were obtained.  No evidence of acute fracture.  Hardware intact from prior surgery.  There is no definitive cortical changes to suggest osteomyelitis.  Left 2nd toe skin lesion, possible cyst -Symptoms have resolved.  Monitoring signs or symptoms of infection or skin breakdown.  Left hallux ulceration  -Medically necessary wound debridement was performed today.  I sharply debrided the hyperkeratotic periwound as well as the ulceration remove nonviable, devitalized tissue in order to promote wound healing.  Debrided to healthy, granular tissue.  No significant blood loss.  She tolerated well any complications.  Not able to measure the wound prior to debridement posterior measurements are noted above.  Silvadene was applied followed by dressing.    Continue  offloading.  I was going to switch to other dressings but now will get back to the antibiotic ointment as well as offloading at all times. -Continue surgical shoe for offloading. -Unfortunately risk of toe loss which we have discussed. -Vascular: She has palpable pulses and she healed the nail procedure site well.  Return in about 4 weeks (around 03/16/2024) for toe ulcer left big toe.   Charity Conch DPM

## 2024-02-21 NOTE — Telephone Encounter (Signed)
 Patient was identified as falling into the [place] Measure - Diabetes.   Patient was: Appointment already scheduled for:  04/12/2024.

## 2024-02-22 NOTE — Progress Notes (Unsigned)
 Helen Perez

## 2024-02-23 ENCOUNTER — Encounter (HOSPITAL_COMMUNITY): Payer: Self-pay

## 2024-02-23 ENCOUNTER — Ambulatory Visit: Payer: Managed Care, Other (non HMO) | Admitting: Adult Health

## 2024-02-23 ENCOUNTER — Encounter: Payer: Self-pay | Admitting: Adult Health

## 2024-02-23 VITALS — BP 132/73 | HR 123 | Ht 67.5 in | Wt 220.6 lb

## 2024-02-23 DIAGNOSIS — G4733 Obstructive sleep apnea (adult) (pediatric): Secondary | ICD-10-CM | POA: Diagnosis not present

## 2024-02-23 NOTE — Patient Instructions (Signed)
Continue using CPAP nightly and greater than 4 hours each night Order sent for new supplies If your symptoms worsen or you develop new symptoms please let us know.

## 2024-02-23 NOTE — Progress Notes (Signed)
 PATIENT: Helen Perez DOB: 1966-09-10  REASON FOR VISIT: follow up HISTORY FROM: patient PRIMARY NEUROLOGIST: Dr. Omar Bibber  Chief Complaint  Patient presents with   Follow-up    Rm 19, alone.  Cpap follow up.  She has hard time using her cpap.       HISTORY OF PRESENT ILLNESS:   Today 02/23/24  Helen Perez is a 58 y.o. female with a history of obstructive sleep apnea on CPAP. Returns today for follow-up.  Patient reports that she has not been using her CPAP consistently because she has not been able to get new supplies.  She states that her DME company reported that they had reached out to her office to get a new order.  I do not see any notes reflecting that.  Her download is below     02/17/23: Helen Perez is a 58 y.o. female with a history of OSA on CPAP. Returns today for follow-up.  Reports that CPAP machine is working well for her.  She states that she does need a new mask as hers split.  She does feel well rested.  Denies significant daytime sleepiness or fatigue.  Download is below      REVIEW OF SYSTEMS: Out of a complete 14 system review of symptoms, the patient complains only of the following symptoms, and all other reviewed systems are negative.  ESS 9  ALLERGIES: Allergies  Allergen Reactions   Adhesive [Tape]     From dexcom   Latex Itching and Swelling   Other Dermatitis    FREESTYLE LIBRE SENSOR- cellulitis, wound on skin     HOME MEDICATIONS: Outpatient Medications Prior to Visit  Medication Sig Dispense Refill   acetaminophen  (TYLENOL ) 650 MG CR tablet Take 1 tablet by mouth as needed for pain or fever.     acyclovir ointment (ZOVIRAX) 5 % Apply 1 Application topically every 3 (three) hours.     AMBULATORY NON FORMULARY MEDICATION Continuous positive airway pressure (CPAP) machine set on AutoPAP (4-20 cmH2O), with all supplemental supplies as needed. 1 each 0   amitriptyline  (ELAVIL ) 100 MG tablet Take 1 tablet (100 mg total) by mouth at  bedtime. 90 tablet 1   aspirin EC 81 MG tablet Take 1 tablet by mouth daily.     atorvastatin  (LIPITOR) 40 MG tablet Take 1 tablet (40 mg total) by mouth daily. 90 tablet 3   buPROPion  (WELLBUTRIN  XL) 150 MG 24 hr tablet Take 1 tablet (150 mg total) by mouth every morning. Take with 300mg  dose to equal 450mg  daily 90 tablet 3   clindamycin  (CLEOCIN  T) 1 % SWAB Apply 1 Application topically 2 (two) times daily. Apply to the affected areas in the axilla and groin. 180 each 1   clotrimazole -betamethasone  (LOTRISONE ) cream APPLY TO THE AFFECTED AREA(S) 2 TIMES A DAY (Patient taking differently: Apply 1 Application topically 2 (two) times daily.) 45 g 2   Continuous Glucose Sensor (DEXCOM G7 SENSOR) MISC Use as directed 9 each 3   COSENTYX UNOREADY 300 MG/2ML SOAJ Inject 300 mg into the skin every 30 (thirty) days.     DENTA 5000 PLUS 1.1 % CREA dental cream Take 1 Application by mouth daily.     doxycycline  (VIBRA -TABS) 100 MG tablet Take 1 tablet (100 mg total) by mouth 2 (two) times daily. 14 tablet 0   fexofenadine-pseudoephedrine (ALLEGRA-D 24) 180-240 MG 24 hr tablet Take 1 tablet by mouth daily as needed (allergies).     furosemide  (LASIX ) 20 MG tablet Take  1 tablet (20 mg total) by mouth daily. See instructions 90 tablet 3   glipiZIDE  (GLUCOTROL ) 10 MG tablet Take 1 tablet (10 mg total) by mouth 2 (two) times daily before a meal. 180 tablet 3   insulin  glargine, 2 Unit Dial , (TOUJEO  MAX SOLOSTAR) 300 UNIT/ML Solostar Pen Inject 38 Units into the skin daily at 6 (six) AM. 15 mL 3   insulin  lispro (HUMALOG  KWIKPEN) 200 UNIT/ML KwikPen Inject 20 Units into the skin 3 (three) times daily. 30 mL 3   levocetirizine (XYZAL ) 5 MG tablet TAKE ONE TABLET BY MOUTH EVERY EVENING 30 tablet 1   losartan  (COZAAR ) 25 MG tablet Take 1 tablet (25 mg total) by mouth daily. 90 tablet 3   metoprolol  succinate (TOPROL -XL) 50 MG 24 hr tablet Take 1 tablet (50 mg total) by mouth daily. Take with or immediately following  a meal. 90 tablet 3   mupirocin  ointment (BACTROBAN ) 2 % Apply 1 Application topically 2 (two) times daily. 30 g 2   Ostomy Supplies (SKIN TAC ADHESIVE BARRIER WIPE) MISC 1 applicator by Does not apply route as directed. 50 each 6   rizatriptan  (MAXALT -MLT) 10 MG disintegrating tablet Take 1 tablet (10 mg total) by mouth as needed for migraine. May repeat in 2 hours if needed 10 tablet 3   spironolactone  (ALDACTONE ) 100 MG tablet Take 1 tablet (100 mg total) by mouth daily. 30 tablet 3   Sulfacetamide Sodium 10 % LIQD Apply topically.     tirzepatide  (MOUNJARO ) 12.5 MG/0.5ML Pen Inject 12.5 mg into the skin once a week. 6 mL 3   triamcinolone  cream (KENALOG ) 0.5 % Apply 1 Application topically 2 (two) times daily. To affected areas. 30 g 3   valACYclovir  (VALTREX ) 500 MG tablet Take 1 tablet (500 mg total) by mouth daily. 90 tablet 3   zolpidem  (AMBIEN  CR) 12.5 MG CR tablet TAKE ONE TABLET BY MOUTH AT BEDTIME AS NEEDED FOR SLEEP 30 tablet 4   doxycycline  (VIBRA -TABS) 100 MG tablet Take 1 tablet (100 mg total) by mouth 2 (two) times daily. 20 tablet 0   buPROPion  (WELLBUTRIN  XL) 300 MG 24 hr tablet Take 1 tablet (300 mg total) by mouth daily. Take with 150mg  dose to equal 450mg  daily (Patient not taking: Reported on 02/23/2024) 90 tablet 3   HYDROcodone -acetaminophen  (NORCO/VICODIN) 5-325 MG tablet Take 1 tablet by mouth every 8 (eight) hours as needed for moderate pain (pain score 4-6). (Patient not taking: Reported on 02/17/2024) 15 tablet 0   Vitamin D , Ergocalciferol , (DRISDOL ) 1.25 MG (50000 UNIT) CAPS capsule TAKE 1 CAPSULE BY MOUTH EVERY 7 DAYS (Patient taking differently: Take 50,000 Units by mouth every 7 (seven) days.) 2 capsule 0   No facility-administered medications prior to visit.    PAST MEDICAL HISTORY: Past Medical History:  Diagnosis Date   Anxiety    Depression    Diabetes mellitus without complication (HCC)    H/O syphilis    HSV-2 infection    Hypertension associated with  diabetes (HCC) 03/30/2018   [name], left, sequela 11/06/2018   MRI results available in care everywhere/[facility]     Renal insufficiency 10/13/2018   Sleep apnea    CPAP   Stroke [facility])     PAST SURGICAL HISTORY: Past Surgical History:  Procedure Laterality Date   fluid from knee Left    FOOT SURGERY Right    FOOT SURGERY Left    TOTAL ABDOMINAL HYSTERECTOMY      FAMILY HISTORY: Family History  Problem Relation Age of Onset   Diabetes Mother    Heart disease Mother    Heart failure Mother    Kidney failure Father    Diabetes Sister    Kidney failure Sister    Sleep apnea Sister    Other Brother        quadraplegia   Diabetes Brother    Kidney failure Brother    Sleep apnea Brother    Diabetes Brother    Diabetes Maternal Grandmother    Colon cancer Neg Hx    Esophageal cancer Neg Hx    Pancreatic cancer Neg Hx    Stomach cancer Neg Hx    Liver disease Neg Hx     SOCIAL HISTORY: Social History   Socioeconomic History   Marital status: Single    Spouse name: Not on file   Number of children: 1   Years of education: Not on file   Highest education level: Some college, no degree  Occupational History   Not on file  Tobacco Use   Smoking status: Former    Current packs/day: 0.00    Types: Cigarettes    Quit date: 07/27/2012    Years since quitting: 11.5   Smokeless tobacco: Never  Vaping Use   Vaping status: Never Used  Substance and Sexual Activity   Alcohol use: Yes    Comment: 2-3 drinks/month, wine liquor   Drug use: No   Sexual activity: Yes    Partners: Male    Birth control/protection: Surgical  Other Topics Concern   Not on file  Social History Narrative   Not on file   Social Drivers of Health   Financial Resource Strain: Low Risk  (10/16/2023)   Overall Financial Resource Strain (CARDIA)    Difficulty of Paying Living Expenses: Not hard at all  Food Insecurity: No Food Insecurity (10/16/2023)   Hunger Vital  Sign    Worried About Running Out of Food in the Last Year: Never true    Ran Out of Food in the Last Year: Never true  Transportation Needs: No Transportation Needs (10/16/2023)   PRAPARE - Administrator, Civil Service (Medical): No    Lack of Transportation (Non-Medical): No  Physical Activity: Unknown (10/16/2023)   Exercise Vital Sign    Days of Exercise per Week: 0 days    Minutes of Exercise per Session: Not on file  Stress: No Stress Concern Present (10/16/2023)   [employer] of Occupational Health - Occupational Stress Questionnaire    Feeling of Stress : Only a little  Social Connections: Moderately Integrated (10/16/2023)   Social Connection and Isolation Panel [NHANES]    Frequency of Communication with Friends and Family: More than three times a week    Frequency of Social Gatherings with Friends and Family: Never    Attends Religious Services: More than 4 times per year    Active Member of [employer] or Organizations: No    Attends Engineer, structural: More than 4 times per year    Marital Status: Never married  Intimate Partner Violence: Unknown (01/22/2022)   Received from [employer], [facility]   HITS    Physically Hurt: Not on file    Insult or Talk Down To: Not on file    Threaten Physical Harm: Not on file    Scream or Curse: Not on file      PHYSICAL EXAM  Vitals:   02/23/24 1355  BP: 132/73  Pulse: (!) 123  Weight: 220 lb 9.6 oz (100.1 kg)  Height: 5' 7.5" (1.715 m)   Body mass index is 34.04 kg/m.  Generalized: Well developed, in no acute distress  Chest: Lungs clear to auscultation bilaterally, tachycardia noted  Neurological examination  Mentation: Alert oriented to time, place, history taking. Follows all commands speech and language fluent Cranial nerve II-XII: Facial symmetry noted   DIAGNOSTIC DATA (LABS, IMAGING, TESTING) - I reviewed patient records, labs, notes, testing and imaging myself where  available.  Lab Results  Component Value Date   WBC 9.4 10/11/2023   HGB 11.0 (L) 10/11/2023   HCT 35.2 (L) 10/11/2023   MCV 85.6 10/11/2023   PLT 342 10/11/2023      Component Value Date/Time   NA 131 (L) 10/11/2023 1742   NA 138 06/21/2023 1018   K 4.1 10/11/2023 1742   CL 103 10/11/2023 1742   CO2 21 (L) 10/11/2023 1742   GLUCOSE 314 (H) 10/11/2023 1742   BUN 23 (H) 10/11/2023 1742   BUN 24 06/21/2023 1018   CREATININE 1.29 (H) 10/11/2023 1742   CREATININE 1.18 (H) 02/04/2021 0000   CALCIUM  8.6 (L) 10/11/2023 1742   PROT 7.8 06/21/2023 1018   ALBUMIN 4.2 06/21/2023 1018   AST 22 06/21/2023 1018   ALT 36 (H) 06/21/2023 1018   ALKPHOS 155 (H) 06/21/2023 1018   BILITOT 0.3 06/21/2023 1018   GFRNONAA 48 (L) 10/11/2023 1742   GFRNONAA 52 (L) 02/04/2021 0000   GFRAA 60 02/04/2021 0000   Lab Results  Component Value Date   CHOL 138 03/31/2023   HDL 51.30 03/31/2023   LDLCALC 53 03/31/2023   TRIG 167.0 (H) 03/31/2023   CHOLHDL 3 03/31/2023   Lab Results  Component Value Date   HGBA1C 8.6 (A) 01/09/2024   No results found for: "VITAMINB12" Lab Results  Component Value Date   TSH 0.69 11/22/2022      ASSESSMENT AND PLAN 58 y.o. year old female  has a past medical history of Anxiety, Depression, Diabetes mellitus without complication (HCC), H/O syphilis, HSV-2 infection, Hypertension associated with diabetes (HCC) (03/30/2018), [name], left, sequela (11/06/2018), Renal insufficiency (10/13/2018), Sleep apnea, and Stroke (HCC). here with:  OSA on CPAP  - CPAP compliance excellent - Good treatment of AHI  - Encourage patient to use CPAP nightly and > 4 hours each night -Patient's heart rate was elevated today.  Reports that she has seen cardiology in the past.  I reviewed the note and she is suppose to be taking metoprolol .  She states that she has not been taking that as she ran out and needs a new prescription.  Encouraged her to reach out to her PCP  today. - F/U in 1 year or sooner if needed    Clem Currier, MSN, NP-C 02/23/2024, 2:12 PM [facility] [address] [place], [place] [address] [phone]

## 2024-03-01 ENCOUNTER — Ambulatory Visit

## 2024-03-01 DIAGNOSIS — Z1231 Encounter for screening mammogram for malignant neoplasm of breast: Secondary | ICD-10-CM | POA: Diagnosis not present

## 2024-03-06 ENCOUNTER — Ambulatory Visit: Payer: Self-pay | Admitting: Medical-Surgical

## 2024-03-16 ENCOUNTER — Other Ambulatory Visit: Payer: Self-pay

## 2024-03-16 ENCOUNTER — Ambulatory Visit: Admitting: Podiatry

## 2024-03-16 DIAGNOSIS — M2032 Hallux varus (acquired), left foot: Secondary | ICD-10-CM | POA: Diagnosis not present

## 2024-03-16 DIAGNOSIS — L84 Corns and callosities: Secondary | ICD-10-CM

## 2024-03-16 NOTE — Progress Notes (Signed)
 Subjective: Chief Complaint  Patient presents with   Foot Ulcer    Patient is here for ulcer on left hallux.      57 year old female presents the office the above concerns.  States that she been doing well.  He has not seen increasing swelling or redness and she has not seen any drainage or pus.  She denies any fevers or chills.  She is still out of work for other unrelated issues.   Objective: AAO x3, NAD DP/PT pulses palpable bilaterally, CRT less than 3 seconds Hyperkeratotic tissue noted the distal aspect of the hallux on the left side.  Once I debrided this there is 1 small area of dried blood present which is preulcerative but there is no definitive skin breakdown identified today.  There is no surrounding erythema, ascending cellulitis.  No fluctuation or crepitation and there is no motor.  Digital contractures present.  No new ulcerations identified. No skin breakdown noted in the left second toe. No pain with calf compression, swelling, warmth, erythema  Assessment: Left hallux ulceration, hallux malleus  Plan:  Left hallux ulceration  -I sharply debrided the hyperkeratotic lesion.  There is no definitive skin breakdown identified today but the area is preulcerative.  Still keep the dressing on during the day and continue offloading.  She still is monitor closely for any signs or symptoms of infection and/or reoccurrence of the wound. -Glucose control -Monitor for any clinical signs or symptoms of infection and directed to call the office immediately should any occur or go to the ER.  Return in about 4 weeks (around 04/13/2024).  Charity Conch DPM

## 2024-03-30 ENCOUNTER — Other Ambulatory Visit: Payer: Self-pay

## 2024-04-02 ENCOUNTER — Other Ambulatory Visit: Payer: Self-pay

## 2024-04-12 ENCOUNTER — Ambulatory Visit: Admitting: Medical-Surgical

## 2024-04-12 ENCOUNTER — Encounter: Payer: Self-pay | Admitting: Medical-Surgical

## 2024-04-12 VITALS — BP 132/72 | HR 117 | Resp 20 | Ht 67.5 in | Wt 219.1 lb

## 2024-04-12 DIAGNOSIS — F324 Major depressive disorder, single episode, in partial remission: Secondary | ICD-10-CM

## 2024-04-12 DIAGNOSIS — R809 Proteinuria, unspecified: Secondary | ICD-10-CM

## 2024-04-12 DIAGNOSIS — G4700 Insomnia, unspecified: Secondary | ICD-10-CM

## 2024-04-12 DIAGNOSIS — E1129 Type 2 diabetes mellitus with other diabetic kidney complication: Secondary | ICD-10-CM

## 2024-04-12 DIAGNOSIS — E1142 Type 2 diabetes mellitus with diabetic polyneuropathy: Secondary | ICD-10-CM | POA: Diagnosis not present

## 2024-04-12 DIAGNOSIS — E1159 Type 2 diabetes mellitus with other circulatory complications: Secondary | ICD-10-CM | POA: Diagnosis not present

## 2024-04-12 DIAGNOSIS — Z794 Long term (current) use of insulin: Secondary | ICD-10-CM

## 2024-04-12 DIAGNOSIS — F419 Anxiety disorder, unspecified: Secondary | ICD-10-CM | POA: Diagnosis not present

## 2024-04-12 DIAGNOSIS — I152 Hypertension secondary to endocrine disorders: Secondary | ICD-10-CM

## 2024-04-12 MED ORDER — BUPROPION HCL ER (XL) 300 MG PO TB24: 300.0000 mg | ORAL_TABLET | Freq: Every day | ORAL | 3 refills | Status: AC

## 2024-04-12 MED ORDER — AMITRIPTYLINE HCL 100 MG PO TABS: 100.0000 mg | ORAL_TABLET | Freq: Every day | ORAL | 1 refills | Status: DC

## 2024-04-12 MED ORDER — ZOLPIDEM TARTRATE ER 12.5 MG PO TBCR: 12.5000 mg | EXTENDED_RELEASE_TABLET | Freq: Every evening | ORAL | 5 refills | Status: DC | PRN

## 2024-04-12 MED ORDER — BUPROPION HCL ER (XL) 150 MG PO TB24: 150.0000 mg | ORAL_TABLET | ORAL | 3 refills | Status: AC

## 2024-04-12 MED ORDER — VALACYCLOVIR HCL 500 MG PO TABS: 500.0000 mg | ORAL_TABLET | Freq: Every day | ORAL | 3 refills | Status: AC

## 2024-04-12 MED ORDER — LEVOCETIRIZINE DIHYDROCHLORIDE 5 MG PO TABS: 5.0000 mg | ORAL_TABLET | Freq: Every evening | ORAL | 1 refills | Status: AC

## 2024-04-12 MED ORDER — FUROSEMIDE 20 MG PO TABS: 20.0000 mg | ORAL_TABLET | Freq: Every day | ORAL | 3 refills | Status: AC

## 2024-04-12 NOTE — Progress Notes (Signed)
        Established patient visit  History, exam, impression, and plan:  1. Hypertension associated with diabetes (HCC) (Primary) Pleasant 58 year old female presenting today with a hx of HTN currently managed by Cardiology. BP well managed today. No concerning symptoms. Cardiopulmonary exam normal. No changes today.   2. Type 2 diabetes mellitus with diabetic polyneuropathy, with long-term current use of insulin  [facility]) Managed by endocrinology. Denies current needs.   3. Microalbuminuria due to type 2 diabetes mellitus (HCC) Hx of microalbuminuria and up to date on monitoring. Managed by endocrinology.   4. Anxiety 5. Major depressive disorder with single episode, in partial remission (HCC) Has been taking Amitriptyline  100mg  nightly and Wellbutrin  450mg  daily, tolerating both well without side effects. Feels the medication is working well overall. Denies SI/HI. Mood, affect, speech, cognition, and thought pattern all normal. Continue Amitriptyline   and Wellbutrin  as prescribed.  - amitriptyline  (ELAVIL ) 100 MG tablet; Take 1 tablet (100 mg total) by mouth at bedtime.  Dispense: 90 tablet; Refill: 1  6. Insomnia Has been treated long term with Ambien  Cr 12.5mg  nightly for persistent insomnia. Feels the medication is still working well and notes she is unable to sleep without it. No side effects or excessive grogginess. Continue Ambien  as prescribed.  Procedures performed this visit: None.  Return in about 6 months (around 10/12/2024) for chronic disease follow up.  __________________________________ Zada FREDRIK Palin, DNP, APRN, FNP-BC Primary Care and Sports Medicine [facility] [place]

## 2024-04-13 ENCOUNTER — Other Ambulatory Visit: Payer: Self-pay

## 2024-04-13 ENCOUNTER — Telehealth: Payer: Self-pay | Admitting: Podiatry

## 2024-04-13 ENCOUNTER — Ambulatory Visit: Admitting: Podiatry

## 2024-04-13 ENCOUNTER — Encounter: Payer: Self-pay | Admitting: Podiatry

## 2024-04-13 NOTE — Telephone Encounter (Signed)
 Patient missed today's appointment. Your first available is a month out.Is it okay to schedule her then? Patient stated she isn't having any issues with the toe.

## 2024-04-13 NOTE — Telephone Encounter (Signed)
 Patient was a no-show for her scheduled appointment today. She called after the missed appointment to ask if she could be seen later the same day. She was informed that she had already been marked as a no-show and would be billed the $50 no-show fee.  This is her second no-show this year.  When I attempted to reschedule her, the earliest available appointment was June 25th. Upon hearing this, the patient responded, "Oh no, that will not work. I will reach out to him myself. OK, goodbye," and then hung-up.  No follow-up scheduled at this time.

## 2024-04-15 NOTE — Telephone Encounter (Signed)
 Ok to reschedule

## 2024-04-17 ENCOUNTER — Ambulatory Visit: Admitting: Podiatry

## 2024-04-17 NOTE — Telephone Encounter (Signed)
 Patient was a no show for appt 04/17/2024 .

## 2024-04-25 ENCOUNTER — Other Ambulatory Visit: Payer: Self-pay

## 2024-05-01 ENCOUNTER — Encounter: Payer: Self-pay | Admitting: Podiatry

## 2024-05-01 ENCOUNTER — Ambulatory Visit (INDEPENDENT_AMBULATORY_CARE_PROVIDER_SITE_OTHER): Admitting: Podiatry

## 2024-05-01 VITALS — BP 138/82 | HR 77 | Temp 97.7°F | Resp 18 | Ht 67.5 in | Wt 219.0 lb

## 2024-05-01 DIAGNOSIS — M2031 Hallux varus (acquired), right foot: Secondary | ICD-10-CM

## 2024-05-01 DIAGNOSIS — M2032 Hallux varus (acquired), left foot: Secondary | ICD-10-CM | POA: Diagnosis not present

## 2024-05-03 ENCOUNTER — Other Ambulatory Visit: Payer: Self-pay | Admitting: Internal Medicine

## 2024-05-04 ENCOUNTER — Other Ambulatory Visit: Payer: Self-pay

## 2024-05-04 MED ORDER — DEXCOM G7 SENSOR MISC
1.0000 | 3 refills | Status: DC
Start: 2024-05-04 — End: 2024-06-25
  Filled 2024-05-04: qty 3, 30d supply, fill #0
  Filled 2024-05-20 – 2024-05-28 (×3): qty 3, 30d supply, fill #1
  Filled 2024-06-22 (×2): qty 3, 30d supply, fill #2

## 2024-05-04 NOTE — Telephone Encounter (Signed)
 Requested Prescriptions   Pending Prescriptions Disp Refills   Continuous Glucose Sensor (DEXCOM G7 SENSOR) MISC 9 each 3    Sig: Use as directed

## 2024-05-05 NOTE — Progress Notes (Signed)
 Subjective: Chief Complaint  Patient presents with   Callouses    Patient is here for wound check F/U, wound looks good and healed    58 year old female presents the office the above concerns.  States that she been doing well, and feels that the wounds of healed.  She does not report any new ulcerations or any injuries.  She is still out of work for unrelated issues.  Objective: AAO x3, NAD DP/PT pulses palpable bilaterally, CRT less than 3 seconds  There is no open lesion identified at this time there is no significant callus formation either.  There is no edema, erythema or signs of infection.  Digital contractures are present most notably hallux malleus.  She is also starting get a hallux malleus on the right foot but is more flexible.  No open lesions of the right foot. No skin breakdown noted in the left second toe. No pain with calf compression, swelling, warmth, erythema  Assessment: Left hallux ulceration- resolved, hallux malleus  Plan:  Left hallux ulceration  - At this time appears that the ulcer has resolved.  Offloading monitor any reoccurrence.  - Glucose control, daily foot inspection discussed. - Monitor for any clinical signs or symptoms of infection and directed to call the office immediately should any occur or go to the ER.  Return in about 3 months (around 08/01/2024) for diabetic foot exam.  Donnice JONELLE Fees DPM

## 2024-05-07 ENCOUNTER — Other Ambulatory Visit: Payer: Self-pay

## 2024-05-07 ENCOUNTER — Other Ambulatory Visit: Payer: Self-pay | Admitting: Orthopedic Surgery

## 2024-05-07 DIAGNOSIS — M25562 Pain in left knee: Secondary | ICD-10-CM

## 2024-05-10 ENCOUNTER — Encounter: Payer: Self-pay | Admitting: Medical-Surgical

## 2024-05-11 ENCOUNTER — Ambulatory Visit (INDEPENDENT_AMBULATORY_CARE_PROVIDER_SITE_OTHER): Admitting: Internal Medicine

## 2024-05-11 ENCOUNTER — Other Ambulatory Visit: Payer: Self-pay

## 2024-05-11 ENCOUNTER — Encounter: Payer: Self-pay | Admitting: Internal Medicine

## 2024-05-11 VITALS — BP 120/80 | HR 86 | Ht 67.5 in | Wt 222.0 lb

## 2024-05-11 DIAGNOSIS — E1142 Type 2 diabetes mellitus with diabetic polyneuropathy: Secondary | ICD-10-CM

## 2024-05-11 DIAGNOSIS — Z794 Long term (current) use of insulin: Secondary | ICD-10-CM | POA: Diagnosis not present

## 2024-05-11 DIAGNOSIS — E1165 Type 2 diabetes mellitus with hyperglycemia: Secondary | ICD-10-CM

## 2024-05-11 DIAGNOSIS — E1159 Type 2 diabetes mellitus with other circulatory complications: Secondary | ICD-10-CM | POA: Diagnosis not present

## 2024-05-11 DIAGNOSIS — E785 Hyperlipidemia, unspecified: Secondary | ICD-10-CM | POA: Diagnosis not present

## 2024-05-11 LAB — POCT GLYCOSYLATED HEMOGLOBIN (HGB A1C): Hemoglobin A1C: 8.4 % — AB (ref 4.0–5.6)

## 2024-05-11 MED ORDER — GLIPIZIDE 10 MG PO TABS
20.0000 mg | ORAL_TABLET | Freq: Two times a day (BID) | ORAL | 3 refills | Status: DC
  Filled 2024-05-11 – 2024-06-22 (×7): qty 360, 90d supply, fill #0

## 2024-05-11 MED ORDER — TOUJEO MAX SOLOSTAR 300 UNIT/ML ~~LOC~~ SOPN
30.0000 [IU] | PEN_INJECTOR | Freq: Every day | SUBCUTANEOUS | 3 refills | Status: DC
  Filled 2024-05-11: qty 6, 60d supply, fill #0
  Filled 2024-05-20 – 2024-06-22 (×3): qty 6, 60d supply, fill #1

## 2024-05-11 MED ORDER — FLUCONAZOLE 150 MG PO TABS: 150.0000 mg | ORAL_TABLET | Freq: Once | ORAL | 0 refills | Status: AC

## 2024-05-11 MED ORDER — TIRZEPATIDE 15 MG/0.5ML ~~LOC~~ SOAJ
15.0000 mg | SUBCUTANEOUS | 3 refills | Status: DC
Start: 2024-05-11 — End: 2024-06-25
  Filled 2024-05-11: qty 6, 84d supply, fill #0
  Filled 2024-05-20 – 2024-06-22 (×3): qty 6, 84d supply, fill #1

## 2024-05-11 NOTE — Patient Instructions (Addendum)
-   Increase glipizide  10 mg, 2 tablets before Breakfast and Supper  -Increase Mounjaro  15 mg weekly  -Decrease Toujeo  30 units daily     HOW TO TREAT LOW BLOOD SUGARS (Blood sugar LESS THAN 70 MG/DL) Please follow the RULE OF 15 for the treatment of hypoglycemia treatment (when your (blood sugars are less than 70 mg/dL)   STEP 1: Take 15 grams of carbohydrates when your blood sugar is low, which includes:  3-4 GLUCOSE TABS  OR 3-4 OZ OF JUICE OR REGULAR SODA OR ONE TUBE OF GLUCOSE GEL    STEP 2: RECHECK blood sugar in 15 MINUTES STEP 3: If your blood sugar is still low at the 15 minute recheck --> then, go back to STEP 1 and treat AGAIN with another 15 grams of carbohydrates.

## 2024-05-11 NOTE — Progress Notes (Signed)
 Name: [name]  Age/ Sex: 58 y.o., female   MRN/ DOB: [id number], December 13, 1965     PCP: Willo Mini, NP   Reason for Endocrinology Evaluation: Type 2 Diabetes Mellitus  Initial Endocrine Consultative Visit: 01/30/2020    PATIENT IDENTIFIER: [name] is a 58 y.o. female with a past medical history of HTN, TIA, OSA and T2DM. The patient has followed with Endocrinology clinic since 01/30/2020 for consultative assistance with management of her diabetes.  DIABETIC HISTORY:  Helen Perez was diagnosed with T2DM in 2000. She reports intolerance to Metformin.  She has been on an insulin  pump for years.  Her hemoglobin A1c has ranged from 10.1% in 12/10/2019, peaking at 15.7% in 2016.  On her initial visit to our clinic her A1c was 12.5% , she was on Ozempic  and novolog  through insulin  pump. We did not make any changes as she  Uses the pump only periodically as well as the Decoxm. Chronic hx of non-compliance, and was given the option to switch to MDI vs using the pump properly , she opted to continue with the pump at the time.     By 04/2020 we stopped insulin  pump and started MDI regimen   Farxiga  started 10/2020 and stopped by 02/2021 due to worsening genital infections  We switched Ozempic  to Mounjaro  08/2022  Started glipizide  09/2023 as she was not consistent with   SUBJECTIVE:   During the last visit (01/09/2024): A1c 8.6% .     Today (05/11/2024): [name] is here for a follow up on diabetes  She checks her blood sugars multiple  times daily, through the dexcom. The patient has had hypoglycemic episodes since the last clinic visit.  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  Left hallux ulceration has resolved  She follows with cardiology for paroxysmal tachycardia, HTN, and dyslipidemia She follows with neurology for OSA   Denies fever  She has nausea last night but no vomiting  She has constipation    HOME  DIABETES REGIMEN:  - Glipizide  10 mg BID - Toujeo  38 units once daily  - Mounjaro  12.5 mg weekly - Humalog   (BG-130/20) TIDQAC - Losartan  25 mg daily     Statin: Yes ACE-I/ARB: yes     CONTINUOUS GLUCOSE MONITORING RECORD INTERPRETATION    Dates of Recording: 7/12-7/25/2025   Sensor description:Dexcom  Results statistics:   CGM use % of time 91%  Average and SD 196/93  Time in range 47  % Time Above 180 18  % Time above 250 30  % Time Below target 3       Glycemic patterns summary: BG's trend down overnight and increased throughout the day Hyperglycemic episodes postprandial  Hypoglycemic episodes occurred overnight  Overnight periods trends down      DIABETIC COMPLICATIONS: Microvascular complications:  Neuropathy Denies: retinopathy,  CKD Last eye exam: Completed 2021   Macrovascular complications:  CVA Denies: CAD, PVD    HISTORY:  Past Medical History:  Past Medical History:  Diagnosis Date   Anxiety    Depression    Diabetes mellitus without complication (HCC)    H/O syphilis    HSV-2 infection    Hypertension associated with diabetes (HCC) 03/30/2018   [name], left, sequela 11/06/2018   MRI results available in care everywhere/[facility]     Renal insufficiency 10/13/2018   Sleep apnea    CPAP   Stroke [facility])    Past Surgical  History:  Past Surgical History:  Procedure Laterality Date   fluid from knee Left    FOOT SURGERY Right    FOOT SURGERY Left    TOTAL ABDOMINAL HYSTERECTOMY     Social History:  reports that she quit smoking about 11 years ago. Her smoking use included cigarettes. She has never used smokeless tobacco. She reports current alcohol use. She reports that she does not use drugs. Family History:  Family History  Problem Relation Age of Onset   Diabetes Mother    Heart disease Mother    Heart failure Mother    Kidney failure Father    Diabetes Sister    Kidney failure Sister    Sleep  apnea Sister    Other Brother        quadraplegia   Diabetes Brother    Kidney failure Brother    Sleep apnea Brother    Diabetes Brother    Diabetes Maternal Grandmother    Colon cancer Neg Hx    Esophageal cancer Neg Hx    Pancreatic cancer Neg Hx    Stomach cancer Neg Hx    Liver disease Neg Hx      HOME MEDICATIONS: Allergies as of 05/11/2024       Reactions   Adhesive [tape]    From dexcom   Latex Itching, Swelling   Other Dermatitis   FREESTYLE LIBRE SENSOR- cellulitis, wound on skin         Medication List        Accurate as of May 11, 2024  1:27 PM. If you have any questions, ask your nurse or doctor.          acetaminophen  650 MG CR tablet Commonly known as: TYLENOL  Take 1 tablet by mouth as needed for pain or fever.   acyclovir ointment 5 % Commonly known as: ZOVIRAX Apply 1 Application topically every 3 (three) hours.   AMBULATORY NON FORMULARY MEDICATION Continuous positive airway pressure (CPAP) machine set on AutoPAP (4-20 cmH2O), with all supplemental supplies as needed.   amitriptyline  100 MG tablet Commonly known as: ELAVIL  Take 1 tablet (100 mg total) by mouth at bedtime.   aspirin EC 81 MG tablet Take 1 tablet by mouth daily.   atorvastatin  40 MG tablet Commonly known as: LIPITOR Take 1 tablet (40 mg total) by mouth daily.   buPROPion  150 MG 24 hr tablet Commonly known as: Wellbutrin  XL Take 1 tablet (150 mg total) by mouth every morning. Take with 300mg  dose to equal 450mg  daily   buPROPion  300 MG 24 hr tablet Commonly known as: Wellbutrin  XL Take 1 tablet (300 mg total) by mouth daily. Take with 150mg  dose to equal 450mg  daily   clindamycin  1 % Swab Commonly known as: CLEOCIN  T Apply 1 Application topically 2 (two) times daily. Apply to the affected areas in the axilla and groin.   clotrimazole -betamethasone  cream Commonly known as: LOTRISONE  APPLY TO THE AFFECTED AREA(S) 2 TIMES A DAY What changed: See the new  instructions.   Cosentyx UnoReady 300 MG/2ML Soaj Generic drug: Secukinumab Inject 300 mg into the skin every 30 (thirty) days.   Denta 5000 Plus 1.1 % Crea dental cream Generic drug: sodium fluoride Take 1 Application by mouth daily.   Dexcom G7 Sensor Misc Use as directed   doxycycline  100 MG tablet Commonly known as: VIBRA -TABS Take 1 tablet (100 mg total) by mouth 2 (two) times daily.   fexofenadine-pseudoephedrine 180-240 MG 24 hr tablet Commonly known as: ALLEGRA-D 24  Take 1 tablet by mouth daily as needed (allergies).   fluconazole  150 MG tablet Commonly known as: DIFLUCAN  Take 1 tablet (150 mg total) by mouth once for 1 dose. Repeat dose 72 hours if yeast infection persists   furosemide  20 MG tablet Commonly known as: LASIX  Take 1 tablet (20 mg total) by mouth daily. Ok to increase to 40mg  daily for 3-5 days as needed for worsened swelling of the right foot.   glipiZIDE  10 MG tablet Commonly known as: GLUCOTROL  Take 1 tablet (10 mg total) by mouth 2 (two) times daily before a meal.   HumaLOG  KwikPen 200 UNIT/ML KwikPen Generic drug: insulin  lispro Inject 20 Units into the skin 3 (three) times daily.   levocetirizine 5 MG tablet Commonly known as: XYZAL  Take 1 tablet (5 mg total) by mouth every evening.   losartan  25 MG tablet Commonly known as: COZAAR  Take 1 tablet (25 mg total) by mouth daily.   metoprolol  succinate 50 MG 24 hr tablet Commonly known as: TOPROL -XL Take 1 tablet (50 mg total) by mouth daily. Take with or immediately following a meal.   Mounjaro  12.5 MG/0.5ML Pen Generic drug: tirzepatide  Inject 12.5 mg into the skin once a week.   mupirocin  ointment 2 % Commonly known as: BACTROBAN  Apply 1 Application topically 2 (two) times daily.   rizatriptan  10 MG disintegrating tablet Commonly known as: MAXALT -MLT Take 1 tablet (10 mg total) by mouth as needed for migraine. May repeat in 2 hours if needed   Skin Tac Adhesive Barrier Wipe  Misc 1 applicator by Does not apply route as directed.   spironolactone  100 MG tablet Commonly known as: ALDACTONE  Take 1 tablet (100 mg total) by mouth daily.   Sulfacetamide Sodium 10 % Liqd Apply topically.   Toujeo  Max SoloStar 300 UNIT/ML Solostar Pen Generic drug: insulin  glargine (2 Unit Dial ) Inject 38 Units into the skin daily at 6 (six) AM.   triamcinolone  cream 0.5 % Commonly known as: KENALOG  Apply 1 Application topically 2 (two) times daily. To affected areas.   valACYclovir  500 MG tablet Commonly known as: VALTREX  Take 1 tablet (500 mg total) by mouth daily.   zolpidem  12.5 MG CR tablet Commonly known as: AMBIEN  CR Take 1 tablet (12.5 mg total) by mouth at bedtime as needed. for sleep         OBJECTIVE:   Vital Signs: BP 120/80 (BP Location: Left Arm, Patient Position: Sitting, Cuff Size: Normal)   Pulse 86   Ht 5' 7.5 (1.715 m)   Wt 222 lb (100.7 kg)   BMI 34.26 kg/m   Wt Readings from Last 3 Encounters:  05/11/24 222 lb (100.7 kg)  05/01/24 219 lb (99.3 kg)  04/12/24 219 lb 1.3 oz (99.4 kg)   Exam: General: Pt appears well and is in NAD  Lungs: Clear with good BS bilat  Heart: RRR   LE: Left ankle swelling noted   Neuro: MS is good with appropriate affect, pt is alert and Ox3   Dm Foot Exam per podiatry 05/01/2024    DATA REVIEWED:  Lab Results  Component Value Date   HGBA1C 8.4 (A) 05/11/2024   HGBA1C 8.6 (A) 01/09/2024   HGBA1C 9.6 (A) 10/03/2023    Latest Reference Range & Units 10/11/23 17:42  Sodium 135 - 145 mmol/L 131 (L)  Potassium 3.5 - 5.1 mmol/L 4.1  Chloride 98 - 111 mmol/L 103  CO2 22 - 32 mmol/L 21 (L)  Glucose 70 - 99 mg/dL 685 (H)  BUN 6 - 20 mg/dL 23 (H)  Creatinine 9.55 - 1.00 mg/dL 8.70 (H)  Calcium  8.9 - 10.3 mg/dL 8.6 (L)  Anion gap 5 - 15  7  GFR, Estimated >60 mL/min 48 (L)  (L): Data is abnormally low (H): Data is abnormally high  Old records , labs and images have been reviewed.    ASSESSMENT /  PLAN / RECOMMENDATIONS:   1) Type 2 Diabetes Mellitus, poorly controlled , With neuropathic and macrovascular  complications - Most recent A1c of 8.4 %. Goal A1c < 7.0 %.     -A1c continues to trend down but remains above goal -Patient advised to take glipizide  15-20 minutes before first and last meal of the day, caution against hypoglycemia - Will increase glipizide  - Will also increase Mounjaro  - I will decrease Toujeo  due to hypoglycemia - She does not use Humalog , will remove - Historically, she has not been a successful user of insulin  pump technology nor the InPen  MEDICATIONS: - Increase glipizide  10 mg,2 tablet twice daily - Increase Mounjaro  15 mg weekly - Decrease Toujeo  30 units once daily    EDUCATION / INSTRUCTIONS: BG monitoring instructions: Patient is instructed to check her blood sugars 4 times a day, before meals and bedtime . Call [facility] if: BG persistently < 70  I reviewed the Rule of 15 for the treatment of hypoglycemia in detail with the patient. Literature supplied.    2) Diabetic complications:  Eye: Does not have known diabetic retinopathy.  Neuro/ Feet: Does have known diabetic peripheral neuropathy .  Renal: Patient does not have known baseline CKD. She   is  on an ACEI/ARB at present.    3)Dyslipidemia:  -LDL at goal -TG slightly elevated,in the past   Medication Continue atorvastatin  40 mg daily    4) CKD III/ Microalbuminuria   -GFR remains low -Patient recalls being on lisinopril, unknown reason for discontinuation -Encourage compliance with losartan   Medication Continue losartan  25 mg daily    F/U in 4 months    Signed electronically by: Stefano Redgie Butts, MD  [facility]  [facility] [address] [place] [place], [place] [address] Phone: [phone] FAX: [phone]   CC: Willo Mini, NP [address] [place] [address] Phone:  [phone]  Fax: [phone]  Return to Endocrinology clinic as below: Future Appointments  Date Time Provider Department Center  05/16/2024  6:30 PM [facility] MR 1 [facility]  08/02/2024  1:15 PM Gershon Donnice SAUNDERS, DPM [facility]  10/09/2024  2:00 PM Willo Mini, NP [facility] None  03/04/2025  1:30 PM Sherryl Bouchard, NP [facility] None

## 2024-05-15 ENCOUNTER — Other Ambulatory Visit (HOSPITAL_COMMUNITY): Payer: Self-pay

## 2024-05-16 ENCOUNTER — Other Ambulatory Visit: Payer: Self-pay

## 2024-05-16 ENCOUNTER — Ambulatory Visit
Admission: RE | Admit: 2024-05-16 | Discharge: 2024-05-16 | Disposition: A | Discharge status: Home / Self Care | Payer: Worker's Compensation | Source: Ambulatory Visit | Attending: Orthopedic Surgery | Admitting: Orthopedic Surgery

## 2024-05-16 DIAGNOSIS — M25562 Pain in left knee: Secondary | ICD-10-CM

## 2024-05-21 ENCOUNTER — Encounter: Payer: Self-pay | Admitting: Medical-Surgical

## 2024-05-21 ENCOUNTER — Ambulatory Visit (INDEPENDENT_AMBULATORY_CARE_PROVIDER_SITE_OTHER): Admitting: Medical-Surgical

## 2024-05-21 ENCOUNTER — Other Ambulatory Visit: Payer: Self-pay

## 2024-05-21 VITALS — BP 123/65 | HR 124 | Resp 20 | Ht 67.5 in | Wt 220.0 lb

## 2024-05-21 DIAGNOSIS — N898 Other specified noninflammatory disorders of vagina: Secondary | ICD-10-CM

## 2024-05-21 MED ORDER — FLUOCINOLONE ACETONIDE 0.01 % OT OIL: 5.0000 [drp] | TOPICAL_OIL | Freq: Two times a day (BID) | OTIC | 6 refills | Status: AC | PRN

## 2024-05-21 NOTE — Progress Notes (Signed)
        Established patient visit   History of Present Illness   Discussed the use of AI scribe software for clinical note transcription with the patient, who gave verbal consent to proceed.  History of Present Illness   Helen Perez is a 58 year old female who presents with vaginal discharge and itching in the right ear.  Vaginal discharge has been present for two weeks, characterized as thick with a fishy or mildew odor, and recently blood-tinged. She changes panty liners three to four times daily due to the volume. A single dose of Diflucan  was taken without significant improvement. She is not sexually active. Denies urinary symptoms including burning, frequency, urgency, fever, or chills.  Persistent itching in the right ear has been ongoing for a long time. Previous evaluation at an emergency facility did not identify a cause. The itching is constant and bothersome.      Physical Exam   Physical Exam Vitals reviewed.  Constitutional:      General: She is not in acute distress.    Appearance: Normal appearance.  HENT:     Head: Normocephalic and atraumatic.     Right Ear: Hearing, tympanic membrane and external ear normal.     Ears:     Comments: Right ear canal dry with pale skin, no erythema or otitis externa noted.  Cardiovascular:     Rate and Rhythm: Normal rate and regular rhythm.     Pulses: Normal pulses.     Heart sounds: Normal heart sounds. No murmur heard.    No friction rub. No gallop.  Pulmonary:     Effort: Pulmonary effort is normal. No respiratory distress.     Breath sounds: Normal breath sounds. No wheezing.  Skin:    General: Skin is warm and dry.  Neurological:     Mental Status: She is alert and oriented to person, place, and time.  Psychiatric:        Mood and Affect: Mood normal.        Behavior: Behavior normal.        Thought Content: Thought content normal.        Judgment: Judgment normal.     Assessment & Plan   Assessment and  Plan    Vaginal discharge with possible infection Vaginal discharge with fishy odor and blood-tinged. Differential includes bacterial vaginosis, yeast infection, trichomonas, ureaplasma, and mycoplasma. Previous Diflucan  ineffective. Prefers oral metronidazole  if bacterial vaginosis confirmed. - Perform wet prep for yeast, bacterial vaginosis, and trichomonas. - Order swab for mycoplasma and ureaplasma. - Check urine for abnormalities. - If bacterial vaginosis confirmed, prescribe oral metronidazole  (Flagyl ) and advise no alcohol during treatment.  Pruritus and dryness of right ear canal (possible atopic dermatitis) Chronic itching and dryness in right ear canal, possibly atopic dermatitis or eczema. No acute infection. - Prescribe fluocinolone  oil 5 drops in the affected ear canal twice daily for 7-14 days. - Advise two-week break after 14 days of use to prevent adverse effects.      Follow up   Return if symptoms worsen or fail to improve.  __________________________________ Zada FREDRIK Palin, DNP, APRN, FNP-BC Primary Care and Sports Medicine [facility]

## 2024-05-22 ENCOUNTER — Ambulatory Visit: Payer: Self-pay | Admitting: Medical-Surgical

## 2024-05-22 LAB — POCT URINALYSIS DIP (CLINITEK)
Bilirubin, UA: NEGATIVE
Glucose, UA: 250 mg/dL — AB
Ketones, POC UA: NEGATIVE mg/dL
Nitrite, UA: NEGATIVE
POC PROTEIN,UA: 30 — AB
Spec Grav, UA: 1.02 (ref 1.010–1.025)
Urobilinogen, UA: 0.2 U/dL
pH, UA: 6 (ref 5.0–8.0)

## 2024-05-22 LAB — WET PREP FOR TRICH, YEAST, CLUE
Clue Cell Exam: NEGATIVE
Trichomonas Exam: NEGATIVE
Yeast Exam: NEGATIVE

## 2024-05-22 NOTE — Addendum Note (Signed)
 Addended by: FANNY NIELS CROME on: 05/22/2024 04:54 PM   Modules accepted: Orders

## 2024-05-23 ENCOUNTER — Other Ambulatory Visit: Payer: Self-pay

## 2024-05-23 LAB — GENITAL MYCOPLASMAS NAA, SWAB
Mycoplasma genitalium NAA: NEGATIVE
Mycoplasma hominis NAA: NEGATIVE
Ureaplasma spp NAA: NEGATIVE

## 2024-05-24 ENCOUNTER — Other Ambulatory Visit: Payer: Self-pay

## 2024-05-24 LAB — GENITAL MYCOPLASMAS NAA, URINE
Mycoplasma genitalium NAA: NEGATIVE
Mycoplasma hominis NAA: NEGATIVE
Ureaplasma spp NAA: NEGATIVE

## 2024-05-24 LAB — URINE CULTURE

## 2024-05-24 NOTE — Addendum Note (Signed)
 Addended byBETHA WILLO MINI on: 05/24/2024 07:48 AM   Modules accepted: Orders

## 2024-05-29 ENCOUNTER — Other Ambulatory Visit: Payer: Self-pay

## 2024-06-01 ENCOUNTER — Other Ambulatory Visit: Payer: Self-pay

## 2024-06-07 ENCOUNTER — Other Ambulatory Visit: Payer: Self-pay

## 2024-06-07 ENCOUNTER — Encounter: Payer: Self-pay | Admitting: Medical-Surgical

## 2024-06-13 ENCOUNTER — Other Ambulatory Visit: Payer: Self-pay

## 2024-06-13 ENCOUNTER — Telehealth: Payer: Self-pay

## 2024-06-13 NOTE — Telephone Encounter (Signed)
 Patient was identified as falling into the [place] Measure - Diabetes.   Patient is currently follow by Endocrinology.

## 2024-06-20 ENCOUNTER — Other Ambulatory Visit: Payer: Self-pay

## 2024-06-22 ENCOUNTER — Other Ambulatory Visit (HOSPITAL_BASED_OUTPATIENT_CLINIC_OR_DEPARTMENT_OTHER): Payer: Self-pay

## 2024-06-22 ENCOUNTER — Other Ambulatory Visit: Payer: Self-pay

## 2024-06-25 ENCOUNTER — Other Ambulatory Visit: Payer: Self-pay

## 2024-06-25 MED ORDER — GLIPIZIDE 10 MG PO TABS: 20.0000 mg | ORAL_TABLET | Freq: Two times a day (BID) | ORAL | 3 refills | Status: DC

## 2024-06-25 MED ORDER — LOSARTAN POTASSIUM 25 MG PO TABS: 25.0000 mg | ORAL_TABLET | Freq: Every day | ORAL | 3 refills | Status: AC

## 2024-06-25 MED ORDER — TIRZEPATIDE 15 MG/0.5ML ~~LOC~~ SOAJ: 15.0000 mg | SUBCUTANEOUS | 3 refills | Status: DC

## 2024-06-25 MED ORDER — ATORVASTATIN CALCIUM 40 MG PO TABS: 40.0000 mg | ORAL_TABLET | Freq: Every day | ORAL | 3 refills | Status: AC

## 2024-06-25 MED ORDER — TOUJEO MAX SOLOSTAR 300 UNIT/ML ~~LOC~~ SOPN: 30.0000 [IU] | PEN_INJECTOR | Freq: Every day | SUBCUTANEOUS | 3 refills | Status: DC

## 2024-06-25 MED ORDER — DEXCOM G7 SENSOR MISC: 1.0000 | 3 refills | Status: DC

## 2024-06-25 NOTE — Telephone Encounter (Signed)
 Prescriptions have been sent to [facility] as requested by patient.

## 2024-07-09 ENCOUNTER — Encounter: Payer: Self-pay | Admitting: Internal Medicine

## 2024-07-09 ENCOUNTER — Encounter: Payer: Self-pay | Admitting: Medical-Surgical

## 2024-07-10 ENCOUNTER — Telehealth: Payer: Self-pay

## 2024-07-10 MED ORDER — DEXCOM G7 SENSOR MISC: Status: DC

## 2024-07-10 NOTE — Telephone Encounter (Signed)
 Patient will stop by and pick up two samples.

## 2024-07-11 NOTE — Telephone Encounter (Signed)
Patient came in to office today and picked up 2 samples of Dexcom G7 Sensors.

## 2024-07-19 ENCOUNTER — Other Ambulatory Visit: Payer: Self-pay

## 2024-07-19 MED ORDER — DEXCOM G7 SENSOR MISC: 1.0000 | 3 refills | Status: DC

## 2024-07-23 ENCOUNTER — Ambulatory Visit: Admitting: Obstetrics & Gynecology

## 2024-07-23 ENCOUNTER — Encounter: Payer: Self-pay | Admitting: Obstetrics & Gynecology

## 2024-07-23 ENCOUNTER — Other Ambulatory Visit (HOSPITAL_COMMUNITY)
Admission: RE | Admit: 2024-07-23 | Discharge: 2024-07-23 | Disposition: A | Discharge status: Home / Self Care | Source: Ambulatory Visit | Attending: Obstetrics & Gynecology | Admitting: Obstetrics & Gynecology

## 2024-07-23 VITALS — BP 125/82 | HR 105 | Ht 67.5 in | Wt 220.0 lb

## 2024-07-23 DIAGNOSIS — N898 Other specified noninflammatory disorders of vagina: Secondary | ICD-10-CM

## 2024-07-23 DIAGNOSIS — Z113 Encounter for screening for infections with a predominantly sexual mode of transmission: Secondary | ICD-10-CM | POA: Diagnosis not present

## 2024-07-23 DIAGNOSIS — S3141XS Laceration without foreign body of vagina and vulva, sequela: Secondary | ICD-10-CM

## 2024-07-23 NOTE — Progress Notes (Signed)
   Subjective:    Patient ID: Helen Perez, female    DOB: 02-23-1966, 58 y.o.   MRN: [id number]  HPI  Patient is a 58 year old female who presents for continued vaginal discharge.  Mycoplasma screen was negative.  A wet prep was negative for trichomoniasis yeast and clue cells on August 4.  She does have a history of Candida glabrata.  The wet prep did not  Discharge has been 3-4 months and thick.  Sometimes there is an order--not fishy.  It has occasional itching.. No burning.  Nothing makes it better or worse. She reports some stool incontinence at times.  When stool is loose she cna't hold it in.  She has had a work up and offered a colostomy.  Not sure if she has been offered interstim.   Pt has history of ?forceps delivery of 8 pound 11 ounce baby with a large obstetrical tear.  She is not sure the degree and there are no records available.   Review of Systems  Constitutional: Negative.   Respiratory: Negative.    Cardiovascular: Negative.   Gastrointestinal: Negative.        Stool incontinence  Genitourinary:  Positive for vaginal discharge. Negative for pelvic pain, vaginal bleeding and vaginal pain.       Feels like flatus passes through vagina and pushes out discharge       Objective:   Physical Exam Vitals reviewed.  Constitutional:      General: She is not in acute distress.    Appearance: She is well-developed.  HENT:     Head: Normocephalic and atraumatic.  Eyes:     Conjunctiva/sclera: Conjunctivae normal.  Cardiovascular:     Rate and Rhythm: Normal rate.  Pulmonary:     Effort: Pulmonary effort is normal.  Genitourinary:    Comments: Tanner V Vulva:  No lesion Vagina:  Pink, no lesions, no discharge, no blood--no fistual aprecaited on exam.  Uterus:  Surgically absent Right adnexa--non tender, no mass Left adnexa--non tender, no mass  Skin:    General: Skin is warm and dry.  Neurological:     Mental Status: She is alert and oriented to person, place,  and time.  Psychiatric:        Mood and Affect: Mood normal.    Vitals:   07/23/24 1402 07/23/24 1403 07/23/24 1412  BP: (!) 144/75 (!) 163/93 125/82  Pulse: (!) 116 (!) 111 (!) 105  Weight: 220 lb (99.8 kg)    Height: 5' 7.5 (1.715 m)          Assessment & Plan:  58 yo female with vaginal discharge and air in the vagina that ocours when passing flatus.   Check Aptima--last test at PCP was a wet prep. Given GI/GU symptoms--will refer to colorectal surgeon.  She has been worked up in the past and ?told she would need a colostomy to fix her symptoms? PCP to address hypertension

## 2024-07-24 LAB — CERVICOVAGINAL ANCILLARY ONLY
Bacterial Vaginitis (gardnerella): POSITIVE — AB
Candida Glabrata: POSITIVE — AB
Candida Vaginitis: NEGATIVE
Chlamydia: NEGATIVE
Comment: NEGATIVE
Comment: NEGATIVE
Comment: NEGATIVE
Comment: NEGATIVE
Comment: NEGATIVE
Comment: NORMAL
Neisseria Gonorrhea: NEGATIVE
Trichomonas: POSITIVE — AB

## 2024-07-30 ENCOUNTER — Ambulatory Visit: Payer: Self-pay | Admitting: Obstetrics & Gynecology

## 2024-07-30 MED ORDER — METRONIDAZOLE 500 MG PO TABS: 500.0000 mg | ORAL_TABLET | Freq: Two times a day (BID) | ORAL | 0 refills | Status: AC

## 2024-08-01 ENCOUNTER — Telehealth: Payer: Self-pay

## 2024-08-01 ENCOUNTER — Other Ambulatory Visit (HOSPITAL_COMMUNITY): Payer: Self-pay

## 2024-08-01 NOTE — Telephone Encounter (Signed)
 Pharmacy Patient Advocate Encounter   Received notification from Pt Calls Messages that prior authorization for Dexcom G7 sensor is required/requested.   Insurance verification completed.   The patient is insured through [employer].   Per test claim: PA required; PA submitted to above mentioned insurance via Latent Key/confirmation #/EOC [id number] Status is pending

## 2024-08-01 NOTE — Telephone Encounter (Signed)
 Dexcom needs PA

## 2024-08-02 ENCOUNTER — Encounter: Payer: Self-pay | Admitting: Podiatry

## 2024-08-02 ENCOUNTER — Ambulatory Visit: Admitting: Podiatry

## 2024-08-02 DIAGNOSIS — M79675 Pain in left toe(s): Secondary | ICD-10-CM | POA: Diagnosis not present

## 2024-08-02 DIAGNOSIS — M2031 Hallux varus (acquired), right foot: Secondary | ICD-10-CM

## 2024-08-02 DIAGNOSIS — M2032 Hallux varus (acquired), left foot: Secondary | ICD-10-CM

## 2024-08-02 DIAGNOSIS — M79674 Pain in right toe(s): Secondary | ICD-10-CM

## 2024-08-02 DIAGNOSIS — B351 Tinea unguium: Secondary | ICD-10-CM | POA: Diagnosis not present

## 2024-08-02 NOTE — Telephone Encounter (Signed)
 LM for pt to call and schedule an appt per Zada palin. MyChart message also sent

## 2024-08-02 NOTE — Telephone Encounter (Signed)
 Called patient and left a detailed voice mail message requesting a return call to schedule the evaluation asap.

## 2024-08-05 NOTE — Progress Notes (Signed)
 Subjective: Chief Complaint  Patient presents with   [facility]    Rm13 Diabetic foot care     58 year old female presents the office the above concerns.  States that she has been doing well.  She does not report any ulcerations.  Her nails are thick and elongated and she cannot trim them herself may cause discomfort.    She is concerned about the right big toe doing the same thing the left did where it started to curl.  She does not recall any injuries or open lesions.  Objective: AAO x3, NAD DP/PT pulses palpable bilaterally, CRT less than 3 seconds  Nails are hypertrophic, dystrophic, brittle, discolored, elongated 10. No surrounding redness or drainage. Tenderness nails 1-5 bilaterally. No open lesions or pre-ulcerative lesions are identified today. No ulcerations are present at this time Hallux malleus present bilaterally.  The right side is flexible with the left side is more rigid. No skin breakdown noted in the left second toe. No pain with calf compression, swelling, warmth, erythema  Assessment: Symptomatic onychomycosis, hallux malleus  Plan:  Symptomatic onychomycosis -Sharply debrided nails x 10 without any comfort reasons or bleeding  Hallux malleus -Continue shoes, support and offloading.  Discussed range of motion exercises for the toe given the right side is more lexical.  Monitor for any signs or symptoms of ulceration, callus formation.  Return in about 3 months (around 11/02/2024) for foot exam.  Donnice JONELLE Fees DPM

## 2024-08-06 ENCOUNTER — Telehealth: Payer: Self-pay | Admitting: Adult Health

## 2024-08-06 DIAGNOSIS — G4733 Obstructive sleep apnea (adult) (pediatric): Secondary | ICD-10-CM

## 2024-08-06 NOTE — Telephone Encounter (Signed)
 Pt is asking for travel cpap . Will place order forward to Megan,NP for approval Pt aware

## 2024-08-06 NOTE — Telephone Encounter (Signed)
 Pt called to request to speak to Nurse about getting a travel CPAP . Pt wants to know is there a way where she can get  a traveling CPAP Machine if possible .

## 2024-08-06 NOTE — Telephone Encounter (Signed)
 Patient Helen Perez  needs a PA.

## 2024-08-07 ENCOUNTER — Other Ambulatory Visit (HOSPITAL_COMMUNITY): Payer: Self-pay

## 2024-08-07 ENCOUNTER — Telehealth: Payer: Self-pay

## 2024-08-07 MED ORDER — LANTUS SOLOSTAR 100 UNIT/ML ~~LOC~~ SOPN: 30.0000 [IU] | PEN_INJECTOR | Freq: Every day | SUBCUTANEOUS | 6 refills | Status: AC

## 2024-08-07 NOTE — Telephone Encounter (Signed)
 Pharmacy Patient Advocate Encounter  Received notification from [employer] that Prior Authorization for Dexcom G7 sensor  has been APPROVED from 08-01-2024 to 08-01-2025   PA #/Case ID/Reference #: [id number]

## 2024-08-07 NOTE — Addendum Note (Signed)
 Addended by: SAM DONELL PARAS on: 08/07/2024 10:02 AM   Modules accepted: Orders

## 2024-08-07 NOTE — Telephone Encounter (Signed)
 Pharmacy Patient Advocate Encounter   Received notification from Pt Calls Messages that prior authorization for Toujeo  is required/requested.   Insurance verification completed.   The patient is insured through [employer].   Per test claim:  Lantus  is preferred by the insurance.  If suggested medication is appropriate, Please send in a new RX and discontinue this one. If not, please advise as to why it's not appropriate so that we may request a Prior Authorization. Please note, some preferred medications may still require a PA.  If the suggested medications have not been trialed and there are no contraindications to their use, the PA will not be submitted, as it will not be approved.

## 2024-08-09 NOTE — Telephone Encounter (Signed)
 [name], [name], [name], RN; [name], [place]; [name] We are able to provide a travel cpap, i have pulled the order and sending it for processing Thank you

## 2024-08-09 NOTE — Telephone Encounter (Signed)
 Spoke with patient about order for travel cpap machine. She said she has not talked with [facility] yet as to whether or not they can provide. I told her I would start by sending the order there. If they cannot provide, we will give her a copy of the prescription so she can purchase the machine online. Pt was in agreement and appreciative.   Order sent to [facility].

## 2024-08-13 NOTE — Telephone Encounter (Signed)
 Received notice from [facility] that they were cancelling the order for travel cpap as was too expensive for pt and pt wanted to cancel.  If pt decides later that will do they will be happy to renter the order and  coordinate care.

## 2024-08-15 ENCOUNTER — Other Ambulatory Visit (HOSPITAL_COMMUNITY): Payer: Self-pay

## 2024-08-15 ENCOUNTER — Telehealth: Payer: Self-pay

## 2024-08-15 NOTE — Telephone Encounter (Signed)
 Pharmacy Patient Advocate Encounter   Received notification from Patient Advice Request messages that prior authorization for Mounjaro  15MG /0.5ML auto-injectors is required/requested.   Insurance verification completed.   The patient is insured through [employer].   Per test claim: PA required; PA started via CoverMyMeds. KEY [id number] . Please see clinical question(s) below that I am not finding the answer to in their chart and advise.     I see pt has tried Ozempic , but I am unable to find documentation that she has tried a second preferred product

## 2024-08-28 ENCOUNTER — Telehealth: Payer: Self-pay | Admitting: Internal Medicine

## 2024-08-28 NOTE — Telephone Encounter (Signed)
 It looks like they are wanting her to try 2 of the preferred alternatives before they will cover the mounjaro . Alternatives include: Byetta, Trulicity, Victoza, and Ozempic . I see that she has used Ozempic  in the past, but I can not find record that she has used a second preferred alternative.

## 2024-08-28 NOTE — Telephone Encounter (Signed)
 Patient states that her insurance company declined the prior authorization for Mounjaro .    I am unable to find the documentation regarding the reason for decline.  I do see where the Dexcom was approved    Do have the reason why they declined Mounjaro ?    Thanks

## 2024-08-29 ENCOUNTER — Encounter: Payer: Self-pay | Admitting: Internal Medicine

## 2024-08-29 MED ORDER — TRULICITY 1.5 MG/0.5ML ~~LOC~~ SOAJ: 1.5000 mg | SUBCUTANEOUS | 3 refills | Status: DC

## 2024-09-03 ENCOUNTER — Other Ambulatory Visit (HOSPITAL_COMMUNITY): Payer: Self-pay

## 2024-09-03 ENCOUNTER — Telehealth: Payer: Self-pay

## 2024-09-03 NOTE — Telephone Encounter (Signed)
 Pharmacy Patient Advocate Encounter   Received notification from RX Request Messages that prior authorization for Trulicity 1.5MG /0.5ML auto-injectors is required/requested.   Insurance verification completed.   The patient is insured through [employer].   Per test claim: PA required; PA submitted to above mentioned insurance via Latent Key/confirmation #/E[facility] Status is pending

## 2024-09-05 NOTE — Telephone Encounter (Signed)
 Pharmacy Patient Advocate Encounter  Received notification from [employer] that Prior Authorization for Trulicity  1.5MG /0.5ML auto-injectors has been APPROVED from 09/03/2024 to 09/03/2025

## 2024-09-06 ENCOUNTER — Encounter: Payer: Self-pay | Admitting: Podiatry

## 2024-09-07 ENCOUNTER — Telehealth: Payer: Self-pay | Admitting: Podiatry

## 2024-09-07 ENCOUNTER — Other Ambulatory Visit: Payer: Self-pay | Admitting: Podiatry

## 2024-09-07 MED ORDER — DOXYCYCLINE HYCLATE 100 MG PO TABS
100.0000 mg | ORAL_TABLET | Freq: Two times a day (BID) | ORAL | 0 refills | Status: AC
Start: 2024-09-07 — End: ?

## 2024-09-07 NOTE — Telephone Encounter (Signed)
 Patient reports her wound has opened up again. Doxycyline sent. Can we schedule a follow-up? Thanks!

## 2024-09-07 NOTE — Telephone Encounter (Signed)
 Do you need to see her as soon as Monday?

## 2024-09-10 ENCOUNTER — Ambulatory Visit (INDEPENDENT_AMBULATORY_CARE_PROVIDER_SITE_OTHER)

## 2024-09-10 ENCOUNTER — Ambulatory Visit (INDEPENDENT_AMBULATORY_CARE_PROVIDER_SITE_OTHER): Admitting: Podiatry

## 2024-09-10 DIAGNOSIS — L97521 Non-pressure chronic ulcer of other part of left foot limited to breakdown of skin: Secondary | ICD-10-CM

## 2024-09-10 DIAGNOSIS — L97522 Non-pressure chronic ulcer of other part of left foot with fat layer exposed: Secondary | ICD-10-CM | POA: Diagnosis not present

## 2024-09-10 DIAGNOSIS — M2032 Hallux varus (acquired), left foot: Secondary | ICD-10-CM | POA: Diagnosis not present

## 2024-09-11 ENCOUNTER — Encounter: Payer: Self-pay | Admitting: Medical-Surgical

## 2024-09-12 NOTE — Progress Notes (Signed)
 Subjective: Chief Complaint  Patient presents with   Foot Ulcer    Rm14 Patient has an ulceration left hallux/ diabetic A52C 18   58 year old female presents the office today with concerns of recurrent ulceration left hallux.  She had a blister formed on her third toe which she recently drained but she noticed the blood spots at the tip of the toe.  No purulence.  No recent injuries or changes.  She does not report any fevers or chills.  She remains on antibiotics.  No other concerns at this time.   Objective: AAO x3, NAD DP/PT pulses palpable bilaterally, CRT less than 3 seconds As pictured below there is an old blister noted on the sulcus of the hallux and there is hyperkeratotic, dried blood tissue at the distal portion of the toe.  Upon debridement underlying skin intact as pictured below as well.  There is no surrounding erythema, ascending cellulitis but there is no drainage or pus otherwise.  No fluctuation or crepitation. No pain with calf compression, swelling, warmth, erythema         Assessment: 58 year old female with left hallux ulcer  Plan: -All treatment options discussed with the patient including all alternatives, risks, complications.  -X-rays obtained reviewed.  Multiple views obtained.  Digital contractures present.  There is no cortical changes to suggest osteomyelitis at this time. -The left #312 scalpel sharply debrided the hyperkeratotic tissue, ulcer without any complications or bleeding.  Recommended daily dressing changes with antibiotic ointment.  Continue offloading.  She was using an offloading pad to help hold the toe up but I think it was causing irritation so recommended discontinue this.  Mended surgical shoe which she was wearing today. -Patient encouraged to call the office with any questions, concerns, change in symptoms.   Return for toe ulcer in 2-3 weeks.  Donnice JONELLE Fees DPM

## 2024-09-18 ENCOUNTER — Other Ambulatory Visit

## 2024-09-18 ENCOUNTER — Telehealth: Payer: Self-pay

## 2024-09-18 ENCOUNTER — Other Ambulatory Visit (HOSPITAL_COMMUNITY): Payer: Self-pay

## 2024-09-18 ENCOUNTER — Ambulatory Visit: Admitting: Internal Medicine

## 2024-09-18 ENCOUNTER — Telehealth: Payer: Self-pay | Admitting: Internal Medicine

## 2024-09-18 ENCOUNTER — Encounter: Payer: Self-pay | Admitting: Internal Medicine

## 2024-09-18 VITALS — BP 148/88 | Ht 67.5 in | Wt 210.0 lb

## 2024-09-18 DIAGNOSIS — Z7985 Long-term (current) use of injectable non-insulin antidiabetic drugs: Secondary | ICD-10-CM

## 2024-09-18 DIAGNOSIS — Z7984 Long term (current) use of oral hypoglycemic drugs: Secondary | ICD-10-CM

## 2024-09-18 DIAGNOSIS — E1142 Type 2 diabetes mellitus with diabetic polyneuropathy: Secondary | ICD-10-CM

## 2024-09-18 DIAGNOSIS — Z794 Long term (current) use of insulin: Secondary | ICD-10-CM

## 2024-09-18 DIAGNOSIS — E1165 Type 2 diabetes mellitus with hyperglycemia: Secondary | ICD-10-CM

## 2024-09-18 DIAGNOSIS — R809 Proteinuria, unspecified: Secondary | ICD-10-CM

## 2024-09-18 DIAGNOSIS — E1122 Type 2 diabetes mellitus with diabetic chronic kidney disease: Secondary | ICD-10-CM

## 2024-09-18 DIAGNOSIS — N1831 Chronic kidney disease, stage 3a: Secondary | ICD-10-CM

## 2024-09-18 DIAGNOSIS — E1159 Type 2 diabetes mellitus with other circulatory complications: Secondary | ICD-10-CM

## 2024-09-18 DIAGNOSIS — F3289 Other specified depressive episodes: Secondary | ICD-10-CM

## 2024-09-18 LAB — POCT GLYCOSYLATED HEMOGLOBIN (HGB A1C): Hemoglobin A1C: 14.5 % — AB (ref 4.0–5.6)

## 2024-09-18 MED ORDER — TRULICITY 3 MG/0.5ML ~~LOC~~ SOAJ: 3.0000 mg | SUBCUTANEOUS | 3 refills | Status: AC

## 2024-09-18 MED ORDER — DEXCOM G7 SENSOR MISC: 1.0000 | 3 refills | Status: AC

## 2024-09-18 MED ORDER — OMNIPOD 5 G7 PODS (GEN 5) MISC: 1.0000 | 3 refills | Status: AC

## 2024-09-18 MED ORDER — INSULIN LISPRO 100 UNIT/ML IJ SOLN: INTRAMUSCULAR | 3 refills | Status: DC

## 2024-09-18 MED ORDER — OMNIPOD 5 G7 INTRO (GEN 5) KIT: 1.0000 | PACK | 0 refills | Status: AC

## 2024-09-18 NOTE — Patient Instructions (Signed)
-  Take glipizide  10 mg, 2 tablets before Breakfast and Supper  -Increase Trulicity   3 mg weekly  -Take Toujeo  30 units daily     HOW TO TREAT LOW BLOOD SUGARS (Blood sugar LESS THAN 70 MG/DL) Please follow the RULE OF 15 for the treatment of hypoglycemia treatment (when your (blood sugars are less than 70 mg/dL)   STEP 1: Take 15 grams of carbohydrates when your blood sugar is low, which includes:  3-4 GLUCOSE TABS  OR 3-4 OZ OF JUICE OR REGULAR SODA OR ONE TUBE OF GLUCOSE GEL    STEP 2: RECHECK blood sugar in 15 MINUTES STEP 3: If your blood sugar is still low at the 15 minute recheck --> then, go back to STEP 1 and treat AGAIN with another 15 grams of carbohydrates.

## 2024-09-18 NOTE — Progress Notes (Signed)
 Name: Helen Perez  Age/ Sex: 58 y.o., female   MRN/ DOB: [id number], 01-04-1966     PCP: Willo Mini, NP   Reason for Endocrinology Evaluation: Type 2 Diabetes Mellitus  Initial Endocrine Consultative Visit: 01/30/2020    PATIENT IDENTIFIER: Helen Perez is a 58 y.o. female with a past medical history of HTN, TIA, OSA and T2DM. The patient has followed with Endocrinology clinic since 01/30/2020 for consultative assistance with management of her diabetes.  DIABETIC HISTORY:  Helen Perez was diagnosed with T2DM in 2000. She reports intolerance to Metformin.  She has been on an insulin  pump for years.  Her hemoglobin A1c has ranged from 10.1% in 12/10/2019, peaking at 15.7% in 2016.  On her initial visit to our clinic her A1c was 12.5% , she was on Ozempic  and novolog  through insulin  pump. We did not make any changes as she  Uses the pump only periodically as well as the Decoxm. Chronic hx of non-compliance, and was given the option to switch to MDI vs using the pump properly , she opted to continue with the pump at the time.     By 04/2020 we stopped insulin  pump and started MDI regimen   Farxiga  started 10/2020 and stopped by 02/2021 due to worsening genital infections  We switched Ozempic  to Mounjaro  08/2022  Started glipizide  09/2023 as she was not consistent with    In 11,2025 her insurance declined paying for Mounjaro  and we had to try Trulicity   SUBJECTIVE:   During the last visit (05/11/2024): A1c 8.4% .     Today (09/18/2024): Helen Perez is here for a follow up on diabetes  She checks her blood sugars multiple  times daily, through the dexcom. The patient has had hypoglycemic episodes since the last clinic visit.  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    HOME DIABETES REGIMEN:  - Glipizide  10 mg, 2 tabs  BID- she takes 1 tablet twice daily  - Toujeo  32 units once daily  - Trulicity  1.5 mg weekly - Humalog   (BG-130/20) TIDQAC - Losartan  25 mg daily     Statin: Yes ACE-I/ARB: yes     CONTINUOUS GLUCOSE MONITORING RECORD INTERPRETATION    Dates of Recording: 10/17-10/30/2025   Sensor description:Dexcom  Results statistics:   CGM use % of time 89  Average and SD 223/83  Time in range 34  % Time Above 180 31  % Time above 250 34  % Time Below target 1    Glycemic patterns summary: BGs fluctuate throughout the day and night Hyperglycemic episodes postprandial  Hypoglycemic episodes occurred at variable times  Overnight periods variable without a pattern      DIABETIC COMPLICATIONS: Microvascular complications:  Neuropathy Denies: retinopathy,  CKD Last eye exam: Completed 2021   Macrovascular complications:  CVA Denies: CAD, PVD    HISTORY:  Past Medical History:  Past Medical History:  Diagnosis Date   Anxiety    Depression    Diabetes mellitus without complication (HCC)    H/O syphilis    HSV-2 infection    Hypertension associated with diabetes (HCC) 03/30/2018   [name], left, sequela 11/06/2018  MRI results available in care everywhere/[facility]     Renal insufficiency 10/13/2018   Sleep apnea    CPAP   Stroke [facility])    Past Surgical History:  Past Surgical History:  Procedure Laterality Date   fluid from knee Left    FOOT SURGERY Right    FOOT SURGERY Left    TOTAL ABDOMINAL HYSTERECTOMY     Social History:  reports that she quit smoking about 12 years ago. Her smoking use included cigarettes. She has never used smokeless tobacco. She reports that she does not currently use alcohol. She reports that she does not use drugs. Family History:  Family History   Problem Relation Age of Onset   Diabetes Mother    Heart disease Mother    Heart failure Mother    Kidney failure Father    Diabetes Sister    Kidney failure Sister    Sleep apnea Sister    Other Brother        quadraplegia   Diabetes Brother    Kidney failure Brother    Sleep apnea Brother    Diabetes Brother    Diabetes Maternal Grandmother    Colon cancer Neg Hx    Esophageal cancer Neg Hx    Pancreatic cancer Neg Hx    Stomach cancer Neg Hx    Liver disease Neg Hx      HOME MEDICATIONS: Allergies as of 09/18/2024       Reactions   Adhesive [tape]    From dexcom   Latex Itching, Swelling   Other Dermatitis   FREESTYLE LIBRE SENSOR- cellulitis, wound on skin         Medication List        Accurate as of September 18, 2024  1:42 PM. If you have any questions, ask your nurse or doctor.          acetaminophen  650 MG CR tablet Commonly known as: TYLENOL  Take 1 tablet by mouth as needed for pain or fever.   acyclovir ointment 5 % Commonly known as: ZOVIRAX Apply 1 Application topically every 3 (three) hours.   AMBULATORY NON FORMULARY MEDICATION Continuous positive airway pressure (CPAP) machine set on AutoPAP (4-20 cmH2O), with all supplemental supplies as needed.   amitriptyline  100 MG tablet Commonly known as: ELAVIL  Take 1 tablet (100 mg total) by mouth at bedtime.   aspirin EC 81 MG tablet Take 1 tablet by mouth daily.   atorvastatin  40 MG tablet Commonly known as: LIPITOR Take 1 tablet (40 mg total) by mouth daily.   buPROPion  150 MG 24 hr tablet Commonly known as: Wellbutrin  XL Take 1 tablet (150 mg total) by mouth every morning. Take with 300mg  dose to equal 450mg  daily   buPROPion  300 MG 24 hr tablet Commonly known as: Wellbutrin  XL Take 1 tablet (300 mg total) by mouth daily. Take with 150mg  dose to equal 450mg  daily   clindamycin  1 % Swab Commonly known as: CLEOCIN  T Apply 1 Application topically 2 (two) times daily. Apply to the  affected areas in the axilla and groin.   clotrimazole -betamethasone  cream Commonly known as: LOTRISONE  APPLY TO THE AFFECTED AREA(S) 2 TIMES A DAY   Cosentyx UnoReady 300 MG/2ML Soaj Generic drug: Secukinumab Inject 300 mg into the skin every 30 (thirty) days.   Denta 5000 Plus 1.1 % Crea dental cream Generic drug: sodium fluoride Take 1 Application by mouth daily.   Dexcom G7 Sensor Misc CHANGE SENSOR EVERY 10 DAY   Dexcom  G7 Sensor Misc 1 Device by Does not apply route as directed. Change every 10 days   doxycycline  100 MG tablet Commonly known as: VIBRA -TABS Take 1 tablet (100 mg total) by mouth 2 (two) times daily.   fexofenadine-pseudoephedrine 180-240 MG 24 hr tablet Commonly known as: ALLEGRA-D 24 Take 1 tablet by mouth daily as needed (allergies).   Fluocinolone  Acetonide 0.01 % Oil Place 5 drops in ear(s) 2 (two) times daily as needed.   furosemide  20 MG tablet Commonly known as: LASIX  Take 1 tablet (20 mg total) by mouth daily. Ok to increase to 40mg  daily for 3-5 days as needed for worsened swelling of the right foot.   glipiZIDE  10 MG tablet Commonly known as: GLUCOTROL  Take 2 tablets (20 mg total) by mouth 2 (two) times daily before a meal.   Lantus  SoloStar 100 UNIT/ML Solostar Pen Generic drug: insulin  glargine Inject 30 Units into the skin daily.   levocetirizine 5 MG tablet Commonly known as: XYZAL  Take 1 tablet (5 mg total) by mouth every evening.   losartan  25 MG tablet Commonly known as: COZAAR  Take 1 tablet (25 mg total) by mouth daily.   metoprolol  succinate 50 MG 24 hr tablet Commonly known as: TOPROL -XL Take 1 tablet (50 mg total) by mouth daily. Take with or immediately following a meal.   metroNIDAZOLE  500 MG tablet Commonly known as: FLAGYL  Take 1 tablet (500 mg total) by mouth 2 (two) times daily.   mupirocin  ointment 2 % Commonly known as: BACTROBAN  Apply 1 Application topically 2 (two) times daily.   rizatriptan  10 MG  disintegrating tablet Commonly known as: MAXALT -MLT Take 1 tablet (10 mg total) by mouth as needed for migraine. May repeat in 2 hours if needed   Skin Tac Adhesive Barrier Wipe Misc 1 applicator by Does not apply route as directed.   spironolactone  100 MG tablet Commonly known as: ALDACTONE  Take 1 tablet (100 mg total) by mouth daily.   Sulfacetamide Sodium 10 % Liqd Apply topically.   triamcinolone  cream 0.5 % Commonly known as: KENALOG  Apply 1 Application topically 2 (two) times daily. To affected areas.   Trulicity  1.5 MG/0.5ML Soaj Generic drug: Dulaglutide  Inject 1.5 mg into the skin once a week.   valACYclovir  500 MG tablet Commonly known as: VALTREX  Take 1 tablet (500 mg total) by mouth daily.   zolpidem  12.5 MG CR tablet Commonly known as: AMBIEN  CR Take 1 tablet (12.5 mg total) by mouth at bedtime as needed. for sleep         OBJECTIVE:   Vital Signs: BP (!) 148/88   Ht 5' 7.5 (1.715 m)   Wt 210 lb (95.3 kg)   BMI 32.41 kg/m   Wt Readings from Last 3 Encounters:  09/18/24 210 lb (95.3 kg)  07/23/24 220 lb (99.8 kg)  05/21/24 220 lb (99.8 kg)   Exam: General: Pt appears well and is in NAD  Lungs: Clear with good BS bilat  Heart: RRR   LE: Left ankle swelling noted   Neuro: MS is good with appropriate affect, pt is alert and Ox3   Dm Foot Exam per podiatry 09/10/2024    DATA REVIEWED:  Lab Results  Component Value Date   HGBA1C 8.4 (A) 05/11/2024   HGBA1C 8.6 (A) 01/09/2024   HGBA1C 9.6 (A) 10/03/2023    Latest Reference Range & Units 10/11/23 17:42  Sodium 135 - 145 mmol/L 131 (L)  Potassium 3.5 - 5.1 mmol/L 4.1  Chloride 98 - 111 mmol/L 103  CO2 22 - 32 mmol/L 21 (L)  Glucose 70 - 99 mg/dL 685 (H)  BUN 6 - 20 mg/dL 23 (H)  Creatinine 9.55 - 1.00 mg/dL 8.70 (H)  Calcium  8.9 - 10.3 mg/dL 8.6 (L)  Anion gap 5 - 15  7  GFR, Estimated >60 mL/min 48 (L)  (L): Data is abnormally low (H): Data is abnormally high  Old records , labs  and images have been reviewed.    ASSESSMENT / PLAN / RECOMMENDATIONS:   1) Type 2 Diabetes Mellitus, poorly controlled , With neuropathic and macrovascular, CKD III complications - Most recent A1c of 14.5 %. Goal A1c < 7.0 %.    -Her A1c has increased from 8.4% to 14.5%, this is a clear indication that she has not been taking her medications on a regular basis -Her Dexcom has stopped on 08/16/2024, and she has not had any glucose checks since then -She has been taking glipizide  intermittently, and when she takes it is less than previously prescribed - Her insurance stopped paying for Mounjaro , we had to switch to Trulicity  -Main barriers to diabetes of care is depression - She used to be on insulin  pump in the past, but she was not consistent with changing it and applying it we had to discontinue.  I did recommend the OmniPod as I am hoping this will be easier for her to keep up with - An urgent referral has been placed to our CDE for training - A message to obtain prior authorization to our prior authorization team has been sent - Patient was advised that she will need to discontinue glipizide  and Toujeo  once she is on the pump    MEDICATIONS: - Increase glipizide  10 mg,2 tablet twice daily - Increase Trulicity  3 mg weekly - Take Toujeo  30 units once daily    EDUCATION / INSTRUCTIONS: BG monitoring instructions: Patient is instructed to check her blood sugars 4 times a day, before meals and bedtime . Call [place] [facility] if: BG persistently < 70  I reviewed the Rule of 15 for the treatment of hypoglycemia in detail with the patient. Literature supplied.    2) Diabetic complications:  Eye: Does not have known diabetic retinopathy.  Neuro/ Feet: Does have known diabetic peripheral neuropathy .  Renal: Patient does not have known baseline CKD. She   is  on an ACEI/ARB at present.    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 [place] [place], [place] [address] Phone: [phone] FAX: [phone]   CC: Willo Mini, NP [address] [place] [address] Phone: [phone]  Fax: [phone]  Return to Endocrinology clinic as below: Future Appointments  Date Time Provider Department Center  10/05/2024 10:15 AM Gershon Donnice SAUNDERS, DPM [facility]  10/09/2024  2:00 PM Willo Mini, NP [facility] [place]  11/05/2024  1:15 PM Gershon Donnice SAUNDERS, DPM [facility]  03/04/2025  1:30 PM Sherryl Bouchard, NP [facility] None

## 2024-09-18 NOTE — Telephone Encounter (Signed)
 Pharmacy Patient Advocate Encounter   Received notification from Pt Calls Messages that prior authorization for Omnipod is required/requested.   Insurance verification completed.   The patient is insured through [employer].   Per test claim: The current 30 day co-pay is, $0.  No PA needed at this time. This test claim was processed through [facility]- copay amounts may vary at other pharmacies due to pharmacy/plan contracts, or as the patient moves through the different stages of their insurance plan.     Insurance will only cover 30 days per fill

## 2024-09-18 NOTE — Telephone Encounter (Signed)
 Pharmacy Patient Advocate Encounter   Received notification from Pt Calls Messages that prior authorization for Dexcom G7 sensor is required/requested.   Insurance verification completed.   The patient is insured through [employer].   Per test claim: Refill too soon. PA is not needed at this time. Medication was filled 09/09/24. Next eligible fill date is 10/02/24.

## 2024-09-18 NOTE — Telephone Encounter (Signed)
 Please proceed with authorization for OmniPod, and Dexcom   Thank you

## 2024-09-19 LAB — MICROALBUMIN / CREATININE URINE RATIO
Creatinine, Urine: 38 mg/dL (ref 20–275)
Microalb Creat Ratio: 179 mg/g{creat} — ABNORMAL HIGH (ref <30)
Microalb, Ur: 6.8 mg/dL

## 2024-09-20 ENCOUNTER — Ambulatory Visit: Payer: Self-pay | Admitting: Internal Medicine

## 2024-10-05 ENCOUNTER — Ambulatory Visit: Admitting: Podiatry

## 2024-10-08 ENCOUNTER — Encounter: Admitting: Nutrition

## 2024-10-09 ENCOUNTER — Ambulatory Visit: Admitting: Medical-Surgical

## 2024-10-09 ENCOUNTER — Encounter: Admitting: Nutrition

## 2024-10-16 ENCOUNTER — Ambulatory Visit: Admitting: Medical-Surgical

## 2024-10-16 ENCOUNTER — Encounter: Payer: Self-pay | Admitting: Medical-Surgical

## 2024-10-16 VITALS — BP 110/62 | HR 120 | Resp 20 | Ht 67.5 in | Wt 209.0 lb

## 2024-10-16 DIAGNOSIS — Z794 Long term (current) use of insulin: Secondary | ICD-10-CM | POA: Diagnosis not present

## 2024-10-16 DIAGNOSIS — I152 Hypertension secondary to endocrine disorders: Secondary | ICD-10-CM

## 2024-10-16 DIAGNOSIS — E1159 Type 2 diabetes mellitus with other circulatory complications: Secondary | ICD-10-CM | POA: Diagnosis not present

## 2024-10-16 DIAGNOSIS — F419 Anxiety disorder, unspecified: Secondary | ICD-10-CM | POA: Diagnosis not present

## 2024-10-16 DIAGNOSIS — E1142 Type 2 diabetes mellitus with diabetic polyneuropathy: Secondary | ICD-10-CM | POA: Diagnosis not present

## 2024-10-16 DIAGNOSIS — F324 Major depressive disorder, single episode, in partial remission: Secondary | ICD-10-CM

## 2024-10-16 DIAGNOSIS — Z23 Encounter for immunization: Secondary | ICD-10-CM

## 2024-10-16 LAB — POCT GLYCOSYLATED HEMOGLOBIN (HGB A1C)
HbA1c, POC (controlled diabetic range): 14.9 % — AB (ref 0.0–7.0)
Hemoglobin A1C: 14.9 % — AB (ref 4.0–5.6)

## 2024-10-16 MED ORDER — COVID-19 MRNA VAC-TRIS(PFIZER) 30 MCG/0.3ML IM SUSY: 0.3000 mL | PREFILLED_SYRINGE | Freq: Once | INTRAMUSCULAR | 0 refills | Status: DC

## 2024-10-16 MED ORDER — ZOLPIDEM TARTRATE ER 12.5 MG PO TBCR: 12.5000 mg | EXTENDED_RELEASE_TABLET | Freq: Every evening | ORAL | 5 refills | Status: AC | PRN

## 2024-10-16 MED ORDER — AMITRIPTYLINE HCL 100 MG PO TABS: 100.0000 mg | ORAL_TABLET | Freq: Every day | ORAL | 1 refills | Status: AC

## 2024-10-16 NOTE — Progress Notes (Unsigned)
 "       Established patient visit   History of Present Illness   Discussed the use of AI scribe software for clinical note transcription with the patient, who gave verbal consent to proceed.  History of Present Illness   Helen Perez is a 58 year old female with diabetes who presents for insulin  pump initiation.  Diabetes mellitus management and glycemic control - Poor glycemic control with A1c of 14.9 - Currently uses Trulicity , glipizide , Toujeo , and Humalog  with meals - Perceives Trulicity  as ineffective - Scheduled for insulin  pump initiation - Expresses concern regarding compatibility of insulin  pump and Dexcom with water activities due to plans to join [facility] for exercise - Recent change in insurance to [employer], now pursuing coverage for [employer] and diabetes equipment  Gait disturbance and lower extremity symptoms - Chronic balance problems with frequent falls - Drags foot while walking - No foot numbness - Gradual changes in foot appearance - Uses rubber toe support - Developed blister in area of toe support  Mood disturbance - Depressed mood - Desires psychiatric evaluation - Takes amitriptyline  100 mg nightly - Out of Wellbutrin  - Believes mood and stress contribute to poor blood sugar control  Recent genitourinary infections - Recently treated by OB-GYN for yeast infection, bacterial vaginosis, and trichomonas  Caregiver stress and social situation - 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, limiting ability to work - Has 90% disability to allow caregiving - Receives some help at home - Social situation described as stressful       Physical Exam   Physical Exam Assessment & Plan   Problem List Items Addressed This Visit       Cardiovascular and Mediastinum   Hypertension associated with diabetes (HCC) - Primary   Relevant Orders   CBC with Differential/Platelet   Comprehensive metabolic panel with GFR   Lipid panel   POCT HgB A1C (Completed)      Endocrine   Type 2 diabetes mellitus with diabetic polyneuropathy, with long-term current use of insulin  (HCC)   Relevant Medications   amitriptyline  (ELAVIL ) 100 MG tablet   zolpidem  (AMBIEN  CR) 12.5 MG CR tablet   Other Relevant Orders   POCT HgB A1C (Completed)     Other   Depression   Relevant Medications   amitriptyline  (ELAVIL ) 100 MG tablet   Other Relevant Orders   Ambulatory referral to Psychiatry   Other Visit Diagnoses       Anxiety       Relevant Medications   amitriptyline  (ELAVIL ) 100 MG tablet   Other Relevant Orders   Ambulatory referral to Psychiatry     Immunization due       Relevant Orders   Pneumococcal conjugate vaccine 20-valent (Prevnar 20) (Completed)   Flu vaccine trivalent PF, 6mos and older(Flulaval,Afluria,Fluarix,Fluzone) (Completed)   Pfizer Comirnaty Covid-19 Vaccine 93yrs and older (Completed)      Assessment and Plan    Type 2 diabetes mellitus with diabetic polyneuropathy Type 2 diabetes with poor glycemic control (A1c 14.9%). Current regimen includes Trulicity , glipizide , Toujeo , and Humalog . Reports Trulicity  inefficacy. Diabetic polyneuropathy causing balance issues and foot deformity, leading to falls. Anticipates improvement with insulin  pump. - Initiate insulin  pump. - Continue Trulicity , glipizide , Toujeo , and Humalog . - Encouraged waterproofing options for Dexcom for aquatic activities. - Ordered blood work for updated labs.  Major depressive disorder, single episode, in partial remission Major depressive disorder in partial remission. Currently on amitriptyline  100 mg at night, aiding sleep but not mood. Depression may  affect diabetes management. Previously on Wellbutrin , currently out of medication. Interested in psychiatric evaluation. - Referred to female psychiatrist for evaluation and management. - Checked Wellbutrin  availability at home; consider prescribing or holding until psychiatric evaluation.        Follow up    Return in about 6 months (around 04/16/2025) for chronic disease follow up. __________________________________ Zada FREDRIK Palin, DNP, APRN, FNP-BC Primary Care and Sports Medicine [facility] [place] "

## 2024-10-17 ENCOUNTER — Telehealth (HOSPITAL_BASED_OUTPATIENT_CLINIC_OR_DEPARTMENT_OTHER): Payer: Self-pay | Admitting: Family Medicine

## 2024-10-17 ENCOUNTER — Encounter: Admitting: Nutrition

## 2024-10-17 ENCOUNTER — Ambulatory Visit: Payer: Self-pay | Admitting: Medical-Surgical

## 2024-10-17 ENCOUNTER — Other Ambulatory Visit: Payer: Self-pay | Admitting: Internal Medicine

## 2024-10-17 DIAGNOSIS — Z794 Long term (current) use of insulin: Secondary | ICD-10-CM | POA: Insufficient documentation

## 2024-10-17 DIAGNOSIS — E1142 Type 2 diabetes mellitus with diabetic polyneuropathy: Secondary | ICD-10-CM | POA: Insufficient documentation

## 2024-10-17 LAB — CBC WITH DIFFERENTIAL/PLATELET
Basophils Absolute: 0 x10E3/uL (ref 0.0–0.2)
Basos: 0 %
EOS (ABSOLUTE): 0.1 x10E3/uL (ref 0.0–0.4)
Eos: 1 %
Hematocrit: 37.7 % (ref 34.0–46.6)
Hemoglobin: 11.1 g/dL (ref 11.1–15.9)
Immature Grans (Abs): 0 x10E3/uL (ref 0.0–0.1)
Immature Granulocytes: 0 %
Lymphocytes Absolute: 2.6 x10E3/uL (ref 0.7–3.1)
Lymphs: 27 %
MCH: 26 pg — ABNORMAL LOW (ref 26.6–33.0)
MCHC: 29.4 g/dL — ABNORMAL LOW (ref 31.5–35.7)
MCV: 88 fL (ref 79–97)
Monocytes Absolute: 0.4 x10E3/uL (ref 0.1–0.9)
Monocytes: 4 %
Neutrophils Absolute: 6.3 x10E3/uL (ref 1.4–7.0)
Neutrophils: 68 %
Platelets: 324 x10E3/uL (ref 150–450)
RBC: 4.27 x10E6/uL (ref 3.77–5.28)
RDW: 12.4 % (ref 11.7–15.4)
WBC: 9.5 x10E3/uL (ref 3.4–10.8)

## 2024-10-17 LAB — LIPID PANEL
Chol/HDL Ratio: 2.8 ratio (ref 0.0–4.4)
Cholesterol, Total: 119 mg/dL (ref 100–199)
HDL: 42 mg/dL
LDL Chol Calc (NIH): 49 mg/dL (ref 0–99)
Triglycerides: 164 mg/dL — ABNORMAL HIGH (ref 0–149)
VLDL Cholesterol Cal: 28 mg/dL (ref 5–40)

## 2024-10-17 LAB — COMPREHENSIVE METABOLIC PANEL WITH GFR
ALT: 39 IU/L — ABNORMAL HIGH (ref 0–32)
AST: 19 IU/L (ref 0–40)
Albumin: 4 g/dL (ref 3.8–4.9)
Alkaline Phosphatase: 235 IU/L — ABNORMAL HIGH (ref 49–135)
BUN/Creatinine Ratio: 16 (ref 9–23)
BUN: 25 mg/dL — ABNORMAL HIGH (ref 6–24)
Bilirubin Total: 0.5 mg/dL (ref 0.0–1.2)
CO2: 22 mmol/L (ref 20–29)
Calcium: 9.3 mg/dL (ref 8.7–10.2)
Chloride: 88 mmol/L — ABNORMAL LOW (ref 96–106)
Creatinine, Ser: 1.55 mg/dL — ABNORMAL HIGH (ref 0.57–1.00)
Globulin, Total: 3.6 g/dL (ref 1.5–4.5)
Glucose: 681 mg/dL (ref 70–99)
Potassium: 4.8 mmol/L (ref 3.5–5.2)
Sodium: 126 mmol/L — ABNORMAL LOW (ref 134–144)
Total Protein: 7.6 g/dL (ref 6.0–8.5)
eGFR: 38 mL/min/1.73 — ABNORMAL LOW

## 2024-10-17 MED ORDER — INSULIN LISPRO 100 UNIT/ML IJ SOLN: INTRAMUSCULAR | 3 refills | Status: AC

## 2024-10-17 NOTE — Telephone Encounter (Signed)
 Received call from after-hours nurse.  Notification of critical lab result with elevated blood glucose.  No other critical lab results communicated to after-hours nurse.  Chart reviewed, patient did have office visit yesterday at which time point-of-care A1c was completed and send out labs for CBC, CMP and lipid panel were also completed.  Point-of-care A1c notably elevated at 14.9%.  This was known about in the office and treatment plan discussing next apps to better control blood sugars. Unfortunately, do not have other labs to review at this time to assess for electrolyte derangements or other abnormalities.  Discussed with triage nurse and he will contact patient to assess current status as compared to yesterday when labs were drawn.  Will need to follow-up on remaining labs from yesterday once results received from [facility].

## 2024-10-22 NOTE — Progress Notes (Signed)
 Patient came in with her OmniPod 5 pods, saying this visit was for a pump training.  She took her Lantus  this morning, and blood sugar was 138.  She was given a OmniPod starter kit from the drug room, with Dr. Kris name on it.  Pump start orders were received by Dr. Sam.  She had not registered the pump, so we charged her PDM, and attempted to do this.  OmniPod help line was contacted to help with this, but she had put in the wrong telephone number when registering the PDM, and was not able to get a second verification code.  Help line was not able to fix this, saying they would call her back sometime later today to attempt to complete this.  We were not able to use the PDM or to start the app, because her PDM was not activated.  I tried calling Omni[employer]'s territory manager: Rockey, for help with this, but he did not return my call.  After 2 hours on the phone with Customer care, the appointment was rescheduled for next week.

## 2024-10-22 NOTE — Patient Instructions (Signed)
 Receive a call from [employer] help line, and finish registering your PDM

## 2024-10-23 ENCOUNTER — Encounter: Attending: Internal Medicine | Admitting: Nutrition

## 2024-10-23 DIAGNOSIS — E1142 Type 2 diabetes mellitus with diabetic polyneuropathy: Secondary | ICD-10-CM | POA: Insufficient documentation

## 2024-10-23 DIAGNOSIS — Z794 Long term (current) use of insulin: Secondary | ICD-10-CM | POA: Insufficient documentation

## 2024-10-23 NOTE — Progress Notes (Unsigned)
 Patient was trained on the use of the OmniPod 5 insulin  pump.  She is using the app on her phone as the controller.  Settings were put into the app by the patient per Dr. Kris orders:  Basal rate: 0/95u/hr, max basal: 1.9u/hr, I/C ratio: 1 with directions to take 8 grams with meals and 4 grams with snacks.  Target: 120 with correction over 150, and ISF: 30.  We reviewed what all of these settings mean, how/when to deliver a bolus, and how and when to do a correction bolus.  She reported good understanding of this.  She filled a pod with Novolog  insulin  and we discussed proper pod placement with reference to her dexcom sensor.  She gave herself a correction dose of 6u because her blood sugar was 313, not having eaten anything today.  Stressed the need to bolus for all snacks, unless treating a low blood sugar.  She agreed to do this.  We linked her sensor to the pod, and linked the Dexcom readings to [facility].  Her pump was linked to [employer] [facility]'s central:  LI  :[email or web address]   PW: [id number] We reviewed all topics on the trainer checklist and she signed this as indication good understanding.  She had no final questions.

## 2024-10-24 ENCOUNTER — Telehealth: Payer: Self-pay | Admitting: Nutrition

## 2024-10-24 NOTE — Patient Instructions (Addendum)
 Stop Glipizide  Bolus for all meals and snacks Change pod when empty-every 2 days Do a correction dose whenever blood sugar goes over 250 Call Dexcom help lineif questions or problems with sensor Call OmniPod help line if questions about your pump.

## 2024-10-24 NOTE — Telephone Encounter (Signed)
 LVM to call me to let me know how she is doing with her insulin  pump

## 2024-10-29 ENCOUNTER — Ambulatory Visit (INDEPENDENT_AMBULATORY_CARE_PROVIDER_SITE_OTHER): Admitting: Podiatry

## 2024-10-29 DIAGNOSIS — L97521 Non-pressure chronic ulcer of other part of left foot limited to breakdown of skin: Secondary | ICD-10-CM

## 2024-10-29 DIAGNOSIS — M2032 Hallux varus (acquired), left foot: Secondary | ICD-10-CM | POA: Diagnosis not present

## 2024-10-31 NOTE — Progress Notes (Signed)
 Subjective: Chief Complaint  Patient presents with   Foot Ulcer    Patient presents today for a follow up on diabetic ulcer , patient relates improvement since last medical visit denies any pain , swelling.     59 year old female presents the office today with concerns of recurrent ulceration left hallux.  She presents today for follow-up evaluation.  She states that she is doing better.  No pain although she does have neuropathy and no increase in swelling or redness.  Denies any drainage or pus.  No fevers or chills.  No other concerns.  Objective: AAO x3, NAD DP/PT pulses palpable bilaterally, CRT less than 3 seconds On the distal aspect the left hallux is a hyperkeratotic lesion with a small Mehta dried blood present underneath the callus.  Once I debrided this there is no definitive skin breakdown but the areas still preulcerative as there is still some dried blood present.  There is no drainage or pus.  No increase in edema, erythema.  There is no warmth.  No fluctuation or crepitation or malodor. Hallux malleus is present. No pain with calf compression, swelling, warmth, erythema   No images are attached to the encounter.    Assessment: 59 year old female with left hallux ulcer  Plan: -All treatment options discussed with the patient including all alternatives, risks, complications.  -Debrided hyperkeratotic tissue without any complications or bleeding.  Recommended still continue with a small amount of antibiotic ointment and a dressing daily and continue offloading at all times.  Monitor for any signs or symptoms of infection and/or worsening of the wound.  Return in about 4 weeks (around 11/26/2024) for toe ulcer.  Donnice JONELLE Fees DPM

## 2024-11-05 ENCOUNTER — Ambulatory Visit: Admitting: Podiatry

## 2024-11-07 ENCOUNTER — Encounter: Payer: Self-pay | Admitting: Medical-Surgical

## 2024-11-09 ENCOUNTER — Other Ambulatory Visit: Payer: Self-pay | Admitting: Medical-Surgical

## 2024-11-09 DIAGNOSIS — F419 Anxiety disorder, unspecified: Secondary | ICD-10-CM

## 2024-11-09 DIAGNOSIS — F324 Major depressive disorder, single episode, in partial remission: Secondary | ICD-10-CM

## 2024-11-12 ENCOUNTER — Ambulatory Visit: Admitting: Obstetrics & Gynecology

## 2024-11-19 ENCOUNTER — Ambulatory Visit: Admitting: Obstetrics & Gynecology

## 2024-11-22 ENCOUNTER — Encounter: Payer: Self-pay | Admitting: Medical-Surgical

## 2024-11-22 DIAGNOSIS — G8929 Other chronic pain: Secondary | ICD-10-CM

## 2024-11-26 ENCOUNTER — Ambulatory Visit: Admitting: Podiatry

## 2024-12-19 ENCOUNTER — Ambulatory Visit: Admitting: Obstetrics and Gynecology

## 2025-01-02 ENCOUNTER — Ambulatory Visit: Admitting: Internal Medicine

## 2025-03-04 ENCOUNTER — Ambulatory Visit: Admitting: Adult Health

## 2025-04-16 ENCOUNTER — Ambulatory Visit: Admitting: Medical-Surgical
